# Patient Record
Sex: Female | Born: 1945
Health system: Southern US, Community
[De-identification: ages and names within clinical notes are randomized; demographics above are authoritative.]

## PROBLEM LIST (undated history)

## (undated) ENCOUNTER — Emergency Department (HOSPITAL_COMMUNITY): Admission: EM | Payer: Medicare Other

## (undated) DIAGNOSIS — Z87442 Personal history of urinary calculi: Secondary | ICD-10-CM

## (undated) DIAGNOSIS — I10 Essential (primary) hypertension: Secondary | ICD-10-CM

## (undated) DIAGNOSIS — R0600 Dyspnea, unspecified: Secondary | ICD-10-CM

## (undated) DIAGNOSIS — F17200 Nicotine dependence, unspecified, uncomplicated: Secondary | ICD-10-CM

## (undated) HISTORY — DX: Essential (primary) hypertension: I10

## (undated) HISTORY — PX: COLONOSCOPY: SHX174

## (undated) HISTORY — DX: Nicotine dependence, unspecified, uncomplicated: F17.200

## (undated) HISTORY — PX: OTHER SURGICAL HISTORY: SHX169

## (undated) HISTORY — PX: PILONIDAL CYST EXCISION: SHX744

---

## 1998-04-28 ENCOUNTER — Ambulatory Visit (HOSPITAL_COMMUNITY): Admission: RE | Admit: 1998-04-28 | Discharge: 1998-04-28 | Payer: Self-pay | Admitting: Family Medicine

## 2001-03-03 ENCOUNTER — Ambulatory Visit (HOSPITAL_COMMUNITY): Admission: RE | Admit: 2001-03-03 | Discharge: 2001-03-03 | Payer: Self-pay | Admitting: Family Medicine

## 2001-03-03 ENCOUNTER — Encounter: Payer: Self-pay | Admitting: Family Medicine

## 2002-03-06 ENCOUNTER — Encounter: Payer: Self-pay | Admitting: Family Medicine

## 2002-03-06 ENCOUNTER — Ambulatory Visit (HOSPITAL_COMMUNITY): Admission: RE | Admit: 2002-03-06 | Discharge: 2002-03-06 | Payer: Self-pay | Admitting: Family Medicine

## 2002-03-13 ENCOUNTER — Other Ambulatory Visit: Admission: RE | Admit: 2002-03-13 | Discharge: 2002-03-13 | Payer: Self-pay | Admitting: Family Medicine

## 2010-08-19 ENCOUNTER — Encounter: Admission: RE | Admit: 2010-08-19 | Discharge: 2010-08-19 | Payer: Self-pay | Admitting: Family Medicine

## 2012-12-19 ENCOUNTER — Other Ambulatory Visit: Payer: Self-pay | Admitting: Family Medicine

## 2012-12-19 ENCOUNTER — Ambulatory Visit
Admission: RE | Admit: 2012-12-19 | Discharge: 2012-12-19 | Disposition: A | Discharge status: Home / Self Care | Payer: Medicare Other | Source: Ambulatory Visit | Attending: Family Medicine | Admitting: Family Medicine

## 2012-12-19 DIAGNOSIS — R109 Unspecified abdominal pain: Secondary | ICD-10-CM

## 2012-12-19 MED ORDER — IOHEXOL 300 MG/ML  SOLN
100.0000 mL | Freq: Once | INTRAMUSCULAR | Status: AC | PRN
  Administered 2012-12-19: 100 mL via INTRAVENOUS

## 2012-12-19 MED ORDER — IOHEXOL 300 MG/ML  SOLN
30.0000 mL | Freq: Once | INTRAMUSCULAR | Status: AC | PRN
  Administered 2012-12-19: 30 mL via ORAL

## 2012-12-22 ENCOUNTER — Other Ambulatory Visit: Payer: Medicare Other

## 2012-12-25 ENCOUNTER — Ambulatory Visit
Admission: RE | Admit: 2012-12-25 | Discharge: 2012-12-25 | Disposition: A | Discharge status: Home / Self Care | Payer: Medicare Other | Source: Ambulatory Visit | Attending: Family Medicine | Admitting: Family Medicine

## 2012-12-25 ENCOUNTER — Other Ambulatory Visit: Payer: Self-pay | Admitting: Family Medicine

## 2013-01-09 ENCOUNTER — Ambulatory Visit (HOSPITAL_COMMUNITY)
Admission: RE | Admit: 2013-01-09 | Discharge: 2013-01-09 | Disposition: A | Discharge status: Home / Self Care | Payer: Medicare Other | Source: Ambulatory Visit | Attending: Urology | Admitting: Urology

## 2013-01-09 ENCOUNTER — Other Ambulatory Visit (HOSPITAL_COMMUNITY): Payer: Self-pay | Admitting: Urology

## 2013-01-09 DIAGNOSIS — R9389 Abnormal findings on diagnostic imaging of other specified body structures: Secondary | ICD-10-CM | POA: Insufficient documentation

## 2013-01-09 DIAGNOSIS — N201 Calculus of ureter: Secondary | ICD-10-CM

## 2013-04-10 ENCOUNTER — Other Ambulatory Visit (HOSPITAL_COMMUNITY): Payer: Self-pay | Admitting: Family Medicine

## 2013-04-10 DIAGNOSIS — R911 Solitary pulmonary nodule: Secondary | ICD-10-CM

## 2013-04-16 ENCOUNTER — Encounter (HOSPITAL_COMMUNITY)
Admission: RE | Admit: 2013-04-16 | Discharge: 2013-04-16 | Disposition: A | Discharge status: Home / Self Care | Payer: Medicare Other | Source: Ambulatory Visit | Attending: Family Medicine | Admitting: Family Medicine

## 2013-04-16 DIAGNOSIS — R911 Solitary pulmonary nodule: Secondary | ICD-10-CM

## 2013-04-16 DIAGNOSIS — J984 Other disorders of lung: Secondary | ICD-10-CM | POA: Insufficient documentation

## 2013-04-16 MED ORDER — FLUDEOXYGLUCOSE F - 18 (FDG) INJECTION
15.8000 | Freq: Once | INTRAVENOUS | Status: AC | PRN
  Administered 2013-04-16: 15.8 via INTRAVENOUS

## 2014-03-05 ENCOUNTER — Other Ambulatory Visit: Payer: Self-pay | Admitting: Family Medicine

## 2014-03-05 DIAGNOSIS — R918 Other nonspecific abnormal finding of lung field: Secondary | ICD-10-CM

## 2014-03-07 ENCOUNTER — Other Ambulatory Visit: Payer: Self-pay | Admitting: Family Medicine

## 2014-03-07 ENCOUNTER — Ambulatory Visit
Admission: RE | Admit: 2014-03-07 | Discharge: 2014-03-07 | Disposition: A | Discharge status: Home / Self Care | Payer: Medicare Other | Source: Ambulatory Visit | Attending: Family Medicine | Admitting: Family Medicine

## 2014-03-07 ENCOUNTER — Other Ambulatory Visit: Payer: Medicare Other

## 2014-03-07 DIAGNOSIS — R918 Other nonspecific abnormal finding of lung field: Secondary | ICD-10-CM

## 2014-11-13 DIAGNOSIS — R312 Other microscopic hematuria: Secondary | ICD-10-CM | POA: Diagnosis not present

## 2014-11-13 DIAGNOSIS — N200 Calculus of kidney: Secondary | ICD-10-CM | POA: Diagnosis not present

## 2014-12-21 DIAGNOSIS — I10 Essential (primary) hypertension: Secondary | ICD-10-CM | POA: Diagnosis not present

## 2014-12-21 DIAGNOSIS — M545 Low back pain: Secondary | ICD-10-CM | POA: Diagnosis not present

## 2014-12-21 DIAGNOSIS — F064 Anxiety disorder due to known physiological condition: Secondary | ICD-10-CM | POA: Diagnosis not present

## 2014-12-21 DIAGNOSIS — E6609 Other obesity due to excess calories: Secondary | ICD-10-CM | POA: Diagnosis not present

## 2014-12-30 DIAGNOSIS — M546 Pain in thoracic spine: Secondary | ICD-10-CM | POA: Diagnosis not present

## 2014-12-30 DIAGNOSIS — M25561 Pain in right knee: Secondary | ICD-10-CM | POA: Diagnosis not present

## 2015-01-13 DIAGNOSIS — F064 Anxiety disorder due to known physiological condition: Secondary | ICD-10-CM | POA: Diagnosis not present

## 2015-01-13 DIAGNOSIS — E789 Disorder of lipoprotein metabolism, unspecified: Secondary | ICD-10-CM | POA: Diagnosis not present

## 2015-01-13 DIAGNOSIS — I10 Essential (primary) hypertension: Secondary | ICD-10-CM | POA: Diagnosis not present

## 2015-01-13 DIAGNOSIS — E6609 Other obesity due to excess calories: Secondary | ICD-10-CM | POA: Diagnosis not present

## 2015-04-04 DIAGNOSIS — D485 Neoplasm of uncertain behavior of skin: Secondary | ICD-10-CM | POA: Diagnosis not present

## 2015-04-04 DIAGNOSIS — L820 Inflamed seborrheic keratosis: Secondary | ICD-10-CM | POA: Diagnosis not present

## 2015-04-14 DIAGNOSIS — Z0000 Encounter for general adult medical examination without abnormal findings: Secondary | ICD-10-CM | POA: Diagnosis not present

## 2015-07-03 DIAGNOSIS — Z1231 Encounter for screening mammogram for malignant neoplasm of breast: Secondary | ICD-10-CM | POA: Diagnosis not present

## 2015-12-02 DIAGNOSIS — Z0000 Encounter for general adult medical examination without abnormal findings: Secondary | ICD-10-CM | POA: Diagnosis not present

## 2015-12-02 DIAGNOSIS — N200 Calculus of kidney: Secondary | ICD-10-CM | POA: Diagnosis not present

## 2015-12-11 DIAGNOSIS — I10 Essential (primary) hypertension: Secondary | ICD-10-CM | POA: Diagnosis not present

## 2015-12-11 DIAGNOSIS — E089 Diabetes mellitus due to underlying condition without complications: Secondary | ICD-10-CM | POA: Diagnosis not present

## 2015-12-12 DIAGNOSIS — E1322 Other specified diabetes mellitus with diabetic chronic kidney disease: Secondary | ICD-10-CM | POA: Diagnosis not present

## 2015-12-12 DIAGNOSIS — E782 Mixed hyperlipidemia: Secondary | ICD-10-CM | POA: Diagnosis not present

## 2015-12-12 DIAGNOSIS — I10 Essential (primary) hypertension: Secondary | ICD-10-CM | POA: Diagnosis not present

## 2015-12-15 DIAGNOSIS — E669 Obesity, unspecified: Secondary | ICD-10-CM | POA: Diagnosis not present

## 2015-12-15 DIAGNOSIS — I10 Essential (primary) hypertension: Secondary | ICD-10-CM | POA: Diagnosis not present

## 2015-12-26 DIAGNOSIS — I10 Essential (primary) hypertension: Secondary | ICD-10-CM | POA: Diagnosis not present

## 2016-04-14 DIAGNOSIS — E089 Diabetes mellitus due to underlying condition without complications: Secondary | ICD-10-CM | POA: Diagnosis not present

## 2016-04-14 DIAGNOSIS — Z0000 Encounter for general adult medical examination without abnormal findings: Secondary | ICD-10-CM | POA: Diagnosis not present

## 2016-04-14 DIAGNOSIS — I10 Essential (primary) hypertension: Secondary | ICD-10-CM | POA: Diagnosis not present

## 2016-05-24 DIAGNOSIS — N200 Calculus of kidney: Secondary | ICD-10-CM | POA: Diagnosis not present

## 2016-05-31 DIAGNOSIS — N200 Calculus of kidney: Secondary | ICD-10-CM | POA: Diagnosis not present

## 2016-07-01 DIAGNOSIS — H538 Other visual disturbances: Secondary | ICD-10-CM | POA: Diagnosis not present

## 2016-07-01 DIAGNOSIS — Z961 Presence of intraocular lens: Secondary | ICD-10-CM | POA: Diagnosis not present

## 2016-07-01 DIAGNOSIS — H2512 Age-related nuclear cataract, left eye: Secondary | ICD-10-CM | POA: Diagnosis not present

## 2016-08-25 DIAGNOSIS — Z23 Encounter for immunization: Secondary | ICD-10-CM | POA: Diagnosis not present

## 2016-08-25 DIAGNOSIS — I10 Essential (primary) hypertension: Secondary | ICD-10-CM | POA: Diagnosis not present

## 2016-08-25 DIAGNOSIS — E089 Diabetes mellitus due to underlying condition without complications: Secondary | ICD-10-CM | POA: Diagnosis not present

## 2016-09-22 DIAGNOSIS — Z1231 Encounter for screening mammogram for malignant neoplasm of breast: Secondary | ICD-10-CM | POA: Diagnosis not present

## 2016-09-22 DIAGNOSIS — Z803 Family history of malignant neoplasm of breast: Secondary | ICD-10-CM | POA: Diagnosis not present

## 2016-12-28 DIAGNOSIS — I10 Essential (primary) hypertension: Secondary | ICD-10-CM | POA: Diagnosis not present

## 2016-12-28 DIAGNOSIS — E7800 Pure hypercholesterolemia, unspecified: Secondary | ICD-10-CM | POA: Diagnosis not present

## 2016-12-28 DIAGNOSIS — E1122 Type 2 diabetes mellitus with diabetic chronic kidney disease: Secondary | ICD-10-CM | POA: Diagnosis not present

## 2016-12-28 DIAGNOSIS — E785 Hyperlipidemia, unspecified: Secondary | ICD-10-CM | POA: Diagnosis not present

## 2016-12-28 DIAGNOSIS — E1100 Type 2 diabetes mellitus with hyperosmolarity without nonketotic hyperglycemic-hyperosmolar coma (NKHHC): Secondary | ICD-10-CM | POA: Diagnosis not present

## 2016-12-28 DIAGNOSIS — Z0000 Encounter for general adult medical examination without abnormal findings: Secondary | ICD-10-CM | POA: Diagnosis not present

## 2017-01-19 ENCOUNTER — Ambulatory Visit (INDEPENDENT_AMBULATORY_CARE_PROVIDER_SITE_OTHER): Payer: Medicare Other

## 2017-01-19 ENCOUNTER — Ambulatory Visit (INDEPENDENT_AMBULATORY_CARE_PROVIDER_SITE_OTHER): Payer: Medicare Other | Admitting: Podiatry

## 2017-01-19 DIAGNOSIS — M2031 Hallux varus (acquired), right foot: Secondary | ICD-10-CM | POA: Diagnosis not present

## 2017-01-19 DIAGNOSIS — M779 Enthesopathy, unspecified: Secondary | ICD-10-CM

## 2017-01-19 DIAGNOSIS — L600 Ingrowing nail: Secondary | ICD-10-CM | POA: Diagnosis not present

## 2017-01-19 DIAGNOSIS — S92424A Nondisplaced fracture of distal phalanx of right great toe, initial encounter for closed fracture: Secondary | ICD-10-CM

## 2017-01-20 NOTE — Progress Notes (Signed)
Subjective:     Patient ID: Tami Shea, female   DOB: 04-03-1946, 71 y.o.   MRN: [id number]  HPI 71 year old female presents to the office today for concerns of her right big toe pain which has been ongoing for the last several weeks. She states that she fell in February of this year and since then she had some pain to the to the toe.  She was putting ice on it and that helped. The pain is much improved but still some residual pain. She discussed with her PCP and was told she had an ingrown toenail. Denies any surrounding redness, drainage, or other signs of infection. No other complaints today.   Review of Systems  All other systems reviewed and are negative.      Objective:   Physical Exam General: AAO x3, NAD  Dermatological: Incurvation of the medial nail border but there is no redness, drainage, signs of infection. No pain to the area. No open lesions.   Vascular: Dorsalis Pedis artery and Posterior Tibial artery pedal pulses are 2/4 bilateral with immedate capillary fill time. There is no pain with calf compression, swelling, warmth, erythema.   Neruologic: Grossly intact via light touch bilateral. Vibratory intact via tuning fork bilateral. Protective threshold with Semmes Wienstein monofilament intact to all pedal sites bilateral.  Musculoskeletal: There is mild hallux malleolus present to the right hallux. There is tenderness along the dorsal IPJ, mostly lateral. Trace edema. No erythema or increase in warmth. No other area of pinpoint tenderness.  Muscular strength 5/5 in all groups tested bilateral.  Gait: Unassisted, Nonantalgic.      Assessment:     71 year old female with right hallux possible avulsion fracture; asymptomatic ingrown toenail.     Plan:     -Treatment options discussed including all alternatives, risks, and complications -Etiology of symptoms were discussed -X-rays were obtained and reviewed with the patient. On the lateral view there does appear to be  a small avulsion fracture at the IPJ.  -At this time her symptoms are much improved. Recommended to continue with ice and a stiffer sole shoe.  -Medial nail border was debrided without complications or bleeding.  -RTC in 3-4 weeks if symptoms are not resolved or sooner if needed.   Celesta Gentile, DPM

## 2017-02-11 ENCOUNTER — Ambulatory Visit: Payer: Medicare Other | Admitting: Podiatry

## 2017-04-01 DIAGNOSIS — N200 Calculus of kidney: Secondary | ICD-10-CM | POA: Diagnosis not present

## 2017-04-08 DIAGNOSIS — N200 Calculus of kidney: Secondary | ICD-10-CM | POA: Diagnosis not present

## 2017-06-08 DIAGNOSIS — I10 Essential (primary) hypertension: Secondary | ICD-10-CM | POA: Diagnosis not present

## 2017-06-08 DIAGNOSIS — E785 Hyperlipidemia, unspecified: Secondary | ICD-10-CM | POA: Diagnosis not present

## 2017-06-08 DIAGNOSIS — E1100 Type 2 diabetes mellitus with hyperosmolarity without nonketotic hyperglycemic-hyperosmolar coma (NKHHC): Secondary | ICD-10-CM | POA: Diagnosis not present

## 2017-11-02 DIAGNOSIS — I10 Essential (primary) hypertension: Secondary | ICD-10-CM | POA: Diagnosis not present

## 2017-12-26 ENCOUNTER — Ambulatory Visit (INDEPENDENT_AMBULATORY_CARE_PROVIDER_SITE_OTHER): Payer: Medicare Other

## 2017-12-26 ENCOUNTER — Ambulatory Visit: Payer: Medicare Other | Admitting: Podiatry

## 2017-12-26 ENCOUNTER — Encounter: Payer: Self-pay | Admitting: Podiatry

## 2017-12-26 DIAGNOSIS — M795 Residual foreign body in soft tissue: Secondary | ICD-10-CM

## 2017-12-26 DIAGNOSIS — S90851A Superficial foreign body, right foot, initial encounter: Secondary | ICD-10-CM | POA: Diagnosis not present

## 2017-12-26 DIAGNOSIS — B351 Tinea unguium: Secondary | ICD-10-CM

## 2017-12-26 DIAGNOSIS — M79675 Pain in left toe(s): Secondary | ICD-10-CM | POA: Diagnosis not present

## 2017-12-26 DIAGNOSIS — M79674 Pain in right toe(s): Secondary | ICD-10-CM

## 2017-12-26 MED ORDER — CEPHALEXIN 500 MG PO CAPS: 500.0000 mg | ORAL_CAPSULE | Freq: Three times a day (TID) | ORAL | 0 refills | Status: DC

## 2017-12-26 NOTE — Patient Instructions (Signed)
Soak Instructions    [name] AFTER THE PROCEDURE  Place 1/4 cup of epsom salts in a quart of warm tap water.  Submerge your foot or feet with outer bandage intact for the initial soak; this will allow the bandage to become moist and wet for easy lift off.  Once you remove your bandage, continue to soak in the solution for 20 minutes.  This soak should be done twice a Tami Shea.  Next, remove your foot or feet from solution, blot dry the affected area and cover.  You may use a band aid large enough to cover the area or use gauze and tape.  Apply other medications to the area as directed by the doctor such as polysporin neosporin.  IF YOUR SKIN BECOMES IRRITATED WHILE USING THESE INSTRUCTIONS, IT IS OKAY TO SWITCH TO  WHITE VINEGAR AND WATER. Or you may use antibacterial soap and water to keep the toe clean  Monitor for any signs/symptoms of infection. Call the office immediately if any occur or go directly to the emergency room. Call with any questions/concerns.   

## 2017-12-27 NOTE — Progress Notes (Signed)
Subjective: 72 year old female presents the office today as an appointment for concerns of physical wounds in her left heel.  She states that a week ago she stepped on a broken piece of glass and she got a couple of pieces out and it felt better however over the weekend she started noticing increasing pain to the heel.  She follow-up with her primary care physician today and was referred.  She is not sure when her last tetanus was updated.  She denies any drainage or pus or swelling.  She also states that while she is at today's appointment her nails are thick and elongated cannot trim herself and they are causing discomfort.  Denies any redness or drainage or any swelling.  She has no other concerns today. Denies any systemic complaints such as fevers, chills, nausea, vomiting. No acute changes since last appointment, and no other complaints at this time.   Objective: AAO x3, NAD DP/PT pulses palpable bilaterally, CRT less than 3 seconds On the plantar aspect the left heel is evidence of a puncture wound unable to palpate a piece of glass.  There is trace edema there is no surrounding erythema, ascending cellulitis.  There is no fluctuation or crepitation.  There is no malodor.  Tenderness palpation to this area but no other areas of tenderness and no other areas of puncture wounds. Nails are hypertrophic, dystrophic, brittle, discolored, elongated 10. No surrounding redness or drainage. Tenderness nails 1-5 bilaterally. No open lesions or pre-ulcerative lesions are identified today.No pain with calf compression, swelling, warmth, erythema  Assessment: Foreign body left heel, symptomatic onychomycosis  Plan: -All treatment options discussed with the patient including all alternatives, risks, complications.  -X-rays were obtained and reviewed.  There is evidence of foreign body present to the plantar calcaneus appears to be superficial.  No evidence of acute fracture. -After verbal consent was  obtained I cleaned the area with Betadine.  I was easily able to identify a piece of glass.  Utilizing a 15 blade I was able to debride some of the hyperkeratotic tissue over the area.  The glass was then easily removed and measured approximately 0.4 cm.  There is no purulence or drainage.  The area was cleaned with alcohol and antibiotic ointment was applied followed by a bandage.  I want her to continue with this daily as well as Epsom salts soaks at home.  Also prescribed Keflex as a precaution.  Also recommended follow-up with primary care physician for updated tetanus if needed.  I will see her back in 2 weeks if needed or sooner if any issues are to arise.  Monitor for any signs or symptoms of infection.  She tolerated the procedure well any complications. -Nails sharp debrided x10 without any complications or bleeding -Patient encouraged to call the office with any questions, concerns, change in symptoms.   Trula Slade DPM

## 2018-01-09 ENCOUNTER — Ambulatory Visit: Payer: Medicare Other | Admitting: Podiatry

## 2018-01-31 DIAGNOSIS — R0981 Nasal congestion: Secondary | ICD-10-CM | POA: Diagnosis not present

## 2018-02-08 DIAGNOSIS — Z1231 Encounter for screening mammogram for malignant neoplasm of breast: Secondary | ICD-10-CM | POA: Diagnosis not present

## 2018-04-07 DIAGNOSIS — E1100 Type 2 diabetes mellitus with hyperosmolarity without nonketotic hyperglycemic-hyperosmolar coma (NKHHC): Secondary | ICD-10-CM | POA: Diagnosis not present

## 2018-04-07 DIAGNOSIS — E785 Hyperlipidemia, unspecified: Secondary | ICD-10-CM | POA: Diagnosis not present

## 2018-04-07 DIAGNOSIS — I10 Essential (primary) hypertension: Secondary | ICD-10-CM | POA: Diagnosis not present

## 2018-05-04 DIAGNOSIS — H538 Other visual disturbances: Secondary | ICD-10-CM | POA: Diagnosis not present

## 2018-05-04 DIAGNOSIS — Z961 Presence of intraocular lens: Secondary | ICD-10-CM | POA: Diagnosis not present

## 2018-05-04 DIAGNOSIS — H26491 Other secondary cataract, right eye: Secondary | ICD-10-CM | POA: Diagnosis not present

## 2018-05-04 DIAGNOSIS — H2512 Age-related nuclear cataract, left eye: Secondary | ICD-10-CM | POA: Diagnosis not present

## 2018-05-31 DIAGNOSIS — H538 Other visual disturbances: Secondary | ICD-10-CM | POA: Diagnosis not present

## 2018-05-31 DIAGNOSIS — H26491 Other secondary cataract, right eye: Secondary | ICD-10-CM | POA: Diagnosis not present

## 2018-05-31 DIAGNOSIS — Z961 Presence of intraocular lens: Secondary | ICD-10-CM | POA: Diagnosis not present

## 2018-05-31 DIAGNOSIS — H2512 Age-related nuclear cataract, left eye: Secondary | ICD-10-CM | POA: Diagnosis not present

## 2018-07-12 DIAGNOSIS — H26491 Other secondary cataract, right eye: Secondary | ICD-10-CM | POA: Diagnosis not present

## 2018-07-25 DIAGNOSIS — H2512 Age-related nuclear cataract, left eye: Secondary | ICD-10-CM | POA: Diagnosis not present

## 2018-09-14 DIAGNOSIS — I10 Essential (primary) hypertension: Secondary | ICD-10-CM | POA: Diagnosis not present

## 2018-09-14 DIAGNOSIS — E1100 Type 2 diabetes mellitus with hyperosmolarity without nonketotic hyperglycemic-hyperosmolar coma (NKHHC): Secondary | ICD-10-CM | POA: Diagnosis not present

## 2018-09-14 DIAGNOSIS — E782 Mixed hyperlipidemia: Secondary | ICD-10-CM | POA: Diagnosis not present

## 2018-09-15 ENCOUNTER — Other Ambulatory Visit: Payer: Self-pay

## 2018-09-15 NOTE — Patient Outreach (Signed)
[facility]) Care Management  09/15/2018  Jerita Wimbush Balla 24-Feb-1946 [id number]   Medication Adherence call to Mrs. Anjulie Mounger spoke with patient she is due on Rosuvastatin 20 mg, she had a doctor's appointment yesterday and doctor told patient she will send in a refill request to [facility] after the appointment .[facility] has not received a refill request ,call doctor's office and left a message for patient to call back patient was inform. Mrs. Bushway is showing past due under Christine.    [facility] Direct Dial [phone]  Fax [phone] [email or web address]

## 2019-01-01 DIAGNOSIS — I10 Essential (primary) hypertension: Secondary | ICD-10-CM | POA: Diagnosis not present

## 2019-01-01 DIAGNOSIS — Z0000 Encounter for general adult medical examination without abnormal findings: Secondary | ICD-10-CM | POA: Diagnosis not present

## 2019-01-01 DIAGNOSIS — J399 Disease of upper respiratory tract, unspecified: Secondary | ICD-10-CM | POA: Diagnosis not present

## 2019-01-01 DIAGNOSIS — R0981 Nasal congestion: Secondary | ICD-10-CM | POA: Diagnosis not present

## 2019-07-09 DIAGNOSIS — I10 Essential (primary) hypertension: Secondary | ICD-10-CM | POA: Diagnosis not present

## 2019-07-09 DIAGNOSIS — E1169 Type 2 diabetes mellitus with other specified complication: Secondary | ICD-10-CM | POA: Diagnosis not present

## 2019-08-10 DIAGNOSIS — Z803 Family history of malignant neoplasm of breast: Secondary | ICD-10-CM | POA: Diagnosis not present

## 2019-08-10 DIAGNOSIS — Z1231 Encounter for screening mammogram for malignant neoplasm of breast: Secondary | ICD-10-CM | POA: Diagnosis not present

## 2019-10-08 DIAGNOSIS — E785 Hyperlipidemia, unspecified: Secondary | ICD-10-CM | POA: Diagnosis not present

## 2019-10-08 DIAGNOSIS — Z23 Encounter for immunization: Secondary | ICD-10-CM | POA: Diagnosis not present

## 2019-10-08 DIAGNOSIS — I10 Essential (primary) hypertension: Secondary | ICD-10-CM | POA: Diagnosis not present

## 2019-10-08 DIAGNOSIS — M130 Polyarthritis, unspecified: Secondary | ICD-10-CM | POA: Diagnosis not present

## 2019-10-15 DIAGNOSIS — H6122 Impacted cerumen, left ear: Secondary | ICD-10-CM | POA: Diagnosis not present

## 2019-11-05 DIAGNOSIS — K5900 Constipation, unspecified: Secondary | ICD-10-CM | POA: Diagnosis not present

## 2019-11-05 DIAGNOSIS — I10 Essential (primary) hypertension: Secondary | ICD-10-CM | POA: Diagnosis not present

## 2019-11-05 DIAGNOSIS — Z7189 Other specified counseling: Secondary | ICD-10-CM | POA: Diagnosis not present

## 2019-11-05 DIAGNOSIS — R10811 Right upper quadrant abdominal tenderness: Secondary | ICD-10-CM | POA: Diagnosis not present

## 2019-11-12 ENCOUNTER — Other Ambulatory Visit: Payer: Self-pay | Admitting: Nurse Practitioner

## 2019-11-12 DIAGNOSIS — R10811 Right upper quadrant abdominal tenderness: Secondary | ICD-10-CM

## 2019-11-12 DIAGNOSIS — K5900 Constipation, unspecified: Secondary | ICD-10-CM

## 2019-11-19 DIAGNOSIS — I10 Essential (primary) hypertension: Secondary | ICD-10-CM | POA: Diagnosis not present

## 2019-11-21 ENCOUNTER — Ambulatory Visit
Admission: RE | Admit: 2019-11-21 | Discharge: 2019-11-21 | Disposition: A | Discharge status: Home / Self Care | Payer: Medicare Other | Source: Ambulatory Visit | Attending: Nurse Practitioner | Admitting: Nurse Practitioner

## 2019-11-21 DIAGNOSIS — K5900 Constipation, unspecified: Secondary | ICD-10-CM

## 2019-11-21 DIAGNOSIS — R1011 Right upper quadrant pain: Secondary | ICD-10-CM | POA: Diagnosis not present

## 2019-11-21 DIAGNOSIS — R10811 Right upper quadrant abdominal tenderness: Secondary | ICD-10-CM

## 2019-12-10 DIAGNOSIS — I10 Essential (primary) hypertension: Secondary | ICD-10-CM | POA: Diagnosis not present

## 2019-12-10 DIAGNOSIS — K5900 Constipation, unspecified: Secondary | ICD-10-CM | POA: Diagnosis not present

## 2020-01-04 ENCOUNTER — Ambulatory Visit: Payer: Medicare Other | Attending: Internal Medicine

## 2020-01-04 DIAGNOSIS — Z23 Encounter for immunization: Secondary | ICD-10-CM | POA: Insufficient documentation

## 2020-01-04 NOTE — Progress Notes (Signed)
   Covid-19 Vaccination Clinic  Name:  Tami Shea    MRN: [id number] DOB: Jun 06, 1946  01/04/2020  [name] was observed post Covid-19 immunization for 15 minutes without incidence. She was provided with Vaccine Information Sheet and instruction to access the V-Safe system.   [name] was instructed to call 911 with any severe reactions post vaccine: [name] Kitchen Difficulty breathing  . Swelling of your face and throat  . A fast heartbeat  . A bad rash all over your body  . Dizziness and weakness    Immunizations Administered    Name Date Dose VIS Date Route   Pfizer COVID-19 Vaccine 01/04/2020  1:06 PM 0.3 mL 10/19/2019 Intramuscular   Manufacturer: [place]   Lot: [id number]   [place]: [id number]

## 2020-01-11 DIAGNOSIS — I10 Essential (primary) hypertension: Secondary | ICD-10-CM | POA: Diagnosis not present

## 2020-01-11 DIAGNOSIS — E785 Hyperlipidemia, unspecified: Secondary | ICD-10-CM | POA: Diagnosis not present

## 2020-01-11 DIAGNOSIS — E1169 Type 2 diabetes mellitus with other specified complication: Secondary | ICD-10-CM | POA: Diagnosis not present

## 2020-01-11 DIAGNOSIS — M130 Polyarthritis, unspecified: Secondary | ICD-10-CM | POA: Diagnosis not present

## 2020-01-29 ENCOUNTER — Ambulatory Visit: Payer: Medicare Other | Attending: Internal Medicine

## 2020-01-29 DIAGNOSIS — Z23 Encounter for immunization: Secondary | ICD-10-CM

## 2020-01-29 NOTE — Progress Notes (Signed)
   Covid-19 Vaccination Clinic  Name:  Tami Shea    MRN: [id number] DOB: 12-23-1945  01/29/2020  Ms. Tami Shea was observed post Covid-19 immunization for 15 minutes without incident. She was provided with Vaccine Information Sheet and instruction to access the V-Safe system.   Ms. Tami Shea was instructed to call 911 with any severe reactions post vaccine: [name] Kitchen Difficulty breathing  . Swelling of face and throat  . A fast heartbeat  . A bad rash all over body  . Dizziness and weakness   Immunizations Administered    Name Date Dose VIS Date Route   Pfizer COVID-19 Vaccine 01/29/2020  3:47 PM 0.3 mL 10/19/2019 Intramuscular   Manufacturer: [place]   Lot: [id number]   [name]: [id number]

## 2020-03-07 DIAGNOSIS — E7849 Other hyperlipidemia: Secondary | ICD-10-CM | POA: Diagnosis not present

## 2020-03-07 DIAGNOSIS — I10 Essential (primary) hypertension: Secondary | ICD-10-CM | POA: Diagnosis not present

## 2020-05-13 DIAGNOSIS — E064 Drug-induced thyroiditis: Secondary | ICD-10-CM | POA: Diagnosis not present

## 2020-05-13 DIAGNOSIS — E785 Hyperlipidemia, unspecified: Secondary | ICD-10-CM | POA: Diagnosis not present

## 2020-05-13 DIAGNOSIS — I10 Essential (primary) hypertension: Secondary | ICD-10-CM | POA: Diagnosis not present

## 2020-07-17 DIAGNOSIS — H43811 Vitreous degeneration, right eye: Secondary | ICD-10-CM | POA: Diagnosis not present

## 2020-07-17 DIAGNOSIS — H2512 Age-related nuclear cataract, left eye: Secondary | ICD-10-CM | POA: Diagnosis not present

## 2020-07-17 DIAGNOSIS — H538 Other visual disturbances: Secondary | ICD-10-CM | POA: Diagnosis not present

## 2020-07-17 DIAGNOSIS — Z961 Presence of intraocular lens: Secondary | ICD-10-CM | POA: Diagnosis not present

## 2020-08-21 DIAGNOSIS — Z1159 Encounter for screening for other viral diseases: Secondary | ICD-10-CM | POA: Diagnosis not present

## 2020-08-27 DIAGNOSIS — K5730 Diverticulosis of large intestine without perforation or abscess without bleeding: Secondary | ICD-10-CM | POA: Diagnosis not present

## 2020-08-27 DIAGNOSIS — D125 Benign neoplasm of sigmoid colon: Secondary | ICD-10-CM | POA: Diagnosis not present

## 2020-08-27 DIAGNOSIS — D124 Benign neoplasm of descending colon: Secondary | ICD-10-CM | POA: Diagnosis not present

## 2020-08-27 DIAGNOSIS — Z1211 Encounter for screening for malignant neoplasm of colon: Secondary | ICD-10-CM | POA: Diagnosis not present

## 2020-08-27 DIAGNOSIS — D122 Benign neoplasm of ascending colon: Secondary | ICD-10-CM | POA: Diagnosis not present

## 2020-08-27 DIAGNOSIS — D123 Benign neoplasm of transverse colon: Secondary | ICD-10-CM | POA: Diagnosis not present

## 2020-08-29 DIAGNOSIS — D122 Benign neoplasm of ascending colon: Secondary | ICD-10-CM | POA: Diagnosis not present

## 2020-08-29 DIAGNOSIS — D123 Benign neoplasm of transverse colon: Secondary | ICD-10-CM | POA: Diagnosis not present

## 2020-08-29 DIAGNOSIS — D125 Benign neoplasm of sigmoid colon: Secondary | ICD-10-CM | POA: Diagnosis not present

## 2020-08-29 DIAGNOSIS — D124 Benign neoplasm of descending colon: Secondary | ICD-10-CM | POA: Diagnosis not present

## 2020-09-15 DIAGNOSIS — I10 Essential (primary) hypertension: Secondary | ICD-10-CM | POA: Diagnosis not present

## 2020-09-15 DIAGNOSIS — E1100 Type 2 diabetes mellitus with hyperosmolarity without nonketotic hyperglycemic-hyperosmolar coma (NKHHC): Secondary | ICD-10-CM | POA: Diagnosis not present

## 2020-09-15 DIAGNOSIS — E782 Mixed hyperlipidemia: Secondary | ICD-10-CM | POA: Diagnosis not present

## 2020-11-12 IMAGING — US US ABDOMEN LIMITED
1 series · 14 of 25 positions shown · non-contrast
Comparison: None.

CLINICAL DATA: Right upper quadrant pain and tenderness for 2
weeks.

EXAM:
ULTRASOUND ABDOMEN LIMITED RIGHT UPPER QUADRANT

[Series 1: us abdomen limited · 0.14mm/px · 14 of 46 slices shown]
[im 1/46]
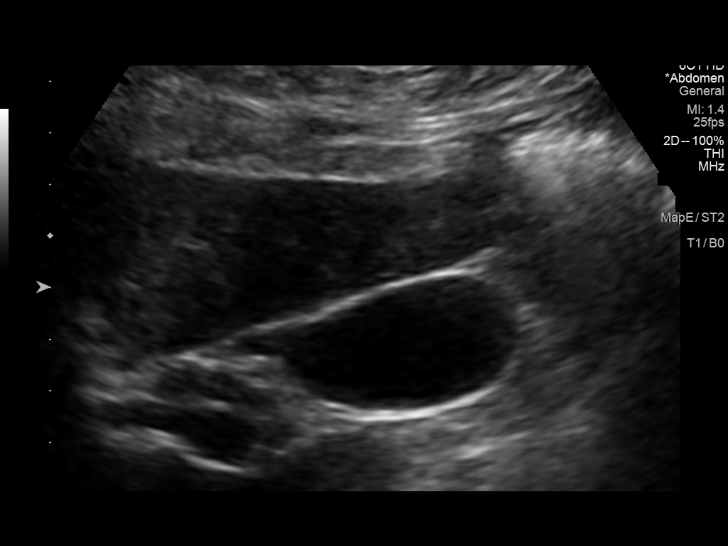
[im 4/46]
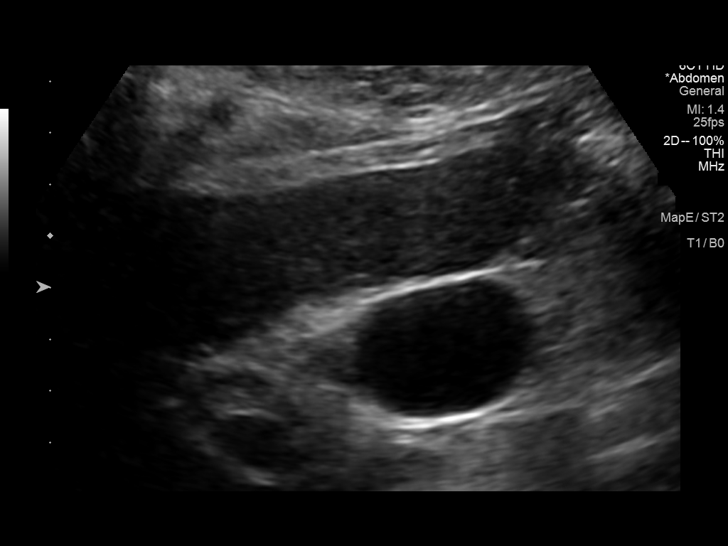
[im 8/46]
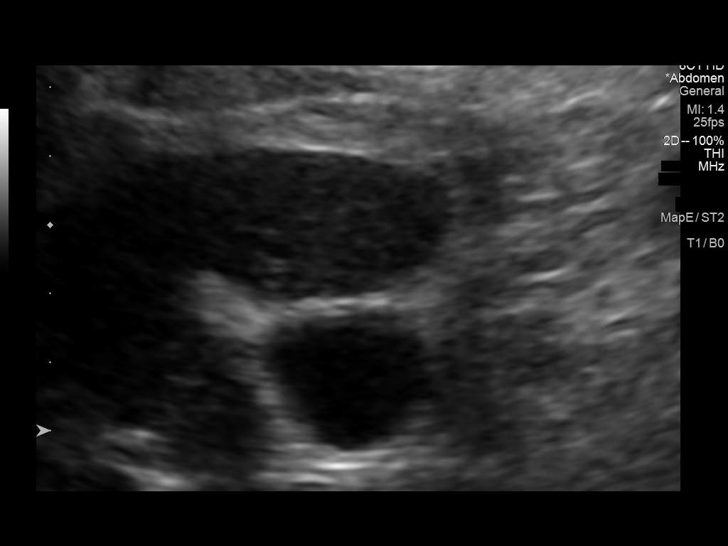
[im 12/46]
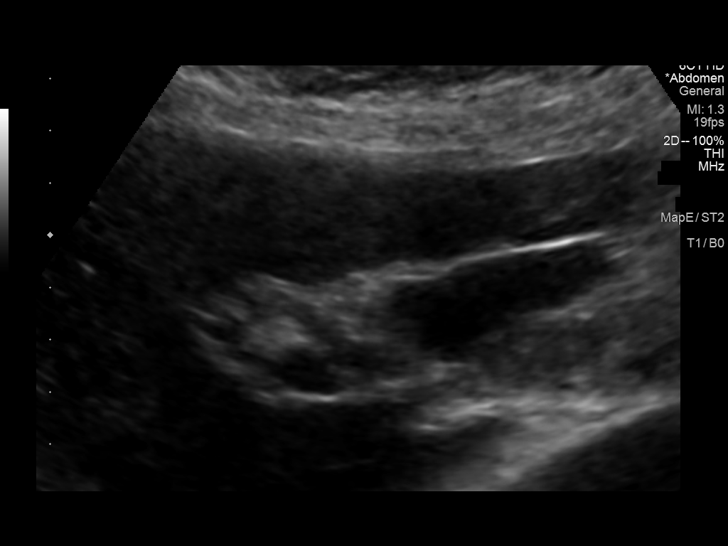
[im 16/46]
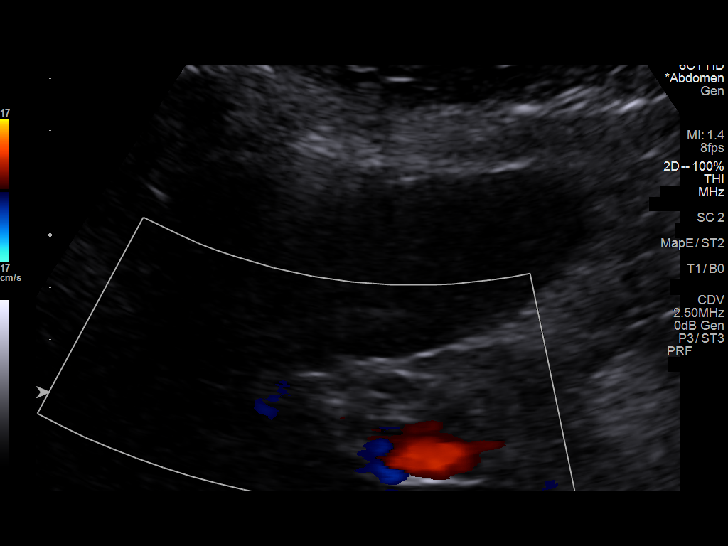
[im 17/46]
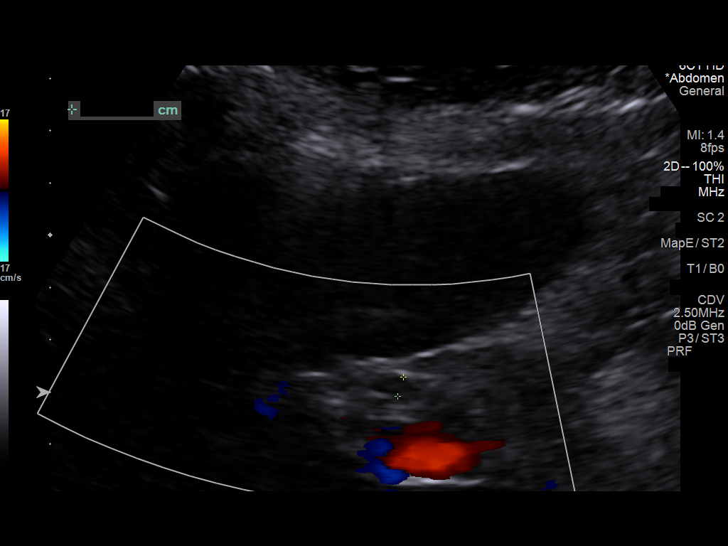
[im 21/46]
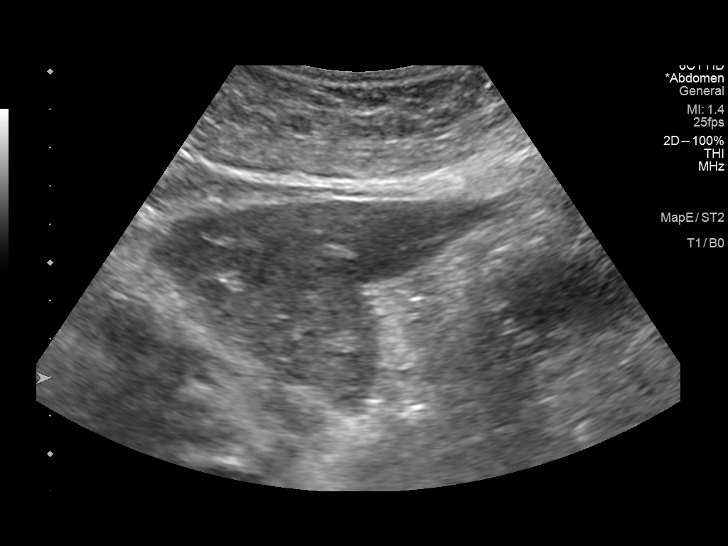
[im 25/46]
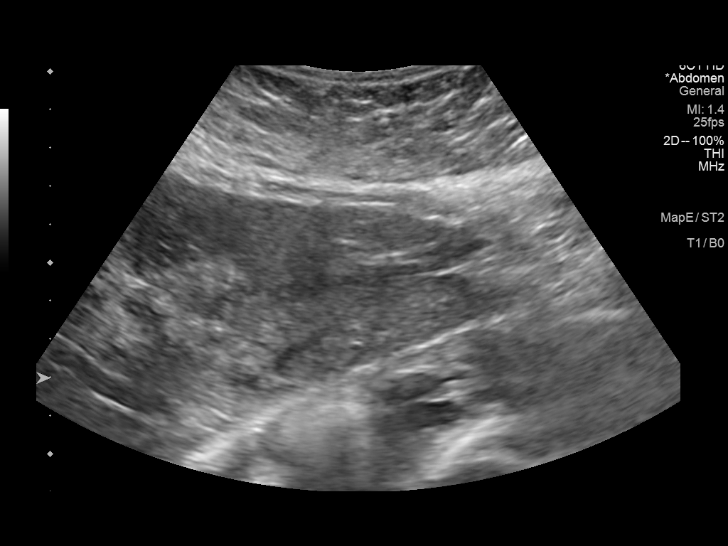
[im 29/46]
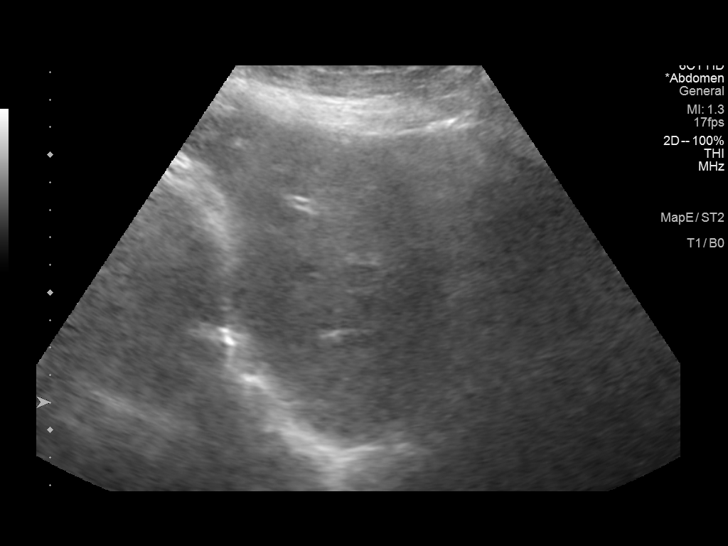
[im 31/46]
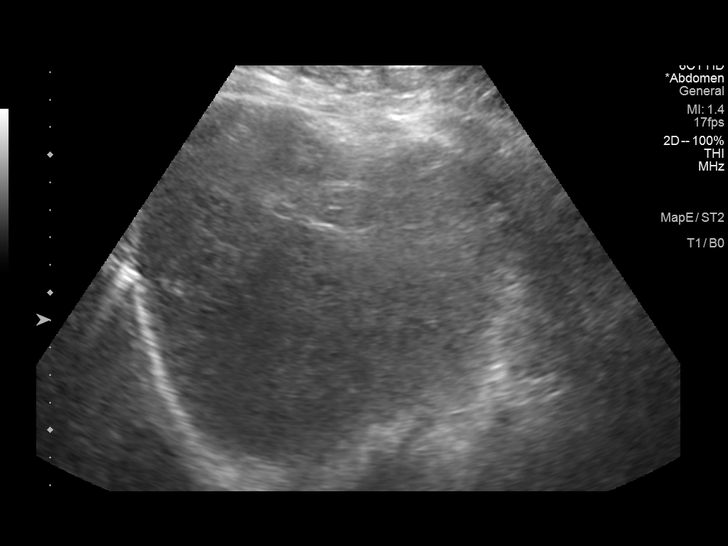
[im 34/46]
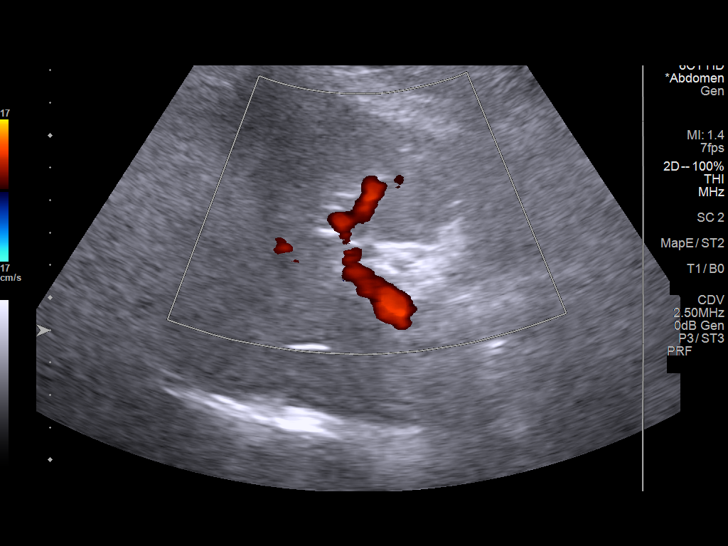
[im 38/46]
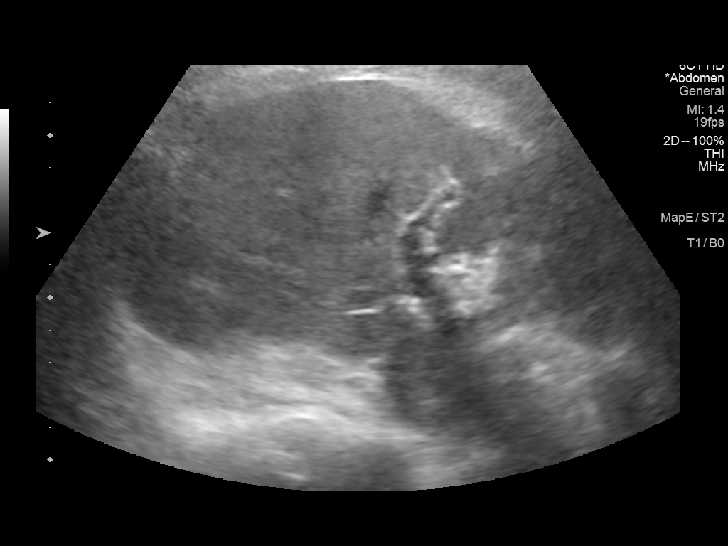
[im 42/46]
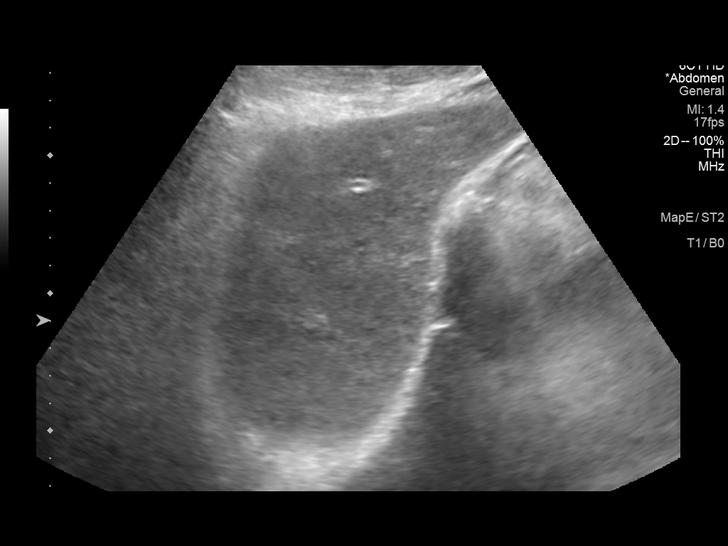
[im 46/46]
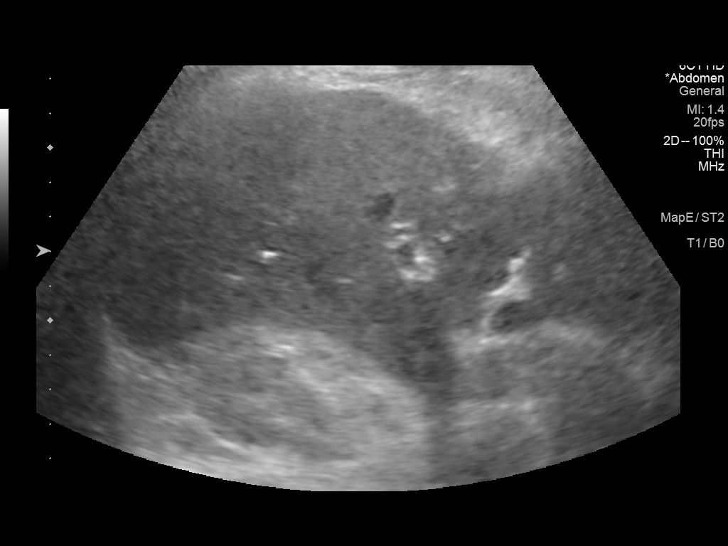

[14 of 25 positions shown; findings below may reference images not displayed]

FINDINGS: Gallbladder:

No gallstones or wall thickening visualized. No sonographic Murphy
sign noted by sonographer.

Common bile duct:

Diameter: 4 mm, within normal limits.

Liver:

No focal lesion identified. Within normal limits in parenchymal
echogenicity. Portal vein is patent on color Doppler imaging with
normal direction of blood flow towards the liver.

Other: None.
IMPRESSION: Negative.  No hepatobiliary abnormality identified.

## 2021-01-20 DIAGNOSIS — Z1231 Encounter for screening mammogram for malignant neoplasm of breast: Secondary | ICD-10-CM | POA: Diagnosis not present

## 2021-02-09 DIAGNOSIS — I10 Essential (primary) hypertension: Secondary | ICD-10-CM | POA: Diagnosis not present

## 2021-02-09 DIAGNOSIS — E1169 Type 2 diabetes mellitus with other specified complication: Secondary | ICD-10-CM | POA: Diagnosis not present

## 2021-02-09 DIAGNOSIS — Z0000 Encounter for general adult medical examination without abnormal findings: Secondary | ICD-10-CM | POA: Diagnosis not present

## 2021-07-15 DIAGNOSIS — Z135 Encounter for screening for eye and ear disorders: Secondary | ICD-10-CM | POA: Diagnosis not present

## 2021-08-14 DIAGNOSIS — M130 Polyarthritis, unspecified: Secondary | ICD-10-CM | POA: Diagnosis not present

## 2021-08-14 DIAGNOSIS — I10 Essential (primary) hypertension: Secondary | ICD-10-CM | POA: Diagnosis not present

## 2021-08-14 DIAGNOSIS — E1169 Type 2 diabetes mellitus with other specified complication: Secondary | ICD-10-CM | POA: Diagnosis not present

## 2021-08-14 DIAGNOSIS — E7849 Other hyperlipidemia: Secondary | ICD-10-CM | POA: Diagnosis not present

## 2021-08-14 DIAGNOSIS — R413 Other amnesia: Secondary | ICD-10-CM | POA: Diagnosis not present

## 2021-08-14 DIAGNOSIS — E559 Vitamin D deficiency, unspecified: Secondary | ICD-10-CM | POA: Diagnosis not present

## 2021-08-28 DIAGNOSIS — I10 Essential (primary) hypertension: Secondary | ICD-10-CM | POA: Diagnosis not present

## 2021-08-28 DIAGNOSIS — K5900 Constipation, unspecified: Secondary | ICD-10-CM | POA: Diagnosis not present

## 2021-08-28 DIAGNOSIS — E7800 Pure hypercholesterolemia, unspecified: Secondary | ICD-10-CM | POA: Diagnosis not present

## 2021-10-12 ENCOUNTER — Ambulatory Visit (INDEPENDENT_AMBULATORY_CARE_PROVIDER_SITE_OTHER): Payer: Medicare Other | Admitting: Podiatry

## 2021-10-12 ENCOUNTER — Ambulatory Visit (INDEPENDENT_AMBULATORY_CARE_PROVIDER_SITE_OTHER): Payer: Medicare Other

## 2021-10-12 ENCOUNTER — Other Ambulatory Visit: Payer: Self-pay

## 2021-10-12 DIAGNOSIS — M7752 Other enthesopathy of left foot: Secondary | ICD-10-CM

## 2021-10-12 DIAGNOSIS — M10072 Idiopathic gout, left ankle and foot: Secondary | ICD-10-CM

## 2021-10-12 DIAGNOSIS — M79675 Pain in left toe(s): Secondary | ICD-10-CM

## 2021-10-12 MED ORDER — METHYLPREDNISOLONE 4 MG PO TBPK: ORAL_TABLET | ORAL | 0 refills | Status: DC

## 2021-10-12 NOTE — Patient Instructions (Signed)
Methylprednisolone Tablets What is this medication? METHYLPREDNISOLONE (meth ill pred NISS oh lone) treats many conditions such as asthma, allergic reactions, arthritis, inflammatory bowel diseases, adrenal, and blood or bone marrow disorders. It works by decreasing inflammation, slowing down an overactive immune system, or replacing cortisol normally made in the body. Cortisol is a hormone that plays an important role in how the body responds to stress, illness, and injury. It belongs to a group of medications called steroids. This medicine may be used for other purposes; ask your health care provider or pharmacist if you have questions. COMMON BRAND NAME(S): Medrol, Medrol Dosepak What should I tell my care team before I take this medication? They need to know if you have any of these conditions: Cushing's syndrome Eye disease, vision problems Diabetes Glaucoma Heart disease High blood pressure Infection (especially a virus infection such as chickenpox, cold sores, or herpes) Liver disease Mental illness Myasthenia gravis Osteoporosis Recently received or scheduled to receive a vaccine Seizures Stomach or intestine problems Thyroid disease An unusual or allergic reaction to lactose, methylprednisolone, other medications, foods, dyes, or preservatives Pregnant or trying to get pregnant Breast-feeding How should I use this medication? Take this medication by mouth with a glass of water. Follow the directions on the prescription label. Take this medication with food. If you are taking this medication once a [place], take it in the morning. Do not take it more often than directed. Do not suddenly stop taking your medication because you may develop a severe reaction. Your care team will tell you how much medication to take. If your care team wants you to stop the medication, the dose may be slowly lowered over time to avoid any side effects. Talk to your care team about the use of this medication  in children. Special care may be needed. Overdosage: If you think you have taken too much of this medicine contact a poison control center or emergency room at once. NOTE: This medicine is only for you. Do not share this medicine with others. What if I miss a dose? If you miss a dose, take it as soon as you can. If it is almost time for your next dose, talk to your care team. You may need to miss a dose or take an extra dose. Do not take double or extra doses without advice. What may interact with this medication? Do not take this medication with any of the following: Alefacept Echinacea Live virus vaccines Metyrapone Mifepristone This medication may also interact with the following: Amphotericin B Aspirin and aspirin-like medications Certain antibiotics like erythromycin, clarithromycin, troleandomycin Certain medications for diabetes Certain medications for fungal infections like ketoconazole Certain medications for seizures like carbamazepine, phenobarbital, phenytoin Certain medications that treat or prevent blood clots like warfarin Cholestyramine Cyclosporine Digoxin Diuretics Female hormones, like estrogens and birth control pills Isoniazid NSAIDs, medications for pain inflammation, like ibuprofen or naproxen Other medications for myasthenia gravis Rifampin Vaccines This list may not describe all possible interactions. Give your health care provider a list of all the medicines, herbs, non-prescription drugs, or dietary supplements you use. Also tell them if you smoke, drink alcohol, or use illegal drugs. Some items may interact with your medicine. What should I watch for while using this medication? Tell your care team if your symptoms do not start to get better or if they get worse. Do not stop taking except on your care team's advice. You may develop a severe reaction. Your care team will tell you how much medication  to take. This medication may increase your risk of getting  an infection. Tell your care team if you are around anyone with measles or chickenpox, or if you develop sores or blisters that do not heal properly. This medication may increase blood sugar levels. Ask your care team if changes in diet or medications are needed if you have diabetes. Tell your care team right away if you have any change in your eyesight. Using this medication for a long time may increase your risk of low bone mass. Talk to your care team about bone health. What side effects may I notice from receiving this medication? Side effects that you should report to your care team as soon as possible: Allergic reactions--skin rash, itching, hives, swelling of the face, lips, tongue, or throat Cushing syndrome--increased fat around the midsection, upper back, neck, or face, pink or purple stretch marks on the skin, thinning, fragile skin that easily bruises, unexpected hair growth High blood sugar (hyperglycemia)--increased thirst or amount of urine, unusual weakness or fatigue, blurry vision Increase in blood pressure Infection--fever, chills, cough, sore throat, wounds that don't heal, pain or trouble when passing urine, general feeling of discomfort or being unwell Low adrenal gland function--nausea, vomiting, loss of appetite, unusual weakness or fatigue, dizziness Mood and behavior changes--anxiety, nervousness, confusion, hallucinations, irritability, hostility, thoughts of suicide or self-harm, worsening mood, feelings of depression Stomach bleeding--bloody or black, tar-like stools, vomiting blood or brown material that looks like coffee grounds Swelling of the ankles, hands, or feet Side effects that usually do not require medical attention (report to your care team if they continue or are bothersome): Acne General discomfort and fatigue Headache Increase in appetite Nausea Trouble sleeping Weight gain This list may not describe all possible side effects. Call your doctor for  medical advice about side effects. You may report side effects to FDA at 1-800-FDA-1088. Where should I keep my medication? Keep out of the reach of children and pets. Store at room temperature between 20 and 25 degrees C (68 and 77 degrees F). Throw away any unused medication after the expiration date. NOTE: This sheet is a summary. It may not cover all possible information. If you have questions about this medicine, talk to your doctor, pharmacist, or health care provider.  2022 Elsevier/Gold Standard (2021-01-20 00:00:00)

## 2021-10-13 ENCOUNTER — Other Ambulatory Visit: Payer: Self-pay | Admitting: Podiatry

## 2021-10-13 DIAGNOSIS — M7752 Other enthesopathy of left foot: Secondary | ICD-10-CM

## 2021-10-13 LAB — CBC WITH DIFFERENTIAL/PLATELET
Basophils Absolute: 0 10*3/uL (ref 0.0–0.2)
Basos: 0 %
EOS (ABSOLUTE): 0.1 10*3/uL (ref 0.0–0.4)
Eos: 1 %
Hematocrit: 40.2 % (ref 34.0–46.6)
Hemoglobin: 12.8 g/dL (ref 11.1–15.9)
Immature Grans (Abs): 0 10*3/uL (ref 0.0–0.1)
Immature Granulocytes: 0 %
Lymphocytes Absolute: 2.6 10*3/uL (ref 0.7–3.1)
Lymphs: 26 %
MCH: 27.8 pg (ref 26.6–33.0)
MCHC: 31.8 g/dL (ref 31.5–35.7)
MCV: 87 fL (ref 79–97)
Monocytes Absolute: 1 10*3/uL — ABNORMAL HIGH (ref 0.1–0.9)
Monocytes: 10 %
Neutrophils Absolute: 6.3 10*3/uL (ref 1.4–7.0)
Neutrophils: 63 %
Platelets: 189 10*3/uL (ref 150–450)
RBC: 4.6 x10E6/uL (ref 3.77–5.28)
RDW: 14.3 % (ref 11.7–15.4)
WBC: 10 10*3/uL (ref 3.4–10.8)

## 2021-10-13 LAB — BASIC METABOLIC PANEL
BUN/Creatinine Ratio: 14 (ref 12–28)
BUN: 17 mg/dL (ref 8–27)
CO2: 24 mmol/L (ref 20–29)
Calcium: 9.6 mg/dL (ref 8.7–10.3)
Chloride: 106 mmol/L (ref 96–106)
Creatinine, Ser: 1.2 mg/dL — ABNORMAL HIGH (ref 0.57–1.00)
Glucose: 93 mg/dL (ref 70–99)
Potassium: 3.9 mmol/L (ref 3.5–5.2)
Sodium: 142 mmol/L (ref 134–144)
eGFR: 47 mL/min/{1.73_m2} — ABNORMAL LOW (ref >59)

## 2021-10-13 LAB — URIC ACID: Uric Acid: 5.7 mg/dL (ref 3.1–7.9)

## 2021-10-13 NOTE — Progress Notes (Signed)
Subjective:   Patient ID: Tami Shea, female   DOB: 75 y.o.   MRN: [id number]   HPI 75 year old female presents the office today with concerns of pain to her left big toe joint which is been on the past 2 to 3 weeks without any injury.  She said the pain is 5/10.  She gets some swelling and discomfort mostly in the big toe joint hurts with bending.  She tried soaking Epson salts 1 time which did help but she is tried over-the-counter pain reliever.  She does not have a history of gout.  She has no other concerns.   Review of Systems  All other systems reviewed and are negative.  No past medical history on file.  No past surgical history on file.   Current Outpatient Medications:    methylPREDNISolone (MEDROL DOSEPAK) 4 MG TBPK tablet, Take as directed, Disp: 21 tablet, Rfl: 0   amLODipine (NORVASC) 10 MG tablet, 1 tablet, Disp: , Rfl:    telmisartan-hydrochlorothiazide (MICARDIS HCT) 80-25 MG tablet, , Disp: , Rfl:   No Known Allergies        Objective:  Physical Exam  General: AAO x3, NAD  Dermatological: Mild edema noted on the first MPJ of the left foot there is no significant erythema or warmth there is no skin breakdown.  Vascular: Dorsalis Pedis artery and Posterior Tibial artery pedal pulses are 2/4 bilateral with immedate capillary fill time. There is no pain with calf compression, swelling, warmth, erythema.   Neruologic: Grossly intact via light touch bilateral.   Musculoskeletal: Tenderness on the first MPJ with localized edema present appears to be doing the left side.  No significant pain with range of motion but there is restriction of motion.  Muscular strength 5/5 in all groups tested bilateral.  Gait: Unassisted, Nonantalgic.       Assessment:   Capsulitis first MPJ left foot, possible gout     Plan:  -Treatment options discussed including all alternatives, risks, and complications -Etiology of symptoms were discussed -X-rays were obtained and  reviewed with the patient.  Edema noted along the first MPJ but there is no evidence of acute fracture. -Prescribed Medrol Dosepak -Ordered CBC, BMP, uric acid -Discussed wearing stiffer soled shoe  Return in about 3 weeks (around 11/02/2021).  Trula Slade DPM

## 2021-10-19 ENCOUNTER — Telehealth: Payer: Self-pay | Admitting: Podiatry

## 2021-10-19 NOTE — Telephone Encounter (Signed)
Per Dr. Jacqualyn Posey -   Patient has decreased kidney function level and no previous labs to compare it to, so this may not be anything new, but would like for her to contact her PCP Lucianne Lei) to see if she needs to follow up with her and we will send these results over to her PCP as well.   ~I attempted to call patient with this information, no answer and left message to call back~

## 2021-10-19 NOTE — Telephone Encounter (Signed)
Dr. Criss Rosales not within Epic, can you fax those labs over to her office? Thanks!!

## 2021-10-19 NOTE — Telephone Encounter (Signed)
Patient called and stated that we have received her blood work in our office and she would like someone to call and give her the results

## 2021-10-19 NOTE — Telephone Encounter (Signed)
Please advise 

## 2021-10-20 ENCOUNTER — Telehealth: Payer: Self-pay | Admitting: *Deleted

## 2021-10-20 NOTE — Telephone Encounter (Signed)
Faxed the labs 10/20/21, received confirmation

## 2021-10-20 NOTE — Telephone Encounter (Signed)
Patient called for lab results. Returned the call to patient giving results per Dr Jacqualyn Posey, verbalized understanding.  Faxed those results to Dr Lucianne Lei, 10/20/21,received confirmation.

## 2021-10-22 DIAGNOSIS — F4381 Prolonged grief disorder: Secondary | ICD-10-CM | POA: Diagnosis not present

## 2021-10-22 DIAGNOSIS — L22 Diaper dermatitis: Secondary | ICD-10-CM | POA: Diagnosis not present

## 2021-11-03 ENCOUNTER — Other Ambulatory Visit: Payer: Self-pay

## 2021-11-03 ENCOUNTER — Ambulatory Visit: Payer: Medicare Other | Admitting: Podiatry

## 2021-11-03 DIAGNOSIS — M10072 Idiopathic gout, left ankle and foot: Secondary | ICD-10-CM | POA: Diagnosis not present

## 2021-11-03 DIAGNOSIS — M7752 Other enthesopathy of left foot: Secondary | ICD-10-CM | POA: Diagnosis not present

## 2021-11-05 NOTE — Progress Notes (Signed)
Subjective: 75 year old female presents the office today for follow-up evaluation of pain to her left big toe joint.  She states that the symptoms have resolved.  Overall she has been doing much better.  Medications helped.  No new concerns.  No recent injury or changes otherwise.  Objective: AAO x3, NAD DP/PT pulses palpable bilaterally, CRT less than 3 seconds There is a bunion present but there is no erythema or signs of infection.  There is trace edema present.  There is no pain on exam there is no pain or crepitation with first MPJ range of motion.  No other discomfort identified today. No pain with calf compression, swelling, warmth, erythema  Assessment: Resolving capsulitis  Plan: -All treatment options discussed with the patient including all alternatives, risks, complications.  -Overall doing much better.  Monitor for any reoccurrence.  Discussed plan for help offloading with different pads.  Discussed about patient as well as her blood pressure. -She has follow-up with her primary care physician regards to her kidney function. -Patient encouraged to call the office with any questions, concerns, change in symptoms.   Trula Slade DPM

## 2021-11-10 DIAGNOSIS — R634 Abnormal weight loss: Secondary | ICD-10-CM | POA: Diagnosis not present

## 2021-11-10 DIAGNOSIS — E1169 Type 2 diabetes mellitus with other specified complication: Secondary | ICD-10-CM | POA: Diagnosis not present

## 2021-11-10 DIAGNOSIS — F4381 Prolonged grief disorder: Secondary | ICD-10-CM | POA: Diagnosis not present

## 2021-11-10 DIAGNOSIS — L22 Diaper dermatitis: Secondary | ICD-10-CM | POA: Diagnosis not present

## 2021-11-25 DIAGNOSIS — E785 Hyperlipidemia, unspecified: Secondary | ICD-10-CM | POA: Diagnosis not present

## 2021-11-25 DIAGNOSIS — E1169 Type 2 diabetes mellitus with other specified complication: Secondary | ICD-10-CM | POA: Diagnosis not present

## 2021-11-25 DIAGNOSIS — I10 Essential (primary) hypertension: Secondary | ICD-10-CM | POA: Diagnosis not present

## 2021-12-30 DIAGNOSIS — I10 Essential (primary) hypertension: Secondary | ICD-10-CM | POA: Diagnosis not present

## 2021-12-30 DIAGNOSIS — F4381 Prolonged grief disorder: Secondary | ICD-10-CM | POA: Diagnosis not present

## 2021-12-30 DIAGNOSIS — E7800 Pure hypercholesterolemia, unspecified: Secondary | ICD-10-CM | POA: Diagnosis not present

## 2021-12-30 DIAGNOSIS — L22 Diaper dermatitis: Secondary | ICD-10-CM | POA: Diagnosis not present

## 2021-12-30 DIAGNOSIS — E1169 Type 2 diabetes mellitus with other specified complication: Secondary | ICD-10-CM | POA: Diagnosis not present

## 2022-02-08 DIAGNOSIS — F4381 Prolonged grief disorder: Secondary | ICD-10-CM | POA: Diagnosis not present

## 2022-02-08 DIAGNOSIS — L22 Diaper dermatitis: Secondary | ICD-10-CM | POA: Diagnosis not present

## 2022-02-08 DIAGNOSIS — E785 Hyperlipidemia, unspecified: Secondary | ICD-10-CM | POA: Diagnosis not present

## 2022-02-08 DIAGNOSIS — I10 Essential (primary) hypertension: Secondary | ICD-10-CM | POA: Diagnosis not present

## 2022-02-18 DIAGNOSIS — L22 Diaper dermatitis: Secondary | ICD-10-CM | POA: Diagnosis not present

## 2022-02-18 DIAGNOSIS — I10 Essential (primary) hypertension: Secondary | ICD-10-CM | POA: Diagnosis not present

## 2022-03-22 DIAGNOSIS — Z0000 Encounter for general adult medical examination without abnormal findings: Secondary | ICD-10-CM | POA: Diagnosis not present

## 2022-03-22 DIAGNOSIS — F4381 Prolonged grief disorder: Secondary | ICD-10-CM | POA: Diagnosis not present

## 2022-03-22 DIAGNOSIS — I10 Essential (primary) hypertension: Secondary | ICD-10-CM | POA: Diagnosis not present

## 2022-03-22 DIAGNOSIS — M130 Polyarthritis, unspecified: Secondary | ICD-10-CM | POA: Diagnosis not present

## 2022-03-22 DIAGNOSIS — E782 Mixed hyperlipidemia: Secondary | ICD-10-CM | POA: Diagnosis not present

## 2022-03-22 DIAGNOSIS — E1169 Type 2 diabetes mellitus with other specified complication: Secondary | ICD-10-CM | POA: Diagnosis not present

## 2022-03-22 DIAGNOSIS — E559 Vitamin D deficiency, unspecified: Secondary | ICD-10-CM | POA: Diagnosis not present

## 2022-05-24 DIAGNOSIS — M545 Low back pain, unspecified: Secondary | ICD-10-CM | POA: Diagnosis not present

## 2022-05-24 DIAGNOSIS — W108XXA Fall (on) (from) other stairs and steps, initial encounter: Secondary | ICD-10-CM | POA: Diagnosis not present

## 2022-05-31 DIAGNOSIS — Z1231 Encounter for screening mammogram for malignant neoplasm of breast: Secondary | ICD-10-CM | POA: Diagnosis not present

## 2022-06-14 DIAGNOSIS — F4381 Prolonged grief disorder: Secondary | ICD-10-CM | POA: Diagnosis not present

## 2022-06-14 DIAGNOSIS — R413 Other amnesia: Secondary | ICD-10-CM | POA: Diagnosis not present

## 2022-06-14 DIAGNOSIS — I10 Essential (primary) hypertension: Secondary | ICD-10-CM | POA: Diagnosis not present

## 2022-07-09 DIAGNOSIS — Z20822 Contact with and (suspected) exposure to covid-19: Secondary | ICD-10-CM | POA: Diagnosis not present

## 2022-07-09 DIAGNOSIS — M940 Chondrocostal junction syndrome [Tietze]: Secondary | ICD-10-CM | POA: Diagnosis not present

## 2022-07-29 DIAGNOSIS — H2512 Age-related nuclear cataract, left eye: Secondary | ICD-10-CM | POA: Diagnosis not present

## 2022-07-29 DIAGNOSIS — H538 Other visual disturbances: Secondary | ICD-10-CM | POA: Diagnosis not present

## 2022-07-29 DIAGNOSIS — Z961 Presence of intraocular lens: Secondary | ICD-10-CM | POA: Diagnosis not present

## 2022-09-20 DIAGNOSIS — K611 Rectal abscess: Secondary | ICD-10-CM | POA: Diagnosis not present

## 2022-09-20 DIAGNOSIS — I10 Essential (primary) hypertension: Secondary | ICD-10-CM | POA: Diagnosis not present

## 2022-10-07 DIAGNOSIS — J399 Disease of upper respiratory tract, unspecified: Secondary | ICD-10-CM | POA: Diagnosis not present

## 2022-10-11 DIAGNOSIS — Z720 Tobacco use: Secondary | ICD-10-CM | POA: Diagnosis not present

## 2022-10-11 DIAGNOSIS — K6289 Other specified diseases of anus and rectum: Secondary | ICD-10-CM | POA: Diagnosis not present

## 2022-10-11 DIAGNOSIS — K5909 Other constipation: Secondary | ICD-10-CM | POA: Diagnosis not present

## 2022-12-23 DIAGNOSIS — I10 Essential (primary) hypertension: Secondary | ICD-10-CM | POA: Diagnosis not present

## 2022-12-23 DIAGNOSIS — R7303 Prediabetes: Secondary | ICD-10-CM | POA: Diagnosis not present

## 2022-12-23 DIAGNOSIS — E559 Vitamin D deficiency, unspecified: Secondary | ICD-10-CM | POA: Diagnosis not present

## 2023-03-11 DIAGNOSIS — H9201 Otalgia, right ear: Secondary | ICD-10-CM | POA: Diagnosis not present

## 2023-03-11 DIAGNOSIS — I10 Essential (primary) hypertension: Secondary | ICD-10-CM | POA: Diagnosis not present

## 2023-03-11 DIAGNOSIS — E785 Hyperlipidemia, unspecified: Secondary | ICD-10-CM | POA: Diagnosis not present

## 2023-03-11 DIAGNOSIS — M130 Polyarthritis, unspecified: Secondary | ICD-10-CM | POA: Diagnosis not present

## 2023-03-11 DIAGNOSIS — L210 Seborrhea capitis: Secondary | ICD-10-CM | POA: Diagnosis not present

## 2023-05-13 ENCOUNTER — Other Ambulatory Visit (HOSPITAL_COMMUNITY): Payer: Self-pay

## 2023-05-14 ENCOUNTER — Other Ambulatory Visit (HOSPITAL_BASED_OUTPATIENT_CLINIC_OR_DEPARTMENT_OTHER): Payer: Self-pay

## 2023-05-14 MED ORDER — CHLORPHEN-PE-ACETAMINOPHEN 4-10-325 MG PO TABS
1.0000 | ORAL_TABLET | Freq: Four times a day (QID) | ORAL | 1 refills | Status: DC
  Filled 2023-05-14: qty 20, 5d supply, fill #0

## 2023-05-17 ENCOUNTER — Other Ambulatory Visit (HOSPITAL_COMMUNITY): Payer: Self-pay

## 2023-05-18 DIAGNOSIS — H35363 Drusen (degenerative) of macula, bilateral: Secondary | ICD-10-CM | POA: Diagnosis not present

## 2023-05-18 DIAGNOSIS — H04123 Dry eye syndrome of bilateral lacrimal glands: Secondary | ICD-10-CM | POA: Diagnosis not present

## 2023-05-18 DIAGNOSIS — H25812 Combined forms of age-related cataract, left eye: Secondary | ICD-10-CM | POA: Diagnosis not present

## 2023-05-24 ENCOUNTER — Other Ambulatory Visit (HOSPITAL_BASED_OUTPATIENT_CLINIC_OR_DEPARTMENT_OTHER): Payer: Self-pay

## 2023-08-02 DIAGNOSIS — I10 Essential (primary) hypertension: Secondary | ICD-10-CM | POA: Diagnosis not present

## 2023-08-02 DIAGNOSIS — R7303 Prediabetes: Secondary | ICD-10-CM | POA: Diagnosis not present

## 2023-08-02 DIAGNOSIS — E559 Vitamin D deficiency, unspecified: Secondary | ICD-10-CM | POA: Diagnosis not present

## 2023-08-02 DIAGNOSIS — R634 Abnormal weight loss: Secondary | ICD-10-CM | POA: Diagnosis not present

## 2023-08-02 DIAGNOSIS — E785 Hyperlipidemia, unspecified: Secondary | ICD-10-CM | POA: Diagnosis not present

## 2023-08-02 DIAGNOSIS — R296 Repeated falls: Secondary | ICD-10-CM | POA: Diagnosis not present

## 2023-08-02 DIAGNOSIS — R062 Wheezing: Secondary | ICD-10-CM | POA: Diagnosis not present

## 2023-08-02 DIAGNOSIS — L210 Seborrhea capitis: Secondary | ICD-10-CM | POA: Diagnosis not present

## 2023-08-02 DIAGNOSIS — M130 Polyarthritis, unspecified: Secondary | ICD-10-CM | POA: Diagnosis not present

## 2023-08-02 DIAGNOSIS — R413 Other amnesia: Secondary | ICD-10-CM | POA: Diagnosis not present

## 2023-08-13 DIAGNOSIS — E7849 Other hyperlipidemia: Secondary | ICD-10-CM | POA: Diagnosis not present

## 2023-08-13 DIAGNOSIS — I10 Essential (primary) hypertension: Secondary | ICD-10-CM | POA: Diagnosis not present

## 2023-08-16 DIAGNOSIS — H25812 Combined forms of age-related cataract, left eye: Secondary | ICD-10-CM | POA: Diagnosis not present

## 2023-08-16 DIAGNOSIS — H04123 Dry eye syndrome of bilateral lacrimal glands: Secondary | ICD-10-CM | POA: Diagnosis not present

## 2023-08-16 DIAGNOSIS — H35371 Puckering of macula, right eye: Secondary | ICD-10-CM | POA: Diagnosis not present

## 2023-08-16 DIAGNOSIS — H35363 Drusen (degenerative) of macula, bilateral: Secondary | ICD-10-CM | POA: Diagnosis not present

## 2023-08-24 ENCOUNTER — Encounter: Payer: Self-pay | Admitting: Family Medicine

## 2023-09-12 ENCOUNTER — Ambulatory Visit: Payer: Medicare Other

## 2023-09-12 ENCOUNTER — Ambulatory Visit: Payer: Medicare Other | Admitting: Physical Therapy

## 2023-09-13 DIAGNOSIS — E782 Mixed hyperlipidemia: Secondary | ICD-10-CM | POA: Diagnosis not present

## 2023-09-13 DIAGNOSIS — R296 Repeated falls: Secondary | ICD-10-CM | POA: Diagnosis not present

## 2023-09-13 DIAGNOSIS — I10 Essential (primary) hypertension: Secondary | ICD-10-CM | POA: Diagnosis not present

## 2023-09-14 ENCOUNTER — Other Ambulatory Visit: Payer: Self-pay | Admitting: Family Medicine

## 2023-09-14 DIAGNOSIS — F17210 Nicotine dependence, cigarettes, uncomplicated: Secondary | ICD-10-CM

## 2023-09-26 ENCOUNTER — Ambulatory Visit
Admission: RE | Admit: 2023-09-26 | Discharge: 2023-09-26 | Disposition: A | Discharge status: Home / Self Care | Payer: Medicare Other | Source: Ambulatory Visit | Attending: Family Medicine | Admitting: Family Medicine

## 2023-09-26 DIAGNOSIS — F17210 Nicotine dependence, cigarettes, uncomplicated: Secondary | ICD-10-CM | POA: Diagnosis not present

## 2023-10-05 DIAGNOSIS — I10 Essential (primary) hypertension: Secondary | ICD-10-CM | POA: Diagnosis not present

## 2023-10-05 DIAGNOSIS — C3490 Malignant neoplasm of unspecified part of unspecified bronchus or lung: Secondary | ICD-10-CM | POA: Diagnosis not present

## 2023-10-05 DIAGNOSIS — M130 Polyarthritis, unspecified: Secondary | ICD-10-CM | POA: Diagnosis not present

## 2023-10-05 DIAGNOSIS — E1169 Type 2 diabetes mellitus with other specified complication: Secondary | ICD-10-CM | POA: Diagnosis not present

## 2023-10-11 DIAGNOSIS — H35363 Drusen (degenerative) of macula, bilateral: Secondary | ICD-10-CM | POA: Diagnosis not present

## 2023-10-11 DIAGNOSIS — H04123 Dry eye syndrome of bilateral lacrimal glands: Secondary | ICD-10-CM | POA: Diagnosis not present

## 2023-10-11 DIAGNOSIS — H25812 Combined forms of age-related cataract, left eye: Secondary | ICD-10-CM | POA: Diagnosis not present

## 2023-10-11 DIAGNOSIS — H35371 Puckering of macula, right eye: Secondary | ICD-10-CM | POA: Diagnosis not present

## 2023-10-14 DIAGNOSIS — C3490 Malignant neoplasm of unspecified part of unspecified bronchus or lung: Secondary | ICD-10-CM | POA: Diagnosis not present

## 2023-10-14 DIAGNOSIS — I129 Hypertensive chronic kidney disease with stage 1 through stage 4 chronic kidney disease, or unspecified chronic kidney disease: Secondary | ICD-10-CM | POA: Diagnosis not present

## 2023-10-14 DIAGNOSIS — E7849 Other hyperlipidemia: Secondary | ICD-10-CM | POA: Diagnosis not present

## 2023-10-14 DIAGNOSIS — N1832 Chronic kidney disease, stage 3b: Secondary | ICD-10-CM | POA: Diagnosis not present

## 2023-10-14 DIAGNOSIS — E559 Vitamin D deficiency, unspecified: Secondary | ICD-10-CM | POA: Diagnosis not present

## 2023-10-17 ENCOUNTER — Encounter: Payer: Self-pay | Admitting: Pulmonary Disease

## 2023-10-17 ENCOUNTER — Ambulatory Visit (INDEPENDENT_AMBULATORY_CARE_PROVIDER_SITE_OTHER): Payer: Medicare Other | Admitting: Pulmonary Disease

## 2023-10-17 ENCOUNTER — Telehealth: Payer: Self-pay | Admitting: Pulmonary Disease

## 2023-10-17 VITALS — BP 148/82 | HR 77 | Ht 64.0 in | Wt 154.8 lb

## 2023-10-17 DIAGNOSIS — Z720 Tobacco use: Secondary | ICD-10-CM | POA: Diagnosis not present

## 2023-10-17 DIAGNOSIS — R918 Other nonspecific abnormal finding of lung field: Secondary | ICD-10-CM | POA: Insufficient documentation

## 2023-10-17 NOTE — Telephone Encounter (Signed)
I also called patient to see why she called in. Left VM for pt.

## 2023-10-17 NOTE — Patient Instructions (Signed)
Thank you for visiting Dr. Tonia Brooms at [facility]. Today we recommend the following:  Orders Placed This Encounter  Procedures   Procedural/ Surgical Case Request: VIDEO BRONCHOSCOPY WITH ENDOBRONCHIAL ULTRASOUND   Bronchoscopy on 10/24/2023  Return in about 2 weeks (around 10/31/2023) for with Kandice Robinsons, NP, after Bronchoscopy.    Please do your part to reduce the spread of COVID-19.

## 2023-10-17 NOTE — Telephone Encounter (Signed)
Patient is returning phone call. Patient phone number is [phone] and [phone].

## 2023-10-17 NOTE — Progress Notes (Signed)
Synopsis: Referred in December 2024 for for pulmonary nodule by [name], MD  Subjective:   PATIENT ID: Tami Shea GENDER: female DOB: 1946-11-04, MRN: [id number]  Chief Complaint  Patient presents with   Consult    Ct 11/18     This is a 77 year old female.Referred for dilation of pulmonary nodule.  Patient had a lung cancer screening CT completed on 09/26/2023.  Was found to have multiple suspicious lesions bilaterally with associated right paratracheal adenopathy read as a lung RADS 4X concerning for malignancy.  Patient has been a long standing smoker.  No family history of lung cancer.  Mother had kidney disease father died from heart disease.     Past Medical History:  Diagnosis Date   HTN (hypertension)    Smoker      Family History  Problem Relation Age of Onset   Kidney disease Mother    Heart disease Father      Past Surgical History:  Procedure Laterality Date   PILONIDAL CYST EXCISION      Social History   Socioeconomic History   Marital status: Divorced    Spouse name: Not on file   Number of children: Not on file   Years of education: Not on file   Highest education level: Not on file  Occupational History   Not on file  Tobacco Use   Smoking status: Never   Smokeless tobacco: Never  Substance and Sexual Activity   Alcohol use: Not on file   Drug use: Not on file   Sexual activity: Not on file  Other Topics Concern   Not on file  Social History Narrative   Not on file   Social Determinants of Health   Financial Resource Strain: Not on file  Food Insecurity: Not on file  Transportation Needs: Not on file  Physical Activity: Not on file  Stress: Not on file  Social Connections: Not on file  Intimate Partner Violence: Not on file     No Known Allergies   Outpatient Medications Prior to Visit  Medication Sig Dispense Refill   amLODipine (NORVASC) 10 MG tablet 1 tablet     Chlorphen-PE-Acetaminophen 4-10-325 MG TABS Take 1  tablet by mouth 4 (four) times daily. 20 tablet 1   Cholecalciferol (VITAMIN D3) 1.25 MG (50000 UT) CAPS Take 1 capsule by mouth once a week.     clotrimazole-betamethasone (LOTRISONE) cream Apply topically 2 (two) times daily.     telmisartan-hydrochlorothiazide (MICARDIS HCT) 80-25 MG tablet      methylPREDNISolone (MEDROL DOSEPAK) 4 MG TBPK tablet Take as directed (Patient not taking: Reported on 10/17/2023) 21 tablet 0   No facility-administered medications prior to visit.    Review of Systems  Constitutional:  Negative for chills, fever, malaise/fatigue and weight loss.  HENT:  Negative for hearing loss, sore throat and tinnitus.   Eyes:  Negative for blurred vision and double vision.  Respiratory:  Positive for cough and shortness of breath. Negative for hemoptysis, sputum production, wheezing and stridor.   Cardiovascular:  Negative for chest pain, palpitations, orthopnea, leg swelling and PND.  Gastrointestinal:  Negative for abdominal pain, constipation, diarrhea, heartburn, nausea and vomiting.  Genitourinary:  Negative for dysuria, hematuria and urgency.  Musculoskeletal:  Negative for joint pain and myalgias.  Skin:  Negative for itching and rash.  Neurological:  Negative for dizziness, tingling, weakness and headaches.  Endo/Heme/Allergies:  Negative for environmental allergies. Does not bruise/bleed easily.  Psychiatric/Behavioral:  Negative for depression. The patient  is not nervous/anxious and does not have insomnia.   All other systems reviewed and are negative.    Objective:  Physical Exam Vitals reviewed.  Constitutional:      General: She is not in acute distress.    Appearance: She is well-developed.  HENT:     Head: Normocephalic and atraumatic.  Eyes:     General: No scleral icterus.    Conjunctiva/sclera: Conjunctivae normal.     Pupils: Pupils are equal, round, and reactive to light.  Neck:     Vascular: No JVD.     Trachea: No tracheal deviation.   Cardiovascular:     Rate and Rhythm: Normal rate and regular rhythm.     Heart sounds: No murmur heard. Pulmonary:     Effort: Pulmonary effort is normal. No tachypnea, accessory muscle usage or respiratory distress.     Breath sounds: No stridor. No wheezing, rhonchi or rales.     Comments: Diminished breath sounds bilaterally no wheeze Abdominal:     General: Bowel sounds are normal. There is no distension.     Palpations: Abdomen is soft.     Tenderness: There is no abdominal tenderness.  Musculoskeletal:        General: No tenderness.     Cervical back: Neck supple.  Lymphadenopathy:     Cervical: No cervical adenopathy.  Skin:    General: Skin is warm and dry.     Capillary Refill: Capillary refill takes less than 2 seconds.     Findings: No rash.  Neurological:     Mental Status: She is alert and oriented to person, place, and time.  Psychiatric:        Behavior: Behavior normal.      Vitals:   10/17/23 0949  BP: (!) 148/82  Pulse: 77  SpO2: 93%  Weight: 154 lb 12.8 oz (70.2 kg)  Height: 5\' 4"  (1.626 m)   93% on RA BMI Readings from Last 3 Encounters:  10/17/23 26.57 kg/m   Wt Readings from Last 3 Encounters:  10/17/23 154 lb 12.8 oz (70.2 kg)     CBC    Component Value Date/Time   WBC 10.0 10/12/2021 1043   RBC 4.60 10/12/2021 1043   HGB 12.8 10/12/2021 1043   HCT 40.2 10/12/2021 1043   PLT 189 10/12/2021 1043   MCV 87 10/12/2021 1043   MCH 27.8 10/12/2021 1043   MCHC 31.8 10/12/2021 1043   RDW 14.3 10/12/2021 1043   LYMPHSABS 2.6 10/12/2021 1043   EOSABS 0.1 10/12/2021 1043   BASOSABS 0.0 10/12/2021 1043    Chest Imaging:  Lung cancer screening CT November 2024: Lung RADS 4X with bilateral pulmonary lesions and adenopathy concerning for malignancy. The patient's images have been independently reviewed by me.    Pulmonary Functions Testing Results:     No data to display          FeNO:   Pathology:   Echocardiogram:   Heart  Catheterization:     Assessment & Plan:     ICD-10-CM   1. Lung mass  R91.8 Procedural/ Surgical Case Request: VIDEO BRONCHOSCOPY WITH ENDOBRONCHIAL ULTRASOUND    2. Abnormal CT lung screening  R91.8     3. Tobacco use  Z72.0       Discussion:  This is a 77 year old female, longstanding history of tobacco use abnormal lung cancer screening CC with a right lung mass and hilar adenopathy and paratracheal adenopathy concerning for an advanced stage malignancy.  Plan: We talked  about the risk benefits alternatives proceed with bronchoscopy. Patient is agreeable to proceed with video bronchoscopy endobronchial ultrasound. Will plan to biopsy the lesion in the right hilum as well as paratracheal adenopathy. She will need her next generation sequencing molecular diagnostics. Plan to send for Guardant[employer] CDX.  Return to clinic 1 week post bronchoscopy with SG, NP to review path results.  Pending path results we will get her set up with medical oncology. We will also order pet imaging to be complete.     Current Outpatient Medications:    amLODipine (NORVASC) 10 MG tablet, 1 tablet, Disp: , Rfl:    Chlorphen-PE-Acetaminophen 4-10-325 MG TABS, Take 1 tablet by mouth 4 (four) times daily., Disp: 20 tablet, Rfl: 1   Cholecalciferol (VITAMIN D3) 1.25 MG (50000 UT) CAPS, Take 1 capsule by mouth once a week., Disp: , Rfl:    clotrimazole-betamethasone (LOTRISONE) cream, Apply topically 2 (two) times daily., Disp: , Rfl:    telmisartan-hydrochlorothiazide (MICARDIS HCT) 80-25 MG tablet, , Disp: , Rfl:    methylPREDNISolone (MEDROL DOSEPAK) 4 MG TBPK tablet, Take as directed (Patient not taking: Reported on 10/17/2023), Disp: 21 tablet, Rfl: 0    Josephine Igo, DO  Pulmonary Critical Care 10/17/2023 10:15 AM

## 2023-10-17 NOTE — H&P (View-Only) (Signed)
 Synopsis: Referred in December 2024 for for pulmonary nodule by [name], MD  Subjective:   PATIENT ID: Tami Shea GENDER: female DOB: 1946-11-04, MRN: [id number]  Chief Complaint  Patient presents with   Consult    Ct 11/18     This is a 77 year old female.Referred for dilation of pulmonary nodule.  Patient had a lung cancer screening CT completed on 09/26/2023.  Was found to have multiple suspicious lesions bilaterally with associated right paratracheal adenopathy read as a lung RADS 4X concerning for malignancy.  Patient has been a long standing smoker.  No family history of lung cancer.  Mother had kidney disease father died from heart disease.     Past Medical History:  Diagnosis Date   HTN (hypertension)    Smoker      Family History  Problem Relation Age of Onset   Kidney disease Mother    Heart disease Father      Past Surgical History:  Procedure Laterality Date   PILONIDAL CYST EXCISION      Social History   Socioeconomic History   Marital status: Divorced    Spouse name: Not on file   Number of children: Not on file   Years of education: Not on file   Highest education level: Not on file  Occupational History   Not on file  Tobacco Use   Smoking status: Never   Smokeless tobacco: Never  Substance and Sexual Activity   Alcohol use: Not on file   Drug use: Not on file   Sexual activity: Not on file  Other Topics Concern   Not on file  Social History Narrative   Not on file   Social Determinants of Health   Financial Resource Strain: Not on file  Food Insecurity: Not on file  Transportation Needs: Not on file  Physical Activity: Not on file  Stress: Not on file  Social Connections: Not on file  Intimate Partner Violence: Not on file     No Known Allergies   Outpatient Medications Prior to Visit  Medication Sig Dispense Refill   amLODipine (NORVASC) 10 MG tablet 1 tablet     Chlorphen-PE-Acetaminophen 4-10-325 MG TABS Take 1  tablet by mouth 4 (four) times daily. 20 tablet 1   Cholecalciferol (VITAMIN D3) 1.25 MG (50000 UT) CAPS Take 1 capsule by mouth once a week.     clotrimazole-betamethasone (LOTRISONE) cream Apply topically 2 (two) times daily.     telmisartan-hydrochlorothiazide (MICARDIS HCT) 80-25 MG tablet      methylPREDNISolone (MEDROL DOSEPAK) 4 MG TBPK tablet Take as directed (Patient not taking: Reported on 10/17/2023) 21 tablet 0   No facility-administered medications prior to visit.    Review of Systems  Constitutional:  Negative for chills, fever, malaise/fatigue and weight loss.  HENT:  Negative for hearing loss, sore throat and tinnitus.   Eyes:  Negative for blurred vision and double vision.  Respiratory:  Positive for cough and shortness of breath. Negative for hemoptysis, sputum production, wheezing and stridor.   Cardiovascular:  Negative for chest pain, palpitations, orthopnea, leg swelling and PND.  Gastrointestinal:  Negative for abdominal pain, constipation, diarrhea, heartburn, nausea and vomiting.  Genitourinary:  Negative for dysuria, hematuria and urgency.  Musculoskeletal:  Negative for joint pain and myalgias.  Skin:  Negative for itching and rash.  Neurological:  Negative for dizziness, tingling, weakness and headaches.  Endo/Heme/Allergies:  Negative for environmental allergies. Does not bruise/bleed easily.  Psychiatric/Behavioral:  Negative for depression. The patient  is not nervous/anxious and does not have insomnia.   All other systems reviewed and are negative.    Objective:  Physical Exam Vitals reviewed.  Constitutional:      General: She is not in acute distress.    Appearance: She is well-developed.  HENT:     Head: Normocephalic and atraumatic.  Eyes:     General: No scleral icterus.    Conjunctiva/sclera: Conjunctivae normal.     Pupils: Pupils are equal, round, and reactive to light.  Neck:     Vascular: No JVD.     Trachea: No tracheal deviation.   Cardiovascular:     Rate and Rhythm: Normal rate and regular rhythm.     Heart sounds: No murmur heard. Pulmonary:     Effort: Pulmonary effort is normal. No tachypnea, accessory muscle usage or respiratory distress.     Breath sounds: No stridor. No wheezing, rhonchi or rales.     Comments: Diminished breath sounds bilaterally no wheeze Abdominal:     General: Bowel sounds are normal. There is no distension.     Palpations: Abdomen is soft.     Tenderness: There is no abdominal tenderness.  Musculoskeletal:        General: No tenderness.     Cervical back: Neck supple.  Lymphadenopathy:     Cervical: No cervical adenopathy.  Skin:    General: Skin is warm and dry.     Capillary Refill: Capillary refill takes less than 2 seconds.     Findings: No rash.  Neurological:     Mental Status: She is alert and oriented to person, place, and time.  Psychiatric:        Behavior: Behavior normal.      Vitals:   10/17/23 0949  BP: (!) 148/82  Pulse: 77  SpO2: 93%  Weight: 154 lb 12.8 oz (70.2 kg)  Height: 5\' 4"  (1.626 m)   93% on RA BMI Readings from Last 3 Encounters:  10/17/23 26.57 kg/m   Wt Readings from Last 3 Encounters:  10/17/23 154 lb 12.8 oz (70.2 kg)     CBC    Component Value Date/Time   WBC 10.0 10/12/2021 1043   RBC 4.60 10/12/2021 1043   HGB 12.8 10/12/2021 1043   HCT 40.2 10/12/2021 1043   PLT 189 10/12/2021 1043   MCV 87 10/12/2021 1043   MCH 27.8 10/12/2021 1043   MCHC 31.8 10/12/2021 1043   RDW 14.3 10/12/2021 1043   LYMPHSABS 2.6 10/12/2021 1043   EOSABS 0.1 10/12/2021 1043   BASOSABS 0.0 10/12/2021 1043    Chest Imaging:  Lung cancer screening CT November 2024: Lung RADS 4X with bilateral pulmonary lesions and adenopathy concerning for malignancy. The patient's images have been independently reviewed by me.    Pulmonary Functions Testing Results:     No data to display          FeNO:   Pathology:   Echocardiogram:   Heart  Catheterization:     Assessment & Plan:     ICD-10-CM   1. Lung mass  R91.8 Procedural/ Surgical Case Request: VIDEO BRONCHOSCOPY WITH ENDOBRONCHIAL ULTRASOUND    2. Abnormal CT lung screening  R91.8     3. Tobacco use  Z72.0       Discussion:  This is a 77 year old female, longstanding history of tobacco use abnormal lung cancer screening CC with a right lung mass and hilar adenopathy and paratracheal adenopathy concerning for an advanced stage malignancy.  Plan: We talked  about the risk benefits alternatives proceed with bronchoscopy. Patient is agreeable to proceed with video bronchoscopy endobronchial ultrasound. Will plan to biopsy the lesion in the right hilum as well as paratracheal adenopathy. She will need her next generation sequencing molecular diagnostics. Plan to send for [employer] CDX.  Return to clinic 1 week post bronchoscopy with SG, NP to review path results.  Pending path results we will get her set up with medical oncology. We will also order pet imaging to be complete.     Current Outpatient Medications:    amLODipine (NORVASC) 10 MG tablet, 1 tablet, Disp: , Rfl:    Chlorphen-PE-Acetaminophen 4-10-325 MG TABS, Take 1 tablet by mouth 4 (four) times daily., Disp: 20 tablet, Rfl: 1   Cholecalciferol (VITAMIN D3) 1.25 MG (50000 UT) CAPS, Take 1 capsule by mouth once a week., Disp: , Rfl:    clotrimazole-betamethasone (LOTRISONE) cream, Apply topically 2 (two) times daily., Disp: , Rfl:    telmisartan-hydrochlorothiazide (MICARDIS HCT) 80-25 MG tablet, , Disp: , Rfl:    methylPREDNISolone (MEDROL DOSEPAK) 4 MG TBPK tablet, Take as directed (Patient not taking: Reported on 10/17/2023), Disp: 21 tablet, Rfl: 0    Josephine Igo, DO  Pulmonary Critical Care 10/17/2023 10:15 AM

## 2023-10-17 NOTE — Telephone Encounter (Signed)
Lmam for patient to call me back I had not called her but need to go over the letter

## 2023-10-19 ENCOUNTER — Telehealth: Payer: Self-pay | Admitting: Pulmonary Disease

## 2023-10-19 NOTE — Telephone Encounter (Signed)
PT has a Bronc on 12/16 and a PET on 12/18. She wonders why the PET is not done before the Bronch. Older lady, just needs clarification please. Call @ [phone]

## 2023-10-20 ENCOUNTER — Other Ambulatory Visit: Payer: Self-pay

## 2023-10-20 ENCOUNTER — Encounter (HOSPITAL_COMMUNITY): Payer: Self-pay | Admitting: Pulmonary Disease

## 2023-10-20 NOTE — Progress Notes (Signed)
PCP - [name], MD  Cardiologist - denies  PPM/ICD - denies Device Orders - n/a Rep Notified - n/a  Chest x-ray -  CHEST CT 10-04-23 EKG -  Stress Test -  ECHO -  Cardiac Cath -   CPAP - denies  DM denies  Blood Thinner Instructions: denies Aspirin Instructions: na  ERAS Protcol - NPO   COVID TEST- n/a  Anesthesia review: no  Patient verbally denies any shortness of breath, fever, cough and chest pain during phone call   -------------  SDW INSTRUCTIONS given:  Your procedure is scheduled on DECEMBER 16,2024.  Report to [facility] Main Entrance "A" at 6:30 A.M., and check in at the Admitting office.  Call this number if you have problems the morning of surgery:  [phone]   Remember:  Do not eat or drink after midnight the night before your surgery     Take these medicines the morning of surgery with A SIP OF WATER  amLODipine (NORVASC)    As of today, STOP taking any Aspirin (unless otherwise instructed by your surgeon) Aleve, Naproxen, Ibuprofen, Motrin, Advil, Goody's, BC's, all herbal medications, fish oil, and all vitamins.                      Do not wear jewelry, make up, or nail polish            Do not wear lotions, powders, perfumes/colognes, or deodorant.            Do not shave 48 hours prior to surgery.  Men may shave face and neck.            Do not bring valuables to the hospital.            [facility] is not responsible for any belongings or valuables.  Do NOT Smoke (Tobacco/Vaping) 24 hours prior to your procedure If you use a CPAP at night, you may bring all equipment for your overnight stay.   Contacts, glasses, dentures or bridgework may not be worn into surgery.      For patients admitted to the hospital, discharge time will be determined by your treatment team.   Patients discharged the [place] of surgery will not be allowed to drive home, and someone needs to stay with them for 24 hours.    Special instructions:   [facility]- Preparing For Surgery  Before surgery, you can play an important role. Because skin is not sterile, your skin needs to be as free of germs as possible. You can reduce the number of germs on your skin by washing with CHG (chlorahexidine gluconate) Soap before surgery.  CHG is an antiseptic cleaner which kills germs and bonds with the skin to continue killing germs even after washing.    Oral Hygiene is also important to reduce your risk of infection.  Remember - BRUSH YOUR TEETH THE MORNING OF SURGERY WITH YOUR REGULAR TOOTHPASTE  Please do not use if you have an allergy to CHG or antibacterial soaps. If your skin becomes reddened/irritated stop using the CHG.  Do not shave (including legs and underarms) for at least 48 hours prior to first CHG shower. It is OK to shave your face.  Please follow these instructions carefully.   Shower the NIGHT BEFORE SURGERY and the MORNING OF SURGERY with DIAL Soap.   Pat yourself dry with a CLEAN TOWEL.  Wear CLEAN PAJAMAS to bed the night before surgery  Place CLEAN SHEETS on  your bed the night of your first shower and DO NOT SLEEP WITH PETS.   [place] of Surgery: Please shower morning of surgery  Wear Clean/Comfortable clothing the morning of surgery Do not apply any deodorants/lotions.   Remember to brush your teeth WITH YOUR REGULAR TOOTHPASTE.   Questions were answered. Patient verbalized understanding of instructions.

## 2023-10-22 NOTE — Telephone Encounter (Signed)
Returned call. Patient reports she has discussed this with her daughter who is a Garment/textile technologist here at [employer]. We confirmed the date and time of her procedure. She has no further questions and appreciates the follow up call.

## 2023-10-24 ENCOUNTER — Encounter (HOSPITAL_COMMUNITY): Payer: Self-pay | Admitting: Pulmonary Disease

## 2023-10-24 ENCOUNTER — Other Ambulatory Visit: Payer: Self-pay

## 2023-10-24 ENCOUNTER — Ambulatory Visit (HOSPITAL_COMMUNITY): Payer: Medicare Other | Admitting: Anesthesiology

## 2023-10-24 ENCOUNTER — Encounter (HOSPITAL_COMMUNITY)
Admission: RE | Disposition: A | Discharge status: Home / Self Care | Payer: Self-pay | Source: Home / Self Care | Attending: Pulmonary Disease

## 2023-10-24 ENCOUNTER — Ambulatory Visit (HOSPITAL_COMMUNITY)
Admission: RE | Admit: 2023-10-24 | Discharge: 2023-10-24 | Disposition: A | Discharge status: Home / Self Care | Payer: Medicare Other | Attending: Pulmonary Disease | Admitting: Pulmonary Disease

## 2023-10-24 DIAGNOSIS — R918 Other nonspecific abnormal finding of lung field: Secondary | ICD-10-CM | POA: Diagnosis present

## 2023-10-24 DIAGNOSIS — I10 Essential (primary) hypertension: Secondary | ICD-10-CM | POA: Insufficient documentation

## 2023-10-24 DIAGNOSIS — C3401 Malignant neoplasm of right main bronchus: Secondary | ICD-10-CM

## 2023-10-24 DIAGNOSIS — Z87891 Personal history of nicotine dependence: Secondary | ICD-10-CM | POA: Diagnosis not present

## 2023-10-24 DIAGNOSIS — C771 Secondary and unspecified malignant neoplasm of intrathoracic lymph nodes: Secondary | ICD-10-CM | POA: Diagnosis not present

## 2023-10-24 DIAGNOSIS — C3492 Malignant neoplasm of unspecified part of left bronchus or lung: Secondary | ICD-10-CM | POA: Diagnosis not present

## 2023-10-24 HISTORY — DX: Dyspnea, unspecified: R06.00

## 2023-10-24 HISTORY — PX: VIDEO BRONCHOSCOPY WITH ENDOBRONCHIAL ULTRASOUND: SHX6177

## 2023-10-24 HISTORY — PX: BRONCHIAL NEEDLE ASPIRATION BIOPSY: SHX5106

## 2023-10-24 HISTORY — DX: Personal history of urinary calculi: Z87.442

## 2023-10-24 LAB — CBC
HCT: 37.4 % (ref 36.0–46.0)
Hemoglobin: 11.5 g/dL — ABNORMAL LOW (ref 12.0–15.0)
MCH: 24.9 pg — ABNORMAL LOW (ref 26.0–34.0)
MCHC: 30.7 g/dL (ref 30.0–36.0)
MCV: 81.1 fL (ref 80.0–100.0)
Platelets: 229 10*3/uL (ref 150–400)
RBC: 4.61 MIL/uL (ref 3.87–5.11)
RDW: 17.8 % — ABNORMAL HIGH (ref 11.5–15.5)
WBC: 9 10*3/uL (ref 4.0–10.5)
nRBC: 0 % (ref 0.0–0.2)

## 2023-10-24 LAB — BASIC METABOLIC PANEL
Anion gap: 10 (ref 5–15)
BUN: 20 mg/dL (ref 8–23)
CO2: 24 mmol/L (ref 22–32)
Calcium: 9.3 mg/dL (ref 8.9–10.3)
Chloride: 105 mmol/L (ref 98–111)
Creatinine, Ser: 1.37 mg/dL — ABNORMAL HIGH (ref 0.44–1.00)
GFR, Estimated: 40 mL/min — ABNORMAL LOW (ref >60)
Glucose, Bld: 96 mg/dL (ref 70–99)
Potassium: 4 mmol/L (ref 3.5–5.1)
Sodium: 139 mmol/L (ref 135–145)

## 2023-10-24 SURGERY — BRONCHOSCOPY, WITH EBUS
Anesthesia: General | Laterality: Right

## 2023-10-24 MED ORDER — PHENYLEPHRINE 80 MCG/ML (10ML) SYRINGE FOR IV PUSH (FOR BLOOD PRESSURE SUPPORT)
PREFILLED_SYRINGE | INTRAVENOUS | Status: DC | PRN
  Administered 2023-10-24: 160 ug via INTRAVENOUS
  Administered 2023-10-24 (×2): 80 ug via INTRAVENOUS

## 2023-10-24 MED ORDER — SUGAMMADEX SODIUM 200 MG/2ML IV SOLN
INTRAVENOUS | Status: DC | PRN
  Administered 2023-10-24: 200 mg via INTRAVENOUS

## 2023-10-24 MED ORDER — PROPOFOL 10 MG/ML IV BOLUS
INTRAVENOUS | Status: DC | PRN
  Administered 2023-10-24: 200 mg via INTRAVENOUS

## 2023-10-24 MED ORDER — FENTANYL CITRATE (PF) 100 MCG/2ML IJ SOLN: 25.0000 ug | INTRAMUSCULAR | Status: DC | PRN

## 2023-10-24 MED ORDER — LIDOCAINE 2% (20 MG/ML) 5 ML SYRINGE
INTRAMUSCULAR | Status: DC | PRN
  Administered 2023-10-24: 60 mg via INTRAVENOUS

## 2023-10-24 MED ORDER — AMISULPRIDE (ANTIEMETIC) 5 MG/2ML IV SOLN: 10.0000 mg | Freq: Once | INTRAVENOUS | Status: DC | PRN

## 2023-10-24 MED ORDER — ROCURONIUM BROMIDE 10 MG/ML (PF) SYRINGE
PREFILLED_SYRINGE | INTRAVENOUS | Status: DC | PRN
  Administered 2023-10-24: 40 mg via INTRAVENOUS

## 2023-10-24 MED ORDER — CHLORHEXIDINE GLUCONATE 0.12 % MT SOLN
15.0000 mL | Freq: Once | OROMUCOSAL | Status: AC
  Administered 2023-10-24: 15 mL via OROMUCOSAL
  Filled 2023-10-24: qty 15

## 2023-10-24 MED ORDER — DEXAMETHASONE SODIUM PHOSPHATE 10 MG/ML IJ SOLN
INTRAMUSCULAR | Status: DC | PRN
  Administered 2023-10-24: 10 mg via INTRAVENOUS

## 2023-10-24 MED ORDER — ACETAMINOPHEN 10 MG/ML IV SOLN
1000.0000 mg | Freq: Once | INTRAVENOUS | Status: DC | PRN
Start: 2023-10-24 — End: 2023-10-24

## 2023-10-24 MED ORDER — IPRATROPIUM-ALBUTEROL 0.5-2.5 (3) MG/3ML IN SOLN
3.0000 mL | Freq: Once | RESPIRATORY_TRACT | Status: AC
  Administered 2023-10-24: 3 mL via RESPIRATORY_TRACT

## 2023-10-24 MED ORDER — SODIUM CHLORIDE 0.9 % IV SOLN: INTRAVENOUS | Status: DC

## 2023-10-24 MED ORDER — ONDANSETRON HCL 4 MG/2ML IJ SOLN: 4.0000 mg | Freq: Once | INTRAMUSCULAR | Status: DC | PRN

## 2023-10-24 SURGICAL SUPPLY — 28 items
BRUSH CYTOL CELLEBRITY 1.5X140 (MISCELLANEOUS) IMPLANT
CANISTER SUCT 3000ML PPV (MISCELLANEOUS) ×2 IMPLANT
CONT SPEC 4OZ CLIKSEAL STRL BL (MISCELLANEOUS) ×2 IMPLANT
COVER BACK TABLE 60X90IN (DRAPES) ×2 IMPLANT
COVER DOME SNAP 22 D (MISCELLANEOUS) ×2 IMPLANT
FORCEPS BIOP RJ4 1.8 (CUTTING FORCEPS) IMPLANT
GAUZE SPONGE 4X4 12PLY STRL (GAUZE/BANDAGES/DRESSINGS) ×2 IMPLANT
GLOVE BIO SURGEON STRL SZ7.5 (GLOVE) ×2 IMPLANT
GOWN STRL REUS W/ TWL LRG LVL3 (GOWN DISPOSABLE) ×2 IMPLANT
KIT CLEAN ENDO COMPLIANCE (KITS) ×4 IMPLANT
KIT TURNOVER KIT B (KITS) ×2 IMPLANT
MARKER SKIN DUAL TIP RULER LAB (MISCELLANEOUS) ×2 IMPLANT
NDL EBUS SONO TIP PENTAX (NEEDLE) ×2 IMPLANT
NEEDLE EBUS SONO TIP PENTAX (NEEDLE) ×2 IMPLANT
NS IRRIG 1000ML POUR BTL (IV SOLUTION) ×2 IMPLANT
OIL SILICONE PENTAX (PARTS (SERVICE/REPAIRS)) ×2 IMPLANT
PAD ARMBOARD 7.5X6 YLW CONV (MISCELLANEOUS) ×4 IMPLANT
SOL ANTI FOG 6CC (MISCELLANEOUS) ×2 IMPLANT
SYR 20CC LL (SYRINGE) ×4 IMPLANT
SYR 20ML ECCENTRIC (SYRINGE) ×4 IMPLANT
SYR 50ML SLIP (SYRINGE) IMPLANT
SYR 5ML LUER SLIP (SYRINGE) ×2 IMPLANT
TOWEL OR 17X24 6PK STRL BLUE (TOWEL DISPOSABLE) ×2 IMPLANT
TRAP SPECIMEN MUCOUS 40CC (MISCELLANEOUS) IMPLANT
TUBE CONNECTING 20X1/4 (TUBING) ×4 IMPLANT
UNDERPAD 30X30 (UNDERPADS AND DIAPERS) ×2 IMPLANT
VALVE DISPOSABLE (MISCELLANEOUS) ×2 IMPLANT
WATER STERILE IRR 1000ML POUR (IV SOLUTION) ×2 IMPLANT

## 2023-10-24 NOTE — Interval H&P Note (Signed)
History and Physical Interval Note:  10/24/2023 8:30 AM  Tami Shea  has presented today for surgery, with the diagnosis of lRIGHT Lung mass.  The various methods of treatment have been discussed with the patient and family. After consideration of risks, benefits and other options for treatment, the patient has consented to  Procedure(s): VIDEO BRONCHOSCOPY WITH ENDOBRONCHIAL ULTRASOUND (Right) as a surgical intervention.  The patient's history has been reviewed, patient examined, no change in status, stable for surgery.  I have reviewed the patient's chart and labs.  Questions were answered to the patient's satisfaction.     Rachel Bo Andrez [place]

## 2023-10-24 NOTE — Transfer of Care (Signed)
Immediate Anesthesia Transfer of Care Note  Patient: Tami Shea  Procedure(s) Performed: VIDEO BRONCHOSCOPY WITH ENDOBRONCHIAL ULTRASOUND (Right) BRONCHIAL NEEDLE ASPIRATION BIOPSIES  Patient Location: PACU  Anesthesia Type:General  Level of Consciousness: awake, alert , and oriented  Airway & Oxygen Therapy: Patient connected to nasal cannula oxygen  Post-op Assessment: Report given to RN and Post -op Vital signs reviewed and stable  Post vital signs: Reviewed and stable  Last Vitals:  Vitals Value Taken Time  BP 129/70 10/24/23 1000  Temp    Pulse 69 10/24/23 1003  Resp 27 10/24/23 1003  SpO2 90 % 10/24/23 1003  Vitals shown include unfiled device data.  Last Pain:  Vitals:   10/24/23 0718  TempSrc:   PainSc: 0-No pain      Patients Stated Pain Goal: 0 (10/24/23 0718)  Complications: No notable events documented.

## 2023-10-24 NOTE — Anesthesia Postprocedure Evaluation (Signed)
Anesthesia Post Note  Patient: Tami Shea  Procedure(s) Performed: VIDEO BRONCHOSCOPY WITH ENDOBRONCHIAL ULTRASOUND (Right) BRONCHIAL NEEDLE ASPIRATION BIOPSIES     Patient location during evaluation: PACU Anesthesia Type: General Level of consciousness: awake Pain management: pain level controlled Vital Signs Assessment: post-procedure vital signs reviewed and stable Respiratory status: spontaneous breathing, nonlabored ventilation and respiratory function stable Cardiovascular status: blood pressure returned to baseline and stable Postop Assessment: no apparent nausea or vomiting Anesthetic complications: no   No notable events documented.  Last Vitals:  Vitals:   10/24/23 1030 10/24/23 1100  BP: 116/78 115/84  Pulse: 76 73  Resp: (!) 24 (!) 22  Temp: 36.6 C   SpO2: 90% 93%    Last Pain:  Vitals:   10/24/23 1030  TempSrc:   PainSc: 0-No pain                 Nieves Chapa P Anjelina Dung

## 2023-10-24 NOTE — Anesthesia Preprocedure Evaluation (Addendum)
Anesthesia Evaluation  Patient identified by MRN, date of birth, ID band Patient awake    Reviewed: Allergy & Precautions, NPO status , Patient's Chart, lab work & pertinent test results  Airway Mallampati: II  TM Distance: >3 FB Neck ROM: Full    Dental  (+) Partial Upper   Pulmonary former smoker   Pulmonary exam normal        Cardiovascular hypertension, Pt. on medications Normal cardiovascular exam     Neuro/Psych negative neurological ROS  negative psych ROS   GI/Hepatic negative GI ROS, Neg liver ROS,,,  Endo/Other  negative endocrine ROS    Renal/GU negative Renal ROS     Musculoskeletal negative musculoskeletal ROS (+)    Abdominal   Peds  Hematology negative hematology ROS (+)   Anesthesia Other Findings Right lung mass   Reproductive/Obstetrics                             Anesthesia Physical Anesthesia Plan  ASA: 2  Anesthesia Plan: General   Post-op Pain Management:    Induction: Intravenous  PONV Risk Score and Plan: 3 and Ondansetron, Dexamethasone and Treatment may vary due to age or medical condition  Airway Management Planned: Oral ETT  Additional Equipment:   Intra-op Plan:   Post-operative Plan: Extubation in OR  Informed Consent: I have reviewed the patients History and Physical, chart, labs and discussed the procedure including the risks, benefits and alternatives for the proposed anesthesia with the patient or authorized representative who has indicated his/her understanding and acceptance.     Dental advisory given  Plan Discussed with: CRNA  Anesthesia Plan Comments:        Anesthesia Quick Evaluation

## 2023-10-24 NOTE — Discharge Instructions (Signed)
Flexible Bronchoscopy, Care After This sheet gives you information about how to care for yourself after your test. Your doctor may also give you more specific instructions. If you have problems or questions, contact your doctor. Follow these instructions at home: Eating and drinking Do not eat or drink anything (not even water) for 2 hours after your test, or until your numbing medicine (local anesthetic) wears off. When your numbness is gone and your cough and gag reflexes have come back, you may: Eat only soft foods. Slowly drink liquids. The day after the test, go back to your normal diet. Driving Do not drive for 24 hours if you were given a medicine to help you relax (sedative). Do not drive or use heavy machinery while taking prescription pain medicine. General instructions  Take over-the-counter and prescription medicines only as told by your doctor. Return to your normal activities as told. Ask what activities are safe for you. Do not use any products that have nicotine or tobacco in them. This includes cigarettes and e-cigarettes. If you need help quitting, ask your doctor. Keep all follow-up visits as told by your doctor. This is important. It is very important if you had a tissue sample (biopsy) taken. Get help right away if: You have shortness of breath that gets worse. You get light-headed. You feel like you are going to pass out (faint). You have chest pain. You cough up: More than a little blood. More blood than before. Summary Do not eat or drink anything (not even water) for 2 hours after your test, or until your numbing medicine wears off. Do not use cigarettes. Do not use e-cigarettes. Get help right away if you have chest pain.  This information is not intended to replace advice given to you by your health care provider. Make sure you discuss any questions you have with your health care provider. Document Released: 08/22/2009 Document Revised: 10/07/2017 Document  Reviewed: 11/12/2016 Elsevier Patient Education  2020 Else[employer] Inc.  

## 2023-10-24 NOTE — Op Note (Signed)
Video Bronchoscopy with Endobronchial Ultrasound Procedure Note  Date of Operation: 10/24/2023  Pre-op Diagnosis: lung mass, adenopathy   Post-op Diagnosis: lung mass, adenopathy   Surgeon: Josephine Igo, DO   Assistants: None   Anesthesia: General endotracheal anesthesia  Operation: Flexible video fiberoptic bronchoscopy with endobronchial ultrasound and biopsies.  Estimated Blood Loss: Minimal  Complications: None   Indications and History: Tami Shea is a 77 y.o. female with lung mass, adenopathy.  The risks, benefits, complications, treatment options and expected outcomes were discussed with the patient.  The possibilities of pneumothorax, pneumonia, reaction to medication, pulmonary aspiration, perforation of a viscus, bleeding, failure to diagnose a condition and creating a complication requiring transfusion or operation were discussed with the patient who freely signed the consent.    Description of Procedure: The patient was examined in the preoperative area and history and data from the preprocedure consultation were reviewed. It was deemed appropriate to proceed.  The [facility], identified as [name] and the procedure verified as Flexible Video Fiberoptic Bronchoscopy.  A Time Out was held and the above information confirmed. After being taken to the operating room general anesthesia was initiated and the patient  was orally intubated. The video fiberoptic bronchoscope was introduced via the endotracheal tube and a general inspection was performed which showed compressed RLL airway, extrinsically, no visible endobronchial tumor. Left lung appears normal. The standard scope was then withdrawn and the endobronchial ultrasound was used to identify and characterize the peritracheal, hilar and bronchial lymph nodes. Inspection showed station 4R and right hilar mass both visible. Using real-time ultrasound guidance Wang needle biopsies were take from Station  4R and right hilar mass nodes and were sent for cytology. The patient tolerated the procedure well without apparent complications. There was no significant blood loss. The bronchoscope was withdrawn. Anesthesia was reversed and the patient was taken to the PACU for recovery.   Samples: 1. Wang needle biopsies from 4R node 2. Wang needle biopsies from right hilar mass   Plans:  The patient will be discharged from the PACU to home when recovered from anesthesia. We will review the cytology, pathology results with the patient when they become available. Outpatient followup will be with Josephine Igo, DO.   Josephine Igo, DO [facility] 10/24/2023 9:55 AM

## 2023-10-24 NOTE — Telephone Encounter (Signed)
Patient aware of appt

## 2023-10-25 ENCOUNTER — Other Ambulatory Visit: Payer: Self-pay

## 2023-10-25 ENCOUNTER — Telehealth: Payer: Self-pay | Admitting: Internal Medicine

## 2023-10-25 DIAGNOSIS — R918 Other nonspecific abnormal finding of lung field: Secondary | ICD-10-CM

## 2023-10-25 NOTE — Telephone Encounter (Signed)
Called to get the patient scheduled per 12/17 secure chat. During the call, it was made known that Tami Shea has passed away. Unable to confirm birthday or anything before the phone was hung. Message sent to the navigator with above information.

## 2023-10-26 ENCOUNTER — Encounter (HOSPITAL_COMMUNITY)
Admission: RE | Admit: 2023-10-26 | Discharge: 2023-10-26 | Disposition: A | Discharge status: Home / Self Care | Payer: Medicare Other | Source: Ambulatory Visit | Attending: Pulmonary Disease | Admitting: Pulmonary Disease

## 2023-10-26 DIAGNOSIS — Z720 Tobacco use: Secondary | ICD-10-CM | POA: Insufficient documentation

## 2023-10-26 DIAGNOSIS — R918 Other nonspecific abnormal finding of lung field: Secondary | ICD-10-CM | POA: Insufficient documentation

## 2023-10-26 LAB — GLUCOSE, CAPILLARY: Glucose-Capillary: 91 mg/dL (ref 70–99)

## 2023-10-26 MED ORDER — FLUDEOXYGLUCOSE F - 18 (FDG) INJECTION
7.7000 | Freq: Once | INTRAVENOUS | Status: AC | PRN
  Administered 2023-10-26: 7.7 via INTRAVENOUS

## 2023-10-26 NOTE — Progress Notes (Signed)
I reached out to pt to see if she would be able to come to a consult appt with Dr Arbutus Ped on 12/24. No answer at home phone. LVM with my number requesting she call me back.

## 2023-10-27 ENCOUNTER — Encounter (HOSPITAL_COMMUNITY): Payer: Self-pay | Admitting: Pulmonary Disease

## 2023-10-28 ENCOUNTER — Other Ambulatory Visit: Payer: Self-pay

## 2023-10-28 ENCOUNTER — Telehealth: Payer: Medicare Other | Admitting: Acute Care

## 2023-10-28 ENCOUNTER — Telehealth: Payer: Self-pay | Admitting: Acute Care

## 2023-10-28 DIAGNOSIS — R918 Other nonspecific abnormal finding of lung field: Secondary | ICD-10-CM

## 2023-10-28 LAB — CYTOLOGY - NON PAP

## 2023-10-28 NOTE — Telephone Encounter (Signed)
Left patient a VM for need to  reschedule 10/28/23 video visit to 11/11/23 due to bronch results are not completed.  Kandice Robinsons, NP requests new video visit Friday Nov 11, 2023 in the afternoon.  Patient will not have video visit Oct 28, 2023.

## 2023-10-28 NOTE — Progress Notes (Signed)
I was able to reach the pt at this time. Spoke to her and her daughter, Tami Shea, on speaker phone. I offered the consult appt on 12/24 at 11:45 to see Dr Arbutus Ped with labs at 11:15. Pt accepted appt date/time. I notified new patient scheduler. Pt aware of date and time of the appt and where we are located. Pt has a ride to the appt and is aware of visitation policy. Neither the pt nor Ramona had any questions or concerns at this time.

## 2023-10-30 DIAGNOSIS — C3490 Malignant neoplasm of unspecified part of unspecified bronchus or lung: Secondary | ICD-10-CM | POA: Diagnosis not present

## 2023-11-01 ENCOUNTER — Inpatient Hospital Stay: Payer: Medicare Other | Admitting: Internal Medicine

## 2023-11-01 ENCOUNTER — Inpatient Hospital Stay: Payer: Medicare Other | Attending: Internal Medicine

## 2023-11-01 ENCOUNTER — Inpatient Hospital Stay: Payer: Medicare Other

## 2023-11-01 VITALS — BP 118/82 | HR 76 | Temp 97.9°F | Resp 16 | Ht 64.0 in | Wt 151.7 lb

## 2023-11-01 DIAGNOSIS — Z87442 Personal history of urinary calculi: Secondary | ICD-10-CM | POA: Diagnosis not present

## 2023-11-01 DIAGNOSIS — Z87891 Personal history of nicotine dependence: Secondary | ICD-10-CM | POA: Insufficient documentation

## 2023-11-01 DIAGNOSIS — C3431 Malignant neoplasm of lower lobe, right bronchus or lung: Secondary | ICD-10-CM | POA: Diagnosis not present

## 2023-11-01 DIAGNOSIS — C7972 Secondary malignant neoplasm of left adrenal gland: Secondary | ICD-10-CM | POA: Diagnosis not present

## 2023-11-01 DIAGNOSIS — I10 Essential (primary) hypertension: Secondary | ICD-10-CM | POA: Insufficient documentation

## 2023-11-01 DIAGNOSIS — R918 Other nonspecific abnormal finding of lung field: Secondary | ICD-10-CM

## 2023-11-01 DIAGNOSIS — C3490 Malignant neoplasm of unspecified part of unspecified bronchus or lung: Secondary | ICD-10-CM | POA: Diagnosis not present

## 2023-11-01 DIAGNOSIS — C3491 Malignant neoplasm of unspecified part of right bronchus or lung: Secondary | ICD-10-CM | POA: Diagnosis not present

## 2023-11-01 LAB — CBC WITH DIFFERENTIAL (CANCER CENTER ONLY)
Abs Immature Granulocytes: 0.03 10*3/uL (ref 0.00–0.07)
Basophils Absolute: 0 10*3/uL (ref 0.0–0.1)
Basophils Relative: 0 %
Eosinophils Absolute: 0 10*3/uL (ref 0.0–0.5)
Eosinophils Relative: 0 %
HCT: 36.3 % (ref 36.0–46.0)
Hemoglobin: 11.6 g/dL — ABNORMAL LOW (ref 12.0–15.0)
Immature Granulocytes: 0 %
Lymphocytes Relative: 22 %
Lymphs Abs: 1.8 10*3/uL (ref 0.7–4.0)
MCH: 25.2 pg — ABNORMAL LOW (ref 26.0–34.0)
MCHC: 32 g/dL (ref 30.0–36.0)
MCV: 78.9 fL — ABNORMAL LOW (ref 80.0–100.0)
Monocytes Absolute: 0.7 10*3/uL (ref 0.1–1.0)
Monocytes Relative: 8 %
Neutro Abs: 5.8 10*3/uL (ref 1.7–7.7)
Neutrophils Relative %: 70 %
Platelet Count: 218 10*3/uL (ref 150–400)
RBC: 4.6 MIL/uL (ref 3.87–5.11)
RDW: 17.2 % — ABNORMAL HIGH (ref 11.5–15.5)
WBC Count: 8.4 10*3/uL (ref 4.0–10.5)
nRBC: 0 % (ref 0.0–0.2)

## 2023-11-01 LAB — CMP (CANCER CENTER ONLY)
ALT: 6 U/L (ref 0–44)
AST: 13 U/L — ABNORMAL LOW (ref 15–41)
Albumin: 3.3 g/dL — ABNORMAL LOW (ref 3.5–5.0)
Alkaline Phosphatase: 47 U/L (ref 38–126)
Anion gap: 8 (ref 5–15)
BUN: 16 mg/dL (ref 8–23)
CO2: 29 mmol/L (ref 22–32)
Calcium: 8.9 mg/dL (ref 8.9–10.3)
Chloride: 104 mmol/L (ref 98–111)
Creatinine: 1.43 mg/dL — ABNORMAL HIGH (ref 0.44–1.00)
GFR, Estimated: 38 mL/min — ABNORMAL LOW (ref >60)
Glucose, Bld: 102 mg/dL — ABNORMAL HIGH (ref 70–99)
Potassium: 3.3 mmol/L — ABNORMAL LOW (ref 3.5–5.1)
Sodium: 141 mmol/L (ref 135–145)
Total Bilirubin: 0.6 mg/dL (ref <1.2)
Total Protein: 8.3 g/dL — ABNORMAL HIGH (ref 6.5–8.1)

## 2023-11-01 MED ORDER — METHYLPREDNISOLONE 4 MG PO TBPK
ORAL_TABLET | ORAL | 0 refills | Status: DC
Start: 2023-11-01 — End: 2023-12-01

## 2023-11-01 NOTE — Progress Notes (Signed)
[facility] Telephone:([phone]) [phone]   Fax:([phone]) [phone]  CONSULT NOTE  REFERRING PHYSICIAN: Dr. Elige Radon Icard  REASON FOR CONSULTATION:  77 years old African-American female recently diagnosed with lung cancer.  HPI Tami Shea is a 77 y.o. female came to the clinic today for initial evaluation accompanied by her daughter Christella Hartigan.Discussed the use of AI scribe software for clinical note transcription with the patient, who gave verbal consent to proceed.  History of Present Illness   The patient, a 77 year old with a history of kidney stones and hypertension, presents with a recent diagnosis of non-small cell lung cancer (adenocarcinoma). The diagnosis was made following a persistent cough of approximately one year's duration, which prompted a CT scan of the lungs in November. The scan revealed multiple nodules in the lungs, with the larger ones on the right side, and lymph nodes in the chest. A subsequent bronchoscopy and EBUS procedure confirmed the diagnosis.  The patient reports a recent weight loss of approximately 30 pounds over the past two years, which was noted by family members. The patient also reports a sensation of tightness in the right side of the chest, which was noticed after the bronchoscopy procedure. The cough is described as dry and has reportedly decreased since the patient quit smoking about five weeks ago, after a 52-year smoking history.  The patient has a family history of cancer, with one sister having had lung cancer and two sisters having had breast cancer. The patient has never been married and has three living children. The patient previously worked in [employer].  The patient also reports a history of kidney stones, which were discovered during an MRI scan about ten years ago. At that time, a spot was also noted on the lung, but the patient's focus was on the kidney stones. The patient has also undergone a YAG procedure for the  eyes.  The patient denies any nausea, vomiting, diarrhea, abdominal pain, headaches, or changes in vision. The patient also denies any history of alcohol or drug abuse, apart from past marijuana use. The patient has no known drug allergies.   RADIOLOGY CT scan of the lung: Multiple nodules in the lung, larger on the right than the left, with lymphadenopathy in the chest. (09/26/2023) PET scan: Large right infrahilar mass, right hilar lymph node involvement, mediastinal lymph node involvement, and metastatic disease to the left adrenal gland. (10/25/2023) MRI: Two renal calculi.  DIAGNOSTIC Bronchoscopy with EBUS: Non-small cell lung cancer adenocarcinoma.      HPI  Past Medical History:  Diagnosis Date   Dyspnea    History of kidney stones    HTN (hypertension)    Smoker     Past Surgical History:  Procedure Laterality Date   BRONCHIAL NEEDLE ASPIRATION BIOPSY  10/24/2023   Procedure: BRONCHIAL NEEDLE ASPIRATION BIOPSIES;  Surgeon: Josephine Igo, DO;  Location: [facility];  Service: Pulmonary;;   COLONOSCOPY     PILONIDAL CYST EXCISION     tonisllectomy     VIDEO BRONCHOSCOPY WITH ENDOBRONCHIAL ULTRASOUND Right 10/24/2023   Procedure: VIDEO BRONCHOSCOPY WITH ENDOBRONCHIAL ULTRASOUND;  Surgeon: Josephine Igo, DO;  Location: [facility];  Service: Pulmonary;  Laterality: Right;    Family History  Problem Relation Age of Onset   Kidney disease Mother    Heart disease Father     Social History Social History   Tobacco Use   Smoking status: Former    Current packs/Weichel: 0.00    Types: Cigarettes  Quit date: 09/24/2023    Years since quitting: 0.1   Smokeless tobacco: Never  Substance Use Topics   Alcohol use: Not Currently   Drug use: Not Currently    Types: Marijuana    No Known Allergies  Current Outpatient Medications  Medication Sig Dispense Refill   amLODipine (NORVASC) 10 MG tablet 1 tablet     Chlorphen-PE-Acetaminophen 4-10-325 MG TABS Take 1  tablet by mouth 4 (four) times daily. 20 tablet 1   Cholecalciferol (VITAMIN D3) 1.25 MG (50000 UT) CAPS Take 1 capsule by mouth once a week.     clotrimazole-betamethasone (LOTRISONE) cream Apply topically 2 (two) times daily. (Patient not taking: Reported on 10/20/2023)     methylPREDNISolone (MEDROL DOSEPAK) 4 MG TBPK tablet Take as directed (Patient not taking: Reported on 10/17/2023) 21 tablet 0   telmisartan-hydrochlorothiazide (MICARDIS HCT) 80-25 MG tablet  (Patient not taking: Reported on 10/20/2023)     No current facility-administered medications for this visit.    Review of Systems  Constitutional: positive for anorexia, fatigue, and weight loss Eyes: negative Ears, nose, mouth, throat, and face: negative Respiratory: positive for cough and pleurisy/chest pain Cardiovascular: negative Gastrointestinal: negative Genitourinary:negative Integument/breast: negative Hematologic/lymphatic: negative Musculoskeletal:negative Neurological: negative Behavioral/Psych: negative Endocrine: negative Allergic/Immunologic: negative  Physical Exam  ZOX:WRUEA, healthy, no distress, well nourished, well developed, and anxious SKIN: skin color, texture, turgor are normal, no rashes or significant lesions HEAD: Normocephalic, No masses, lesions, tenderness or abnormalities EYES: normal, PERRLA, Conjunctiva are pink and non-injected EARS: External ears normal, Canals clear OROPHARYNX:no exudate, no erythema, and lips, buccal mucosa, and tongue normal  NECK: supple, no adenopathy, no JVD LYMPH:  no palpable lymphadenopathy, no hepatosplenomegaly BREAST:not examined LUNGS: clear to auscultation , and palpation HEART: regular rate & rhythm, no murmurs, and no gallops ABDOMEN:abdomen soft, non-tender, normal bowel sounds, and no masses or organomegaly BACK: Back symmetric, no curvature., No CVA tenderness EXTREMITIES:no joint deformities, effusion, or inflammation, no edema  NEURO: alert &  oriented x 3 with fluent speech, no focal motor/sensory deficits  PERFORMANCE STATUS: ECOG 1  LABORATORY DATA: Lab Results  Component Value Date   WBC 8.4 11/01/2023   HGB 11.6 (L) 11/01/2023   HCT 36.3 11/01/2023   MCV 78.9 (L) 11/01/2023   PLT 218 11/01/2023      Chemistry      Component Value Date/Time   NA 141 11/01/2023 1110   NA 142 10/12/2021 1043   K 3.3 (L) 11/01/2023 1110   CL 104 11/01/2023 1110   CO2 29 11/01/2023 1110   BUN 16 11/01/2023 1110   BUN 17 10/12/2021 1043   CREATININE 1.43 (H) 11/01/2023 1110      Component Value Date/Time   CALCIUM 8.9 11/01/2023 1110   ALKPHOS 47 11/01/2023 1110   AST 13 (L) 11/01/2023 1110   ALT 6 11/01/2023 1110   BILITOT 0.6 11/01/2023 1110       RADIOGRAPHIC STUDIES: NM PET Image Initial (PI) Skull Base To Thigh (F-18 FDG) Result Date: 10/31/2023 CLINICAL DATA:  Initial treatment strategy for pulmonary nodules and adenopathy on lung cancer screening CT, suspicious for lung cancer. EXAM: NUCLEAR MEDICINE PET SKULL BASE TO THIGH TECHNIQUE: 7.7 mCi F-18 FDG was injected intravenously. Full-ring PET imaging was performed from the skull base to thigh after the radiotracer. CT data was obtained and used for attenuation correction and anatomic localization. Fasting blood glucose: 91 mg/dl COMPARISON:  Chest CT 54/07/8118 and 03/07/2014. Abdominal CT 11/13/2014. FINDINGS: Mediastinal blood pool activity: SUV max  2.1 NECK: No hypermetabolic cervical lymph nodes are identified. No suspicious activity identified within the pharyngeal mucosal space. Incidental CT findings: Bilateral carotid atherosclerosis. CHEST: There is hypermetabolic right paratracheal, subcarinal and right hilar adenopathy. 1.5 cm right paratracheal node on image 53/4 has an SUV max of 9.9. There is a larger right hilar lymph node with an SUV max of 9.7. Large right infrahilar mass is partially obscured by adjacent lung collapse, although the hypermetabolic activity  measures approximately 7.4 x 5.6 cm transverse and has an SUV max of 19.0. There does not appear to be significant hypermetabolic activity within the previously demonstrated right middle lobe nodule, although this is not well visualized due to progressive right middle and lower lobe collapse. No hypermetabolic pulmonary activity or suspicious nodularity in the left lung. Incidental CT findings: As above, progressive right middle and lower lobe collapse compared with recent chest CT. There is a small to moderate right pleural effusion. Cardiomegaly and a significant pericardial effusion are unchanged from the recent CT. There is diffuse atherosclerosis of the aorta, great vessels and coronary arteries. ABDOMEN/PELVIS: Significantly hypermetabolic left adrenal nodule (SUV max 16.7), measures approximately 1.3 cm on image 87/4, highly suspicious for metastatic disease. Nonspecific low-level (SUV max 4.8) hypermetabolic activity within the right adrenal gland. No hypermetabolic activity identified within the liver, spleen or pancreas. There is no hypermetabolic nodal activity in the abdomen or pelvis. Mildly prominent activity within the cecum (SUV max 5.3) without clear corresponding abnormality on the CT images. Incidental CT findings: Large nonobstructing left renal calculus, measuring 1.7 cm on image 98/4. No hydronephrosis. Aortic and branch vessel atherosclerosis without evidence of aneurysm. SKELETON: There is no hypermetabolic activity to suggest osseous metastatic disease. Incidental CT findings: Multilevel spondylosis. IMPRESSION: 1. Large hypermetabolic right infrahilar mass, consistent with primary bronchogenic carcinoma. 2. Hypermetabolic right hilar and mediastinal adenopathy, consistent with metastatic disease. 3. Hypermetabolic left adrenal nodule, highly suspicious for metastatic disease. Nonspecific low-level hypermetabolic activity within the right adrenal gland. 4. No other evidence of metastatic  disease in the abdomen or pelvis. 5. Progressive right middle and lower lobe collapse compared with recent chest CT. Similar small to moderate right pleural effusion. 6. Cardiomegaly and significant pericardial effusion, unchanged from recent chest CT. 7. Large nonobstructing left renal calculus. 8.  Aortic Atherosclerosis (ICD10-I70.0). Electronically Signed   By: Carey Bullocks M.D.   On: 10/31/2023 16:44    ASSESSMENT: This is a very pleasant 77 years old African-American female diagnosed with stage IV (T4, N2, M1 B) non-small cell lung cancer, adenocarcinoma presented with large right infrahilar mass in addition to right hilar and mediastinal lymphadenopathy as well as left adrenal gland metastasis diagnosed in December 2024. Molecular studies and PD-L1 expression are still pending.   PLAN: I had a lengthy discussion with the patient and her daughter today about her current disease stage, prognosis and treatment options.  I personally and independently reviewed the scan images and discussed the result and showed the images to the patient and her daughter today.    Stage IV (T4, N2, M1b) Non-Small Cell Lung Cancer (NSCLC) - Adenocarcinoma Diagnosed with NSCLC, adenocarcinoma subtype, confirmed by bronchoscopy and EBUS. PET scan shows a large right infrahilar mass (~8 cm), mediastinal lymph node involvement, and metastasis to the left adrenal gland, indicating stage IV disease. Symptoms include chronic dry cough, right-sided chest tightness, and significant weight loss (~30 lbs over two years). Discussed treatment options including chemotherapy, immunotherapy, and potential targeted therapy based on molecular markers. Surgery is  not an option due to metastasis. Risks and benefits of treatments were discussed, including potential side effects and life expectancy with and without treatment. Without treatment, life expectancy is 3-6 months; with treatment, approximately 2 years, and potentially several  years if molecular markers are positive. Discussed the possibility of using steroids to improve appetite and weight gain. - Order MRI of the brain to check for metastasis - Await results of molecular marker testing from [facility] - Consider additional blood test for molecular study with [facility] - Discuss potential use of steroids to improve appetite and weight gain - Recommend nutritional supplements like Ensure or Boost  Hypertension Hypertension, currently managed with medication. No recent follow-up with a cardiologist. - Continue current antihypertensive medication - Monitor blood pressure regularly  Kidney Stones Recent passage of one kidney stone. No current symptoms. - Monitor for recurrence of symptoms - Maintain adequate hydration  General Health Maintenance None - Encourage balanced diet and adequate hydration  Follow-up - Schedule follow-up appointment in less than three weeks to review MRI and molecular study results - Coordinate with radiation oncologist for potential palliative radiation - Monitor for new symptoms or changes in condition.   The patient was advised to call immediately if she has any other concerning symptoms in the interval. The patient voices understanding of current disease status and treatment options and is in agreement with the current care plan.  All questions were answered. The patient knows to call the clinic with any problems, questions or concerns. We can certainly see the patient much sooner if necessary.  Thank you so much for allowing me to participate in the care of Tami Shea. I will continue to follow up the patient with you and assist in her care.  The total time spent in the appointment was 90 minutes.  Disclaimer: This note was dictated with voice recognition software. Similar sounding words can inadvertently be transcribed and may not be corrected upon review.   Lajuana Matte November 01, 2023, 11:59 AM

## 2023-11-03 NOTE — Progress Notes (Signed)
I met the pt face to face today at her medical oncology consult with Dr. Arbutus Ped. Pt was accompanied by her daughter, Christella Hartigan. Pt found out today that she has stage IV adenocarcinoma of the lung. To complete the pt's staging work up, pt needs a brain MRI.  I emailed request for the pt's tissue be sent to [facility] today to Lita Mains, [facility] path tech, and pt will have [facility] blood draw sent after her appt today.  Treatment will be determined based on the results of the molecular studies on the pt's tissue.  I provided the pt and her daughter with the lung cancer journey book and provided my business card with my contact information. No questions at the conclusion of the consultation Daughter was visibly and understandably upset. I offered to refer her to our spiritual counselor, Rush Barer. Pt's daughter accepted the offer. I let her know I will reach out to [facility].

## 2023-11-04 ENCOUNTER — Ambulatory Visit (HOSPITAL_COMMUNITY): Payer: Medicare Other

## 2023-11-07 ENCOUNTER — Other Ambulatory Visit: Payer: Self-pay | Admitting: Internal Medicine

## 2023-11-07 ENCOUNTER — Ambulatory Visit (HOSPITAL_COMMUNITY)
Admission: RE | Admit: 2023-11-07 | Discharge: 2023-11-07 | Disposition: A | Discharge status: Home / Self Care | Payer: Medicare Other | Source: Ambulatory Visit | Attending: Internal Medicine | Admitting: Internal Medicine

## 2023-11-07 DIAGNOSIS — C3490 Malignant neoplasm of unspecified part of unspecified bronchus or lung: Secondary | ICD-10-CM | POA: Insufficient documentation

## 2023-11-07 DIAGNOSIS — C3431 Malignant neoplasm of lower lobe, right bronchus or lung: Secondary | ICD-10-CM

## 2023-11-07 MED ORDER — GADOBUTROL 1 MMOL/ML IV SOLN: 7.0000 mL | Freq: Once | INTRAVENOUS | Status: DC | PRN

## 2023-11-10 ENCOUNTER — Other Ambulatory Visit: Payer: Self-pay

## 2023-11-11 ENCOUNTER — Encounter (HOSPITAL_COMMUNITY): Payer: Self-pay

## 2023-11-11 ENCOUNTER — Ambulatory Visit: Payer: Medicare Other | Admitting: Acute Care

## 2023-11-11 NOTE — Progress Notes (Signed)
 The proposed treatment discussed in conference is for discussion purpose only and is not a binding recommendation.  The patients have not been physically examined, or presented with their treatment options.  Therefore, final treatment plans cannot be decided.

## 2023-11-12 DIAGNOSIS — C3492 Malignant neoplasm of unspecified part of left bronchus or lung: Secondary | ICD-10-CM | POA: Diagnosis not present

## 2023-11-14 ENCOUNTER — Encounter (HOSPITAL_COMMUNITY): Payer: Self-pay

## 2023-11-15 ENCOUNTER — Inpatient Hospital Stay: Payer: Medicare Other | Admitting: Internal Medicine

## 2023-11-15 ENCOUNTER — Inpatient Hospital Stay: Payer: Medicare Other | Attending: Internal Medicine

## 2023-11-15 VITALS — BP 118/77 | HR 76 | Temp 98.3°F | Resp 17 | Ht 64.0 in | Wt 147.5 lb

## 2023-11-15 DIAGNOSIS — Z5111 Encounter for antineoplastic chemotherapy: Secondary | ICD-10-CM | POA: Insufficient documentation

## 2023-11-15 DIAGNOSIS — Z5112 Encounter for antineoplastic immunotherapy: Secondary | ICD-10-CM | POA: Insufficient documentation

## 2023-11-15 DIAGNOSIS — C3491 Malignant neoplasm of unspecified part of right bronchus or lung: Secondary | ICD-10-CM | POA: Insufficient documentation

## 2023-11-15 DIAGNOSIS — Z87891 Personal history of nicotine dependence: Secondary | ICD-10-CM | POA: Diagnosis not present

## 2023-11-15 DIAGNOSIS — Z79899 Other long term (current) drug therapy: Secondary | ICD-10-CM | POA: Insufficient documentation

## 2023-11-15 DIAGNOSIS — C3431 Malignant neoplasm of lower lobe, right bronchus or lung: Secondary | ICD-10-CM

## 2023-11-15 DIAGNOSIS — R918 Other nonspecific abnormal finding of lung field: Secondary | ICD-10-CM

## 2023-11-15 DIAGNOSIS — C7972 Secondary malignant neoplasm of left adrenal gland: Secondary | ICD-10-CM | POA: Insufficient documentation

## 2023-11-15 LAB — CBC WITH DIFFERENTIAL (CANCER CENTER ONLY)
Abs Immature Granulocytes: 0.02 10*3/uL (ref 0.00–0.07)
Basophils Absolute: 0 10*3/uL (ref 0.0–0.1)
Basophils Relative: 0 %
Eosinophils Absolute: 0 10*3/uL (ref 0.0–0.5)
Eosinophils Relative: 1 %
HCT: 36.6 % (ref 36.0–46.0)
Hemoglobin: 11.5 g/dL — ABNORMAL LOW (ref 12.0–15.0)
Immature Granulocytes: 0 %
Lymphocytes Relative: 23 %
Lymphs Abs: 2 10*3/uL (ref 0.7–4.0)
MCH: 24.7 pg — ABNORMAL LOW (ref 26.0–34.0)
MCHC: 31.4 g/dL (ref 30.0–36.0)
MCV: 78.5 fL — ABNORMAL LOW (ref 80.0–100.0)
Monocytes Absolute: 0.8 10*3/uL (ref 0.1–1.0)
Monocytes Relative: 9 %
Neutro Abs: 5.8 10*3/uL (ref 1.7–7.7)
Neutrophils Relative %: 67 %
Platelet Count: 282 10*3/uL (ref 150–400)
RBC: 4.66 MIL/uL (ref 3.87–5.11)
RDW: 17.1 % — ABNORMAL HIGH (ref 11.5–15.5)
WBC Count: 8.8 10*3/uL (ref 4.0–10.5)
nRBC: 0 % (ref 0.0–0.2)

## 2023-11-15 LAB — CMP (CANCER CENTER ONLY)
ALT: 6 U/L (ref 0–44)
AST: 14 U/L — ABNORMAL LOW (ref 15–41)
Albumin: 3.4 g/dL — ABNORMAL LOW (ref 3.5–5.0)
Alkaline Phosphatase: 50 U/L (ref 38–126)
Anion gap: 8 (ref 5–15)
BUN: 16 mg/dL (ref 8–23)
CO2: 30 mmol/L (ref 22–32)
Calcium: 9.4 mg/dL (ref 8.9–10.3)
Chloride: 102 mmol/L (ref 98–111)
Creatinine: 1.28 mg/dL — ABNORMAL HIGH (ref 0.44–1.00)
GFR, Estimated: 43 mL/min — ABNORMAL LOW (ref >60)
Glucose, Bld: 93 mg/dL (ref 70–99)
Potassium: 3.6 mmol/L (ref 3.5–5.1)
Sodium: 140 mmol/L (ref 135–145)
Total Bilirubin: 0.5 mg/dL (ref 0.0–1.2)
Total Protein: 8.7 g/dL — ABNORMAL HIGH (ref 6.5–8.1)

## 2023-11-15 NOTE — Progress Notes (Signed)
 [facility] [phone]   [phone]  OFFICE PROGRESS NOTE  [name], MD [address] [place] [address]  DIAGNOSIS: stage IV (T4, N2, M1 B) non-small cell lung cancer, adenocarcinoma presented with large right infrahilar mass in addition to right hilar and mediastinal lymphadenopathy as well as left adrenal gland metastasis diagnosed in December 2024.   Biomarker Findings Tumor Mutational Burden - 24 Muts/Mb HRD signature - HRDsig Negative Microsatellite status - MS-Stable Genomic Findings For a complete list of the genes assayed, please refer to the Appendix. BRAF G464V KRAS G13C ASXL1 deletion exon 12 TP53 splice site [id number]>T 6 Disease relevant genes with no reportable alterations: ALK, EGFR, ERBB2, MET, RET, ROS1  PDL1 Expression: 90%  PRIOR THERAPY: None  CURRENT THERAPY: Consideration of systemic therapy either with immunotherapy as a single agent with Libtayo (Cempilimab) versus a combination of chemoimmunotherapy with carboplatin  for AUC of 5, Alimta  500 Mg/M2 and Keytruda  200 Mg IV every 3 weeks.  The patient will call with her decision.  INTERVAL HISTORY: Tami Shea 78 y.o. female returns to the clinic today for follow-up visit accompanied by her sister and son Viktoria.  The patient is feeling fine today with no concerning complaints except for occasional shortness of breath and cough.  She denied having any chest pain or hemoptysis.  She has no nausea, vomiting, diarrhea or constipation.  She has no headache or visual changes.  She denied having any night sweats but she lost few pounds since her last visit.  She had molecular studies performed by [facility] and showed no actionable mutation but PD-L1 TPS of 90%.  She is here today for evaluation and discussion of her treatment options.  MEDICAL HISTORY: Past Medical History:  Diagnosis Date   Dyspnea    History of kidney stones    HTN (hypertension)    Smoker      ALLERGIES:  has no known allergies.  MEDICATIONS:  Current Outpatient Medications  Medication Sig Dispense Refill   amLODipine  (NORVASC ) 10 MG tablet 1 tablet     Cholecalciferol (VITAMIN D3) 1.25 MG (50000 UT) CAPS Take 1 capsule by mouth once a week.     methylPREDNISolone  (MEDROL  DOSEPAK) 4 MG TBPK tablet Use as instructed 21 tablet 0   No current facility-administered medications for this visit.    SURGICAL HISTORY:  Past Surgical History:  Procedure Laterality Date   BRONCHIAL NEEDLE ASPIRATION BIOPSY  10/24/2023   Procedure: BRONCHIAL NEEDLE ASPIRATION BIOPSIES;  Surgeon: Brenna Adine CROME, DO;  Location: [facility];  Service: Pulmonary;;   COLONOSCOPY     PILONIDAL CYST EXCISION     tonisllectomy     VIDEO BRONCHOSCOPY WITH ENDOBRONCHIAL ULTRASOUND Right 10/24/2023   Procedure: VIDEO BRONCHOSCOPY WITH ENDOBRONCHIAL ULTRASOUND;  Surgeon: Brenna Adine CROME, DO;  Location: [facility];  Service: Pulmonary;  Laterality: Right;    REVIEW OF SYSTEMS:  Constitutional: positive for fatigue and weight loss Eyes: negative Ears, nose, mouth, throat, and face: negative Respiratory: positive for cough and dyspnea on exertion Cardiovascular: negative Gastrointestinal: negative Genitourinary:negative Integument/breast: negative Hematologic/lymphatic: negative Musculoskeletal:negative Neurological: negative Behavioral/Psych: negative Endocrine: negative Allergic/Immunologic: negative   PHYSICAL EXAMINATION: General appearance: alert, cooperative, fatigued, and no distress Head: Normocephalic, without obvious abnormality, atraumatic Neck: no adenopathy, no JVD, supple, symmetrical, trachea midline, and thyroid  not enlarged, symmetric, no tenderness/mass/nodules Lymph nodes: Cervical, supraclavicular, and axillary nodes normal. Resp: clear to auscultation bilaterally Back: symmetric, no curvature. ROM normal. No CVA  tenderness. Cardio: regular rate and rhythm, S1, S2 normal,  no murmur, click, rub or gallop GI: soft, non-tender; bowel sounds normal; no masses,  no organomegaly Extremities: extremities normal, atraumatic, no cyanosis or edema Neurologic: Alert and oriented X 3, normal strength and tone. Normal symmetric reflexes. Normal coordination and gait  ECOG PERFORMANCE STATUS: 1 - Symptomatic but completely ambulatory  Blood pressure (!) 146/102, pulse 76, temperature 98.3 F (36.8 C), temperature source Temporal, resp. rate 17, height 5' 4 (1.626 m), weight 147 lb 8 oz (66.9 kg), SpO2 100%.  LABORATORY DATA: Lab Results  Component Value Date   WBC 8.8 11/15/2023   HGB 11.5 (L) 11/15/2023   HCT 36.6 11/15/2023   MCV 78.5 (L) 11/15/2023   PLT 282 11/15/2023      Chemistry      Component Value Date/Time   NA 141 11/01/2023 1110   NA 142 10/12/2021 1043   K 3.3 (L) 11/01/2023 1110   CL 104 11/01/2023 1110   CO2 29 11/01/2023 1110   BUN 16 11/01/2023 1110   BUN 17 10/12/2021 1043   CREATININE 1.43 (H) 11/01/2023 1110      Component Value Date/Time   CALCIUM 8.9 11/01/2023 1110   ALKPHOS 47 11/01/2023 1110   AST 13 (L) 11/01/2023 1110   ALT 6 11/01/2023 1110   BILITOT 0.6 11/01/2023 1110       RADIOGRAPHIC STUDIES: MR BRAIN WO CONTRAST Result Date: 11/07/2023 CLINICAL DATA:  Non-small cell lung carcinoma staging EXAM: MRI HEAD WITHOUT CONTRAST TECHNIQUE: Multiplanar, multiecho pulse sequences of the brain and surrounding structures were obtained without intravenous contrast. COMPARISON:  None Available. FINDINGS: The examination was terminated by the patient prior to completion. 4 sequences were performed. Brain: There is a small, linear focus of abnormal diffusion restriction at the anterior aspect of the left lentiform nucleus. There is a second, punctate focus of diffusion restriction along the left middle frontal gyrus. No hemorrhage or extra-axial collection. There is multifocal hyperintense T2-weighted signal within the periventricular  and deep white matter. Major midline structures are normal. Vascular: Normal flow voids Skull and upper cervical spine: Negative Sinuses/Orbits: Mild maxillary sinus mucosal thickening. Normal orbits. Other: None IMPRESSION: 1. The examination was terminated by the patient prior to completion. No IV contrast agent was administered. 2. Two small foci of diffusion restriction in the right hemisphere, likely acute/early subacute infarcts. These results will be called to the ordering clinician or representative by the Radiologist Assistant, and communication documented in the PACS or [employer]. Electronically Signed   By: Franky Stanford M.D.   On: 11/07/2023 20:07   NM PET Image Initial (PI) Skull Base To Thigh (F-18 FDG) Result Date: 10/31/2023 CLINICAL DATA:  Initial treatment strategy for pulmonary nodules and adenopathy on lung cancer screening CT, suspicious for lung cancer. EXAM: NUCLEAR MEDICINE PET SKULL BASE TO THIGH TECHNIQUE: 7.7 mCi F-18 FDG was injected intravenously. Full-ring PET imaging was performed from the skull base to thigh after the radiotracer. CT data was obtained and used for attenuation correction and anatomic localization. Fasting blood glucose: 91 mg/dl COMPARISON:  Chest CT 88/81/7975 and 03/07/2014. Abdominal CT 11/13/2014. FINDINGS: Mediastinal blood pool activity: SUV max 2.1 NECK: No hypermetabolic cervical lymph nodes are identified. No suspicious activity identified within the pharyngeal mucosal space. Incidental CT findings: Bilateral carotid atherosclerosis. CHEST: There is hypermetabolic right paratracheal, subcarinal and right hilar adenopathy. 1.5 cm right paratracheal node on image 53/4 has an SUV max of 9.9. There is a larger right  hilar lymph node with an SUV max of 9.7. Large right infrahilar mass is partially obscured by adjacent lung collapse, although the hypermetabolic activity measures approximately 7.4 x 5.6 cm transverse and has an SUV max of 19.0. There does  not appear to be significant hypermetabolic activity within the previously demonstrated right middle lobe nodule, although this is not well visualized due to progressive right middle and lower lobe collapse. No hypermetabolic pulmonary activity or suspicious nodularity in the left lung. Incidental CT findings: As above, progressive right middle and lower lobe collapse compared with recent chest CT. There is a small to moderate right pleural effusion. Cardiomegaly and a significant pericardial effusion are unchanged from the recent CT. There is diffuse atherosclerosis of the aorta, great vessels and coronary arteries. ABDOMEN/PELVIS: Significantly hypermetabolic left adrenal nodule (SUV max 16.7), measures approximately 1.3 cm on image 87/4, highly suspicious for metastatic disease. Nonspecific low-level (SUV max 4.8) hypermetabolic activity within the right adrenal gland. No hypermetabolic activity identified within the liver, spleen or pancreas. There is no hypermetabolic nodal activity in the abdomen or pelvis. Mildly prominent activity within the cecum (SUV max 5.3) without clear corresponding abnormality on the CT images. Incidental CT findings: Large nonobstructing left renal calculus, measuring 1.7 cm on image 98/4. No hydronephrosis. Aortic and branch vessel atherosclerosis without evidence of aneurysm. SKELETON: There is no hypermetabolic activity to suggest osseous metastatic disease. Incidental CT findings: Multilevel spondylosis. IMPRESSION: 1. Large hypermetabolic right infrahilar mass, consistent with primary bronchogenic carcinoma. 2. Hypermetabolic right hilar and mediastinal adenopathy, consistent with metastatic disease. 3. Hypermetabolic left adrenal nodule, highly suspicious for metastatic disease. Nonspecific low-level hypermetabolic activity within the right adrenal gland. 4. No other evidence of metastatic disease in the abdomen or pelvis. 5. Progressive right middle and lower lobe collapse  compared with recent chest CT. Similar small to moderate right pleural effusion. 6. Cardiomegaly and significant pericardial effusion, unchanged from recent chest CT. 7. Large nonobstructing left renal calculus. 8.  Aortic Atherosclerosis (ICD10-I70.0). Electronically Signed   By: Elsie Perone M.D.   On: 10/31/2023 16:44    ASSESSMENT AND PLAN: This is a very pleasant 78 years old African-American female with  stage IV (T4, N2, M1 B) non-small cell lung cancer, adenocarcinoma presented with large right infrahilar mass in addition to right hilar and mediastinal lymphadenopathy as well as left adrenal gland metastasis diagnosed in December 2024.  She had molecular studies by [facility] that showed no actionable mutations and PD-L1 TPS of 90%. I had a lengthy discussion with the patient and her family about her current disease stage, prognosis and treatment options. I explained to the patient that she has incurable condition and all the treatment will be of palliative nature.  I gave the patient the option of palliative care and hospice referral versus consideration of palliative systemic therapy with either immunotherapy or single agent with Libtayo (Cempilimab) 350 Mg IV every 3 weeks for up to 2 years if she has no significant adverse effect or disease progression versus a combination of chemoimmunotherapy with carboplatin  for AUC of 5, Alimta  500 Mg/M2 and Keytruda  200 Mg IV every 3 weeks for 4 cycles followed by maintenance treatment with Alimta  and Keytruda . I discussed with the patient the adverse effect of this treatment including but not limited to alopecia, myelosuppression, nausea and vomiting, peripheral neuropathy, liver or renal dysfunction as well as immunotherapy adverse effects. The patient and her family would like some time to think about her options and she will call the  office with her decision. If she decide to proceed with the systemic therapy, we will arrange for the patient to  have a Port-A-Cath placed for chemotherapy infusion.  We will also arrange for the patient to have a chemotherapy education class before the first dose of her treatment and I will send prescription of Compazine  and Emla  cream to her pharmacy. The patient was advised to call immediately if she has any other concerning symptoms in the interval. The patient voices understanding of current disease status and treatment options and is in agreement with the current care plan.  All questions were answered. The patient knows to call the clinic with any problems, questions or concerns. We can certainly see the patient much sooner if necessary.  The total time spent in the appointment was 40 minutes.  Disclaimer: This note was dictated with voice recognition software. Similar sounding words can inadvertently be transcribed and may not be corrected upon review.

## 2023-11-16 ENCOUNTER — Encounter (HOSPITAL_COMMUNITY): Payer: Self-pay

## 2023-11-22 ENCOUNTER — Telehealth: Payer: Self-pay | Admitting: Medical Oncology

## 2023-11-23 ENCOUNTER — Inpatient Hospital Stay: Payer: Medicare Other

## 2023-11-23 ENCOUNTER — Inpatient Hospital Stay: Payer: Medicare Other | Admitting: Internal Medicine

## 2023-11-23 ENCOUNTER — Ambulatory Visit: Payer: Medicare Other | Admitting: Physician Assistant

## 2023-11-23 ENCOUNTER — Encounter (HOSPITAL_COMMUNITY): Payer: Self-pay

## 2023-11-23 ENCOUNTER — Other Ambulatory Visit: Payer: Medicare Other

## 2023-11-23 NOTE — Telephone Encounter (Signed)
eerr

## 2023-11-23 NOTE — Progress Notes (Signed)
 Pts daughter called and left a message asking that the appts that was booked for today be canceled. Lab and MD appt canceled per request.

## 2023-11-30 ENCOUNTER — Other Ambulatory Visit: Payer: Self-pay | Admitting: *Deleted

## 2023-11-30 DIAGNOSIS — C3490 Malignant neoplasm of unspecified part of unspecified bronchus or lung: Secondary | ICD-10-CM

## 2023-11-30 DIAGNOSIS — C3431 Malignant neoplasm of lower lobe, right bronchus or lung: Secondary | ICD-10-CM

## 2023-12-01 ENCOUNTER — Other Ambulatory Visit: Payer: Self-pay | Admitting: Medical Oncology

## 2023-12-01 ENCOUNTER — Inpatient Hospital Stay: Payer: Medicare Other

## 2023-12-01 ENCOUNTER — Inpatient Hospital Stay: Payer: Medicare Other | Admitting: Internal Medicine

## 2023-12-01 VITALS — BP 131/83 | HR 70 | Temp 98.0°F | Resp 16 | Ht 64.0 in | Wt 147.0 lb

## 2023-12-01 DIAGNOSIS — C3431 Malignant neoplasm of lower lobe, right bronchus or lung: Secondary | ICD-10-CM | POA: Diagnosis not present

## 2023-12-01 DIAGNOSIS — Z5112 Encounter for antineoplastic immunotherapy: Secondary | ICD-10-CM | POA: Diagnosis not present

## 2023-12-01 DIAGNOSIS — C3491 Malignant neoplasm of unspecified part of right bronchus or lung: Secondary | ICD-10-CM | POA: Diagnosis not present

## 2023-12-01 DIAGNOSIS — R918 Other nonspecific abnormal finding of lung field: Secondary | ICD-10-CM

## 2023-12-01 DIAGNOSIS — Z87891 Personal history of nicotine dependence: Secondary | ICD-10-CM | POA: Diagnosis not present

## 2023-12-01 DIAGNOSIS — Z5111 Encounter for antineoplastic chemotherapy: Secondary | ICD-10-CM | POA: Diagnosis not present

## 2023-12-01 DIAGNOSIS — Z79899 Other long term (current) drug therapy: Secondary | ICD-10-CM | POA: Diagnosis not present

## 2023-12-01 DIAGNOSIS — C7972 Secondary malignant neoplasm of left adrenal gland: Secondary | ICD-10-CM | POA: Diagnosis not present

## 2023-12-01 DIAGNOSIS — C3490 Malignant neoplasm of unspecified part of unspecified bronchus or lung: Secondary | ICD-10-CM

## 2023-12-01 LAB — CMP (CANCER CENTER ONLY)
ALT: 5 U/L (ref 0–44)
AST: 13 U/L — ABNORMAL LOW (ref 15–41)
Albumin: 3.4 g/dL — ABNORMAL LOW (ref 3.5–5.0)
Alkaline Phosphatase: 47 U/L (ref 38–126)
Anion gap: 7 (ref 5–15)
BUN: 20 mg/dL (ref 8–23)
CO2: 31 mmol/L (ref 22–32)
Calcium: 9.7 mg/dL (ref 8.9–10.3)
Chloride: 101 mmol/L (ref 98–111)
Creatinine: 1.37 mg/dL — ABNORMAL HIGH (ref 0.44–1.00)
GFR, Estimated: 40 mL/min — ABNORMAL LOW (ref >60)
Glucose, Bld: 89 mg/dL (ref 70–99)
Potassium: 3.9 mmol/L (ref 3.5–5.1)
Sodium: 139 mmol/L (ref 135–145)
Total Bilirubin: 0.6 mg/dL (ref 0.0–1.2)
Total Protein: 8.5 g/dL — ABNORMAL HIGH (ref 6.5–8.1)

## 2023-12-01 LAB — CBC WITH DIFFERENTIAL (CANCER CENTER ONLY)
Abs Immature Granulocytes: 0.02 10*3/uL (ref 0.00–0.07)
Basophils Absolute: 0 10*3/uL (ref 0.0–0.1)
Basophils Relative: 0 %
Eosinophils Absolute: 0.1 10*3/uL (ref 0.0–0.5)
Eosinophils Relative: 1 %
HCT: 37.1 % (ref 36.0–46.0)
Hemoglobin: 11.3 g/dL — ABNORMAL LOW (ref 12.0–15.0)
Immature Granulocytes: 0 %
Lymphocytes Relative: 23 %
Lymphs Abs: 1.9 10*3/uL (ref 0.7–4.0)
MCH: 24.6 pg — ABNORMAL LOW (ref 26.0–34.0)
MCHC: 30.5 g/dL (ref 30.0–36.0)
MCV: 80.8 fL (ref 80.0–100.0)
Monocytes Absolute: 0.9 10*3/uL (ref 0.1–1.0)
Monocytes Relative: 10 %
Neutro Abs: 5.5 10*3/uL (ref 1.7–7.7)
Neutrophils Relative %: 66 %
Platelet Count: 206 10*3/uL (ref 150–400)
RBC: 4.59 MIL/uL (ref 3.87–5.11)
RDW: 17.2 % — ABNORMAL HIGH (ref 11.5–15.5)
WBC Count: 8.3 10*3/uL (ref 4.0–10.5)
nRBC: 0 % (ref 0.0–0.2)

## 2023-12-01 MED ORDER — FOLIC ACID 1 MG PO TABS: 1.0000 mg | ORAL_TABLET | Freq: Every day | ORAL | 3 refills | Status: DC

## 2023-12-01 MED ORDER — PROCHLORPERAZINE MALEATE 10 MG PO TABS: 10.0000 mg | ORAL_TABLET | Freq: Four times a day (QID) | ORAL | 1 refills | Status: DC | PRN

## 2023-12-01 MED ORDER — ONDANSETRON HCL 8 MG PO TABS: 8.0000 mg | ORAL_TABLET | Freq: Three times a day (TID) | ORAL | 1 refills | Status: DC | PRN

## 2023-12-01 MED ORDER — LIDOCAINE-PRILOCAINE 2.5-2.5 % EX CREA: TOPICAL_CREAM | CUTANEOUS | 3 refills | Status: DC

## 2023-12-01 MED ORDER — CYANOCOBALAMIN 1000 MCG/ML IJ SOLN
1000.0000 ug | Freq: Once | INTRAMUSCULAR | Status: AC
Start: 2023-12-01 — End: 2023-12-01
  Administered 2023-12-01: 1000 ug via INTRAMUSCULAR
  Filled 2023-12-01: qty 1

## 2023-12-01 NOTE — Progress Notes (Signed)
START ON PATHWAY REGIMEN - Non-Small Cell Lung     A cycle is every 21 days:     Pembrolizumab      Pemetrexed      Carboplatin   **Always confirm dose/schedule in your pharmacy ordering system**  Patient Characteristics: Stage IV Metastatic, Nonsquamous, Molecular Analysis Completed, Molecular Alteration Present and Targeted Therapy Exhausted OR KRAS G12C+ or HER2+ Present and No Prior Chemo/Immunotherapy OR No Alteration Present, Initial Chemotherapy/Immunotherapy, PS =  0, 1, No Alteration Present, No Alteration Present, Candidate for Immunotherapy, PD-L1 Expression Positive  ? 50% (TPS) and Immunotherapy Candidate Therapeutic Status: Stage IV Metastatic Histology: Nonsquamous Cell Broad Molecular Profiling Status: Animal nutritionist Analysis Results: No Alteration Present ECOG Performance Status: 1 Chemotherapy/Immunotherapy Line of Therapy: Initial Chemotherapy/Immunotherapy EGFR Exons 18-21 Mutation Testing Status: Completed and Negative ALK Fusion/Rearrangement Testing Status: Completed and Negative BRAF V600 Mutation Testing Status: Completed and Negative KRAS G12C Mutation Testing Status: Completed and Negative MET Exon 14 Mutation Testing Status: Completed and Negative RET Fusion/Rearrangement Testing Status: Completed and Negative HER2 Mutation Testing Status: Completed and Negative NTRK Fusion/Rearrangement Testing Status: Completed and Negative ROS1 Fusion/Rearrangement Testing Status: Completed and Negative Immunotherapy Candidate Status: Candidate for Immunotherapy PD-L1 Expression Status: PD-L1 Positive ? 50% (TPS) Intent of Therapy: Non-Curative / Palliative Intent, Discussed with Patient

## 2023-12-01 NOTE — Progress Notes (Signed)
[name] [phone]   [phone]  OFFICE PROGRESS NOTE  Tami Rakers, MD [address] [place] [address]  DIAGNOSIS: stage IV (T4, N2, M1 B) non-small cell lung cancer, adenocarcinoma presented with large right infrahilar mass in addition to right hilar and mediastinal lymphadenopathy as well as left adrenal gland metastasis diagnosed in December 2024.   Biomarker Findings Tumor Mutational Burden - 24 Muts/Mb HRD signature - HRDsig Negative Microsatellite status - MS-Stable Genomic Findings For a complete list of the genes assayed, please refer to the Appendix. BRAF G464V KRAS G13C ASXL1 deletion exon 12 TP53 splice site 920-2A>T 6 Disease relevant genes with no reportable alterations: ALK, EGFR, ERBB2, MET, RET, ROS1  PDL1 Expression: 90%  PRIOR THERAPY: None  CURRENT THERAPY: Systemic treatment with combination of chemoimmunotherapy with carboplatin for AUC of 5, Alimta 500 Mg/M2 and Keytruda 200 Mg IV every 3 weeks.  First dose December 08, 2023.  INTERVAL HISTORY: Tami Shea 78 y.o. female returns to the clinic today for follow-up visit accompanied by her daughter and son.Discussed the use of AI scribe software for clinical note transcription with the patient, who gave verbal consent to proceed.  History of Present Illness   The patient, a 78 year old individual diagnosed with stage four non-small cell lung adenocarcinoma in December, presents for a follow-up consultation. The patient's cancer was found to have a high PD-L1 expression of 90%, making her a suitable candidate for immunotherapy. The patient has been considering treatment options since the last consultation, which included the possibility of no treatment, immunotherapy alone, or a combination of chemotherapy and immunotherapy.  The patient has expressed concerns about the potential side effects of chemotherapy, including hair loss, nausea, vomiting, and  potential damage to the kidneys and liver. She has also inquired about the frequency and duration of chemotherapy sessions, which were explained to be every three weeks, with the first four sessions lasting around three hours due to the administration of three drugs.  The patient has also been dealing with cataract issues in one eye, which she is scheduled to see an eye surgeon for next month. She has expressed a desire to improve her vision but understands the priority of treating her cancer.  She understands that this is not a contract and that she can stop the treatment at any time if she finds the side effects too severe.       MEDICAL HISTORY: Past Medical History:  Diagnosis Date   Dyspnea    History of kidney stones    HTN (hypertension)    Smoker     ALLERGIES:  has no known allergies.  MEDICATIONS:  Current Outpatient Medications  Medication Sig Dispense Refill   amLODipine (NORVASC) 10 MG tablet 1 tablet     Cholecalciferol (VITAMIN D3) 1.25 MG (50000 UT) CAPS Take 1 capsule by mouth once a week.     methylPREDNISolone (MEDROL DOSEPAK) 4 MG TBPK tablet Use as instructed 21 tablet 0   No current facility-administered medications for this visit.    SURGICAL HISTORY:  Past Surgical History:  Procedure Laterality Date   BRONCHIAL NEEDLE ASPIRATION BIOPSY  10/24/2023   Procedure: BRONCHIAL NEEDLE ASPIRATION BIOPSIES;  Surgeon: Josephine Igo, DO;  Location: [facility];  Service: Pulmonary;;   COLONOSCOPY     PILONIDAL CYST EXCISION     tonisllectomy     VIDEO BRONCHOSCOPY WITH ENDOBRONCHIAL ULTRASOUND Right 10/24/2023   Procedure: VIDEO BRONCHOSCOPY WITH ENDOBRONCHIAL ULTRASOUND;  Surgeon: Josephine Igo, DO;  Location: [facility];  Service: Pulmonary;  Laterality: Right;    REVIEW OF SYSTEMS:  Constitutional: positive for fatigue and weight loss Eyes: negative Ears, nose, mouth, throat, and face: negative Respiratory: positive for cough and dyspnea on  exertion Cardiovascular: negative Gastrointestinal: negative Genitourinary:negative Integument/breast: negative Hematologic/lymphatic: negative Musculoskeletal:negative Neurological: negative Behavioral/Psych: negative Endocrine: negative Allergic/Immunologic: negative   PHYSICAL EXAMINATION: General appearance: alert, cooperative, fatigued, and no distress Head: Normocephalic, without obvious abnormality, atraumatic Neck: no adenopathy, no JVD, supple, symmetrical, trachea midline, and thyroid not enlarged, symmetric, no tenderness/mass/nodules Lymph nodes: Cervical, supraclavicular, and axillary nodes normal. Resp: clear to auscultation bilaterally Back: symmetric, no curvature. ROM normal. No CVA tenderness. Cardio: regular rate and rhythm, S1, S2 normal, no murmur, click, rub or gallop GI: soft, non-tender; bowel sounds normal; no masses,  no organomegaly Extremities: extremities normal, atraumatic, no cyanosis or edema Neurologic: Alert and oriented X 3, normal strength and tone. Normal symmetric reflexes. Normal coordination and gait  ECOG PERFORMANCE STATUS: 1 - Symptomatic but completely ambulatory  Blood pressure 131/83, pulse 70, temperature 98 F (36.7 C), temperature source Temporal, resp. rate 16, height 5\' 4"  (1.626 m), weight 147 lb (66.7 kg), SpO2 98%.  LABORATORY DATA: Lab Results  Component Value Date   WBC 8.3 12/01/2023   HGB 11.3 (L) 12/01/2023   HCT 37.1 12/01/2023   MCV 80.8 12/01/2023   PLT 206 12/01/2023      Chemistry      Component Value Date/Time   NA 140 11/15/2023 1323   NA 142 10/12/2021 1043   K 3.6 11/15/2023 1323   CL 102 11/15/2023 1323   CO2 30 11/15/2023 1323   BUN 16 11/15/2023 1323   BUN 17 10/12/2021 1043   CREATININE 1.28 (H) 11/15/2023 1323      Component Value Date/Time   CALCIUM 9.4 11/15/2023 1323   ALKPHOS 50 11/15/2023 1323   AST 14 (L) 11/15/2023 1323   ALT 6 11/15/2023 1323   BILITOT 0.5 11/15/2023 1323        RADIOGRAPHIC STUDIES: MR BRAIN WO CONTRAST Result Date: 11/07/2023 CLINICAL DATA:  Non-small cell lung carcinoma staging EXAM: MRI HEAD WITHOUT CONTRAST TECHNIQUE: Multiplanar, multiecho pulse sequences of the brain and surrounding structures were obtained without intravenous contrast. COMPARISON:  None Available. FINDINGS: The examination was terminated by the patient prior to completion. 4 sequences were performed. Brain: There is a small, linear focus of abnormal diffusion restriction at the anterior aspect of the left lentiform nucleus. There is a second, punctate focus of diffusion restriction along the left middle frontal gyrus. No hemorrhage or extra-axial collection. There is multifocal hyperintense T2-weighted signal within the periventricular and deep white matter. Major midline structures are normal. Vascular: Normal flow voids Skull and upper cervical spine: Negative Sinuses/Orbits: Mild maxillary sinus mucosal thickening. Normal orbits. Other: None IMPRESSION: 1. The examination was terminated by the patient prior to completion. No IV contrast agent was administered. 2. Two small foci of diffusion restriction in the right hemisphere, likely acute/early subacute infarcts. These results will be called to the ordering clinician or representative by the Radiologist Assistant, and communication documented in the PACS or [employer]. Electronically Signed   By: Deatra Robinson M.D.   On: 11/07/2023 20:07    ASSESSMENT AND PLAN: This is a very pleasant 78 years old African-American female with  stage IV (T4, N2, M1 B) non-small cell lung cancer, adenocarcinoma presented with large right infrahilar mass in addition to right hilar  and mediastinal lymphadenopathy as well as left adrenal gland metastasis diagnosed in December 2024.  She had molecular studies by foundation 1 that showed no actionable mutations and PD-L1 TPS of 90%. I had a lengthy discussion with the patient and her family about  her current disease stage, prognosis and treatment options. I explained to the patient that she has incurable condition and all the treatment will be of palliative nature.  I gave the patient the option of palliative care and hospice referral versus consideration of palliative systemic therapy with either immunotherapy or single agent with Libtayo (Cempilimab) 350 Mg IV every 3 weeks for up to 2 years if she has no significant adverse effect or disease progression versus a combination of chemoimmunotherapy with carboplatin for AUC of 5, Alimta 500 Mg/M2 and Keytruda 200 Mg IV every 3 weeks for 4 cycles followed by maintenance treatment with Alimta and Keytruda. After a lengthy discussion today the patient would like to proceed with the combined chemoimmunotherapy.    Stage IV Non-Small Cell Lung Cancer (NSCLC) - Adenocarcinoma Diagnosed December 2024. High PD-L1 expression (90%). Tumor in right lung and left adrenal gland above the kidney. No actionable molecular mutations. MRI: no brain metastasis, old/acute strokes without symptoms. Discussed treatment options: hospice, immunotherapy (Libtayo), combination chemotherapy (Carboplatin, Alimta) and immunotherapy (Keytruda). Patient prefers combination therapy. Emphasized prompt treatment due to aggressive cancer. Combination therapy may improve prognosis via heightened immune response. Treatment every three weeks: first four sessions ~3 hours, maintenance ~1.5 hours. Port placement recommended. - Administer Carboplatin, Alimta, and Keytruda every three weeks - Monitor blood counts, kidney, and liver function - Administer B12 injection before treatment - Prescribe daily folic acid - Prescribe anti-nausea medication - Schedule port placement - Plan maintenance therapy with Alimta and Keytruda after four sessions - Schedule follow-up scans to monitor response  Cataract Significant cataract in one eye. Scheduled eye surgeon consult next month. Discussed  timing of surgery with chemotherapy. Advised to complete surgery before starting chemotherapy or during third week of cycle when blood counts recover. - Coordinate cataract surgery with chemotherapy schedule - Consult with eye surgeon on December 23, 2023  Old or Acute Strokes MRI revealed old/acute strokes without symptoms. No current treatment. Advised baby aspirin as preventive measure. - Take daily baby aspirin  General Health Maintenance Discussed maintaining health during cancer treatment. Advised to stay active, eat well, and avoid over-the-counter herbs that may interact with treatment. - Encourage regular exercise and healthy diet - Avoid over-the-counter herbs and supplements  Follow-up - Schedule follow-up every three weeks for chemotherapy - Monitor response with follow-up scans - Contact financial office for insurance approval and support.   The patient was advised to call immediately if she has any other concerning symptoms in the interval. The patient voices understanding of current disease status and treatment options and is in agreement with the current care plan.  All questions were answered. The patient knows to call the clinic with any problems, questions or concerns. We can certainly see the patient much sooner if necessary.  The total time spent in the appointment was 55 minutes.  Disclaimer: This note was dictated with voice recognition software. Similar sounding words can inadvertently be transcribed and may not be corrected upon review.

## 2023-12-01 NOTE — Addendum Note (Signed)
Addended by: Si Gaul on: 12/01/2023 04:41 PM   Modules accepted: Orders

## 2023-12-01 NOTE — Patient Instructions (Signed)
 Vitamin B12 Injection What is this medication? Vitamin B12 (VAHY tuh min B12) prevents and treats low vitamin B12 levels in your body. It is used in people who do not get enough vitamin B12 from their diet or when their digestive tract does not absorb enough. Vitamin B12 plays an important role in maintaining the health of your nervous system and red blood cells. This medicine may be used for other purposes; ask your health care provider or pharmacist if you have questions. COMMON BRAND NAME(S): B-12 Compliance Kit, B-12 Injection Kit, Cyomin, Dodex, LA-12, Nutri-Twelve, Physicians EZ Use B-12, Primabalt, Vitamin Deficiency Injectable System - B12 What should I tell my care team before I take this medication? They need to know if you have any of these conditions: Kidney disease Leber's disease Megaloblastic anemia An unusual or allergic reaction to cyanocobalamin, cobalt, other medications, foods, dyes, or preservatives Pregnant or trying to get pregnant Breast-feeding How should I use this medication? This medication is injected into a muscle or deeply under the skin. It is usually given in a clinic or care team's office. However, your care team may teach you how to inject yourself. Follow all instructions. Talk to your care team about the use of this medication in children. Special care may be needed. Overdosage: If you think you have taken too much of this medicine contact a poison control center or emergency room at once. NOTE: This medicine is only for you. Do not share this medicine with others. What if I miss a dose? If you are given your dose at a clinic or care team's office, call to reschedule your appointment. If you give your own injections, and you miss a dose, take it as soon as you can. If it is almost time for your next dose, take only that dose. Do not take double or extra doses. What may interact with this medication? Alcohol Colchicine This list may not describe all possible  interactions. Give your health care provider a list of all the medicines, herbs, non-prescription drugs, or dietary supplements you use. Also tell them if you smoke, drink alcohol, or use illegal drugs. Some items may interact with your medicine. What should I watch for while using this medication? Visit your care team regularly. You may need blood work done while you are taking this medication. You may need to follow a special diet. Talk to your care team. Limit your alcohol intake and avoid smoking to get the best benefit. What side effects may I notice from receiving this medication? Side effects that you should report to your care team as soon as possible: Allergic reactions--skin rash, itching, hives, swelling of the face, lips, tongue, or throat Swelling of the ankles, hands, or feet Trouble breathing Side effects that usually do not require medical attention (report to your care team if they continue or are bothersome): Diarrhea This list may not describe all possible side effects. Call your doctor for medical advice about side effects. You may report side effects to FDA at 1-800-FDA-1088. Where should I keep my medication? Keep out of the reach of children. Store at room temperature between 15 and 30 degrees C (59 and 85 degrees F). Protect from light. Throw away any unused medication after the expiration date. NOTE: This sheet is a summary. It may not cover all possible information. If you have questions about this medicine, talk to your doctor, pharmacist, or health care provider.  2024 Elsevier/Gold Standard (2021-07-07 00:00:00)

## 2023-12-02 ENCOUNTER — Telehealth: Payer: Self-pay | Admitting: Internal Medicine

## 2023-12-02 ENCOUNTER — Other Ambulatory Visit: Payer: Medicare Other

## 2023-12-03 ENCOUNTER — Other Ambulatory Visit: Payer: Self-pay

## 2023-12-05 ENCOUNTER — Other Ambulatory Visit: Payer: Self-pay

## 2023-12-05 ENCOUNTER — Inpatient Hospital Stay: Payer: Medicare Other

## 2023-12-06 MED FILL — Fosaprepitant Dimeglumine For IV Infusion 150 MG (Base Eq): INTRAVENOUS | Qty: 5 | Status: AC

## 2023-12-07 ENCOUNTER — Other Ambulatory Visit: Payer: Medicare Other

## 2023-12-07 ENCOUNTER — Inpatient Hospital Stay: Payer: Medicare Other

## 2023-12-07 VITALS — BP 137/99 | HR 71 | Temp 98.1°F | Wt 148.5 lb

## 2023-12-07 DIAGNOSIS — C3431 Malignant neoplasm of lower lobe, right bronchus or lung: Secondary | ICD-10-CM

## 2023-12-07 DIAGNOSIS — Z87891 Personal history of nicotine dependence: Secondary | ICD-10-CM | POA: Diagnosis not present

## 2023-12-07 DIAGNOSIS — C3491 Malignant neoplasm of unspecified part of right bronchus or lung: Secondary | ICD-10-CM | POA: Diagnosis not present

## 2023-12-07 DIAGNOSIS — Z5112 Encounter for antineoplastic immunotherapy: Secondary | ICD-10-CM | POA: Diagnosis not present

## 2023-12-07 DIAGNOSIS — C7972 Secondary malignant neoplasm of left adrenal gland: Secondary | ICD-10-CM | POA: Diagnosis not present

## 2023-12-07 DIAGNOSIS — Z79899 Other long term (current) drug therapy: Secondary | ICD-10-CM | POA: Diagnosis not present

## 2023-12-07 DIAGNOSIS — Z5111 Encounter for antineoplastic chemotherapy: Secondary | ICD-10-CM | POA: Diagnosis not present

## 2023-12-07 LAB — CBC WITH DIFFERENTIAL (CANCER CENTER ONLY)
Abs Immature Granulocytes: 0.03 10*3/uL (ref 0.00–0.07)
Basophils Absolute: 0 10*3/uL (ref 0.0–0.1)
Basophils Relative: 0 %
Eosinophils Absolute: 0.1 10*3/uL (ref 0.0–0.5)
Eosinophils Relative: 1 %
HCT: 37 % (ref 36.0–46.0)
Hemoglobin: 11.7 g/dL — ABNORMAL LOW (ref 12.0–15.0)
Immature Granulocytes: 0 %
Lymphocytes Relative: 23 %
Lymphs Abs: 2.1 10*3/uL (ref 0.7–4.0)
MCH: 25.1 pg — ABNORMAL LOW (ref 26.0–34.0)
MCHC: 31.6 g/dL (ref 30.0–36.0)
MCV: 79.4 fL — ABNORMAL LOW (ref 80.0–100.0)
Monocytes Absolute: 0.7 10*3/uL (ref 0.1–1.0)
Monocytes Relative: 8 %
Neutro Abs: 5.9 10*3/uL (ref 1.7–7.7)
Neutrophils Relative %: 68 %
Platelet Count: 190 10*3/uL (ref 150–400)
RBC: 4.66 MIL/uL (ref 3.87–5.11)
RDW: 17.2 % — ABNORMAL HIGH (ref 11.5–15.5)
WBC Count: 8.8 10*3/uL (ref 4.0–10.5)
nRBC: 0 % (ref 0.0–0.2)

## 2023-12-07 LAB — CMP (CANCER CENTER ONLY)
ALT: 5 U/L (ref 0–44)
AST: 14 U/L — ABNORMAL LOW (ref 15–41)
Albumin: 3.5 g/dL (ref 3.5–5.0)
Alkaline Phosphatase: 48 U/L (ref 38–126)
Anion gap: 9 (ref 5–15)
BUN: 22 mg/dL (ref 8–23)
CO2: 26 mmol/L (ref 22–32)
Calcium: 9.6 mg/dL (ref 8.9–10.3)
Chloride: 104 mmol/L (ref 98–111)
Creatinine: 1.47 mg/dL — ABNORMAL HIGH (ref 0.44–1.00)
GFR, Estimated: 37 mL/min — ABNORMAL LOW (ref >60)
Glucose, Bld: 84 mg/dL (ref 70–99)
Potassium: 4 mmol/L (ref 3.5–5.1)
Sodium: 139 mmol/L (ref 135–145)
Total Bilirubin: 0.5 mg/dL (ref 0.0–1.2)
Total Protein: 8.7 g/dL — ABNORMAL HIGH (ref 6.5–8.1)

## 2023-12-07 LAB — TSH: TSH: 0.952 u[IU]/mL (ref 0.350–4.500)

## 2023-12-07 MED ORDER — PALONOSETRON HCL INJECTION 0.25 MG/5ML
0.2500 mg | Freq: Once | INTRAVENOUS | Status: AC
Start: 2023-12-07 — End: 2023-12-07
  Administered 2023-12-07: 0.25 mg via INTRAVENOUS
  Filled 2023-12-07: qty 5

## 2023-12-07 MED ORDER — DEXAMETHASONE SODIUM PHOSPHATE 10 MG/ML IJ SOLN
10.0000 mg | Freq: Once | INTRAMUSCULAR | Status: AC
  Administered 2023-12-07: 10 mg via INTRAVENOUS
  Filled 2023-12-07: qty 1

## 2023-12-07 MED ORDER — SODIUM CHLORIDE 0.9 % IV SOLN
200.0000 mg | Freq: Once | INTRAVENOUS | Status: AC
  Administered 2023-12-07: 200 mg via INTRAVENOUS
  Filled 2023-12-07: qty 200

## 2023-12-07 MED ORDER — SODIUM CHLORIDE 0.9 % IV SOLN
150.0000 mg | Freq: Once | INTRAVENOUS | Status: AC
  Administered 2023-12-07: 150 mg via INTRAVENOUS
  Filled 2023-12-07: qty 150

## 2023-12-07 MED ORDER — SODIUM CHLORIDE 0.9 % IV SOLN
500.0000 mg/m2 | Freq: Once | INTRAVENOUS | Status: AC
  Administered 2023-12-07: 900 mg via INTRAVENOUS
  Filled 2023-12-07: qty 20

## 2023-12-07 MED ORDER — SODIUM CHLORIDE 0.9 % IV SOLN
306.0000 mg | Freq: Once | INTRAVENOUS | Status: AC
  Administered 2023-12-07: 310 mg via INTRAVENOUS
  Filled 2023-12-07: qty 31

## 2023-12-07 MED ORDER — SODIUM CHLORIDE 0.9 % IV SOLN: INTRAVENOUS | Status: DC

## 2023-12-07 NOTE — Progress Notes (Signed)
I reached out to the pt who left me a VM the evening prior. She was asking about bringing plastic bags to her appointment for her belongs to put her personal items in. I told her that would be a good questions for the treatment room nurses, but to bring some just in case.  Pt stated she just got her medication today, and she was referring to the medications Dr Arbutus Ped had prescribed her last week, her folic acid and her nausea medicine. I clarified with the pt and her daughter Tami Shea that the pt had not started her folic acid. I told Tami Shea and the pt I would need to speak to Dr Arbutus Ped to make sure she could still get her treatment today without having taken the folic acid.   I called pt and Tami Shea at 9:51 to let them know it was fine for the pt to still get treatment, but that she needs to start taking 1 tab of folic acid daily. Pt and Tami Shea verbalized understanding.

## 2023-12-07 NOTE — Progress Notes (Signed)
Chief Complaint: Patient was seen in consultation today for port-a-catheter placement.   Referring Physician(s): Mohamed,Mohamed  History of Present Illness: Tami Shea is a 78 y.o. female with a history of HTN, kidney stones and tobacco use. She underwent a lung cancer screening CT 09/26/23 which showed multiple suspicious lesions bilaterally with right paratracheal adenopathy. She underwent bronchoscopy with biopsies 10/24/23. Pathology was positive for non-small cell adenocarcinoma. Her oncology team is preparing her for palliative chemotherapy and she will require durable venous access. She feels well this morning without chest pain or shortness of breath.  Past Medical History:  Diagnosis Date   Dyspnea    History of kidney stones    HTN (hypertension)    Smoker     Past Surgical History:  Procedure Laterality Date   BRONCHIAL NEEDLE ASPIRATION BIOPSY  10/24/2023   Procedure: BRONCHIAL NEEDLE ASPIRATION BIOPSIES;  Surgeon: Josephine Igo, DO;  Location: [facility];  Service: Pulmonary;;   COLONOSCOPY     PILONIDAL CYST EXCISION     tonisllectomy     VIDEO BRONCHOSCOPY WITH ENDOBRONCHIAL ULTRASOUND Right 10/24/2023   Procedure: VIDEO BRONCHOSCOPY WITH ENDOBRONCHIAL ULTRASOUND;  Surgeon: Josephine Igo, DO;  Location: [facility];  Service: Pulmonary;  Laterality: Right;    Allergies: Patient has no known allergies.  Medications: Prior to Admission medications   Medication Sig Start Date End Date Taking? Authorizing Provider  amLODipine (NORVASC) 10 MG tablet 1 tablet    [provider]  Cholecalciferol (VITAMIN D3) 1.25 MG (50000 UT) CAPS Take 1 capsule by mouth once a week. 10/03/23   [provider]  folic acid (FOLVITE) 1 MG tablet Take 1 tablet (1 mg total) by mouth daily. Start 7 days before pemetrexed chemotherapy. Continue until 21 days after pemetrexed completed. 12/01/23   Si Gaul, MD  lidocaine-prilocaine (EMLA) cream Apply  to affected area once 12/01/23   Si Gaul, MD  ondansetron (ZOFRAN) 8 MG tablet Take 1 tablet (8 mg total) by mouth every 8 (eight) hours as needed for nausea or vomiting. Start on the third Bellantoni after carboplatin. 12/01/23   Si Gaul, MD  prochlorperazine (COMPAZINE) 10 MG tablet Take 1 tablet (10 mg total) by mouth every 6 (six) hours as needed for nausea or vomiting. 12/01/23   Si Gaul, MD     Family History  Problem Relation Age of Onset   Kidney disease Mother    Heart disease Father     Social History   Socioeconomic History   Marital status: Divorced    Spouse name: Not on file   Number of children: Not on file   Years of education: Not on file   Highest education level: Not on file  Occupational History   Not on file  Tobacco Use   Smoking status: Former    Current packs/Soward: 0.00    Types: Cigarettes    Quit date: 09/24/2023    Years since quitting: 0.2   Smokeless tobacco: Never  Substance and Sexual Activity   Alcohol use: Not Currently   Drug use: Not Currently    Types: Marijuana   Sexual activity: Not on file  Other Topics Concern   Not on file  Social History Narrative   Not on file   Social Drivers of Health   Financial Resource Strain: Not on file  Food Insecurity: Not on file  Transportation Needs: Not on file  Physical Activity: Not on file  Stress: Not on file  Social Connections: Not on file  Review of Systems: A 12 point ROS discussed and pertinent positives are indicated in the HPI above.  All other systems are negative.  Vital Signs: There were no vitals taken for this visit.  Physical Exam Constitutional:      Appearance: She is normal weight.  HENT:     Head: Normocephalic.     Mouth/Throat:     Mouth: Mucous membranes are moist.     Comments: MP2 Eyes:     General: No scleral icterus. Cardiovascular:     Rate and Rhythm: Normal rate and regular rhythm.  Pulmonary:     Effort: No respiratory distress.   Abdominal:     General: There is no distension.  Musculoskeletal:     Right lower leg: No edema.     Left lower leg: No edema.  Skin:    General: Skin is warm and dry.     Coloration: Skin is not jaundiced.  Neurological:     Mental Status: She is alert and oriented to person, place, and time.     Imaging: MR BRAIN WO CONTRAST Result Date: 11/07/2023 CLINICAL DATA:  Non-small cell lung carcinoma staging EXAM: MRI HEAD WITHOUT CONTRAST TECHNIQUE: Multiplanar, multiecho pulse sequences of the brain and surrounding structures were obtained without intravenous contrast. COMPARISON:  None Available. FINDINGS: The examination was terminated by the patient prior to completion. 4 sequences were performed. Brain: There is a small, linear focus of abnormal diffusion restriction at the anterior aspect of the left lentiform nucleus. There is a second, punctate focus of diffusion restriction along the left middle frontal gyrus. No hemorrhage or extra-axial collection. There is multifocal hyperintense T2-weighted signal within the periventricular and deep white matter. Major midline structures are normal. Vascular: Normal flow voids Skull and upper cervical spine: Negative Sinuses/Orbits: Mild maxillary sinus mucosal thickening. Normal orbits. Other: None IMPRESSION: 1. The examination was terminated by the patient prior to completion. No IV contrast agent was administered. 2. Two small foci of diffusion restriction in the right hemisphere, likely acute/early subacute infarcts. These results will be called to the ordering clinician or representative by the Radiologist Assistant, and communication documented in the PACS or [employer]. Electronically Signed   By: Deatra Robinson M.D.   On: 11/07/2023 20:07    Labs:  CBC: Recent Labs    11/01/23 1110 11/15/23 1323 12/01/23 1028 12/07/23 1330  WBC 8.4 8.8 8.3 8.8  HGB 11.6* 11.5* 11.3* 11.7*  HCT 36.3 36.6 37.1 37.0  PLT 218 282 206 190     COAGS: No results for input(s): "INR", "APTT" in the last 8760 hours.  BMP: Recent Labs    11/01/23 1110 11/15/23 1323 12/01/23 1028 12/07/23 1330  NA 141 140 139 139  K 3.3* 3.6 3.9 4.0  CL 104 102 101 104  CO2 29 30 31 26   GLUCOSE 102* 93 89 84  BUN 16 16 20 22   CALCIUM 8.9 9.4 9.7 9.6  CREATININE 1.43* 1.28* 1.37* 1.47*  GFRNONAA 38* 43* 40* 37*    LIVER FUNCTION TESTS: Recent Labs    11/01/23 1110 11/15/23 1323 12/01/23 1028 12/07/23 1330  BILITOT 0.6 0.5 0.6 0.5  AST 13* 14* 13* 14*  ALT 6 6 5 5   ALKPHOS 47 50 47 48  PROT 8.3* 8.7* 8.5* 8.7*  ALBUMIN 3.3* 3.4* 3.4* 3.5    TUMOR MARKERS: No results for input(s): "AFPTM", "CEA", "CA199", "CHROMGRNA" in the last 8760 hours.  Assessment and Plan:  Stage IV non-small cell lung cancer; pending  chemotherapy: Jaynee Eagles Falletta, 78 year old female, presents today for an image-guided port-a-catheter placement.   Risks and benefits of image-guided Port-a-catheter placement were discussed with the patient including, but not limited to bleeding, infection, pneumothorax, or fibrin sheath development and need for additional procedures.  All of the patient's questions were answered, patient is agreeable to proceed. She has been NPO.   Consent signed and in chart.  Marliss Coots, MD Pager: [phone]    I spent a total of  30 Minutes   in face to face in clinical consultation, greater than 50% of which was counseling/coordinating care for port-a-catheter placement.

## 2023-12-07 NOTE — Patient Instructions (Signed)
[facility] - A DEPT OF [facility]  Discharge Instructions: Thank you for choosing [facility] to provide your oncology and hematology care.   If you have a lab appointment with the Cancer Center, please go directly to the Cancer Center and check in at the registration area.   Wear comfortable clothing and clothing appropriate for easy access to any Portacath or PICC line.   We strive to give you quality time with your provider. You may need to reschedule your appointment if you arrive late (15 or more minutes).  Arriving late affects you and other patients whose appointments are after yours.  Also, if you miss three or more appointments without notifying the office, you may be dismissed from the clinic at the provider's discretion.      For prescription refill requests, have your pharmacy contact our office and allow 72 hours for refills to be completed.    Today you received the following chemotherapy and/or immunotherapy agents: Pembrolizumab, Pemetrexed, Carboplatin.       To help prevent nausea and vomiting after your treatment, we encourage you to take your nausea medication as directed.  BELOW ARE SYMPTOMS THAT SHOULD BE REPORTED IMMEDIATELY: *FEVER GREATER THAN 100.4 F (38 C) OR HIGHER *CHILLS OR SWEATING *NAUSEA AND VOMITING THAT IS NOT CONTROLLED WITH YOUR NAUSEA MEDICATION *UNUSUAL SHORTNESS OF BREATH *UNUSUAL BRUISING OR BLEEDING *URINARY PROBLEMS (pain or burning when urinating, or frequent urination) *BOWEL PROBLEMS (unusual diarrhea, constipation, pain near the anus) TENDERNESS IN MOUTH AND THROAT WITH OR WITHOUT PRESENCE OF ULCERS (sore throat, sores in mouth, or a toothache) UNUSUAL RASH, SWELLING OR PAIN  UNUSUAL VAGINAL DISCHARGE OR ITCHING   Items with * indicate a potential emergency and should be followed up as soon as possible or go to the Emergency Department if any problems should occur.  Please show the  CHEMOTHERAPY ALERT CARD or IMMUNOTHERAPY ALERT CARD at check-in to the Emergency Department and triage nurse.  Should you have questions after your visit or need to cancel or reschedule your appointment, please contact [facility] - A DEPT OF [facility]  Dept: [phone]  and follow the prompts.  Office hours are 8:00 a.m. to 4:30 p.m. Monday - Friday. Please note that voicemails left after 4:00 p.m. may not be returned until the following business [place].  We are closed weekends and major holidays. You have access to a nurse at all times for urgent questions. Please call the main number to the clinic Dept: [phone] and follow the prompts.   For any non-urgent questions, you may also contact your provider using MyChart. We now offer e-Visits for anyone 42 and older to request care online for non-urgent symptoms. For details visit [email or web address].   Also download the MyChart app! Go to the app store, search "MyChart", open the app, select [facility], and log in with your MyChart username and password.  Pembrolizumab Injection What is this medication? PEMBROLIZUMAB (PEM broe LIZ ue mab) treats some types of cancer. It works by helping your immune system slow or stop the spread of cancer cells. It is a monoclonal antibody. This medicine may be used for other purposes; ask your health care provider or pharmacist if you have questions. COMMON BRAND NAME(S): Keytruda What should I tell my care team before I take this medication? They need to know if you have any of these conditions: Allogeneic stem cell transplant (uses someone  else's stem cells) Autoimmune diseases, such as Crohn disease, ulcerative colitis, lupus History of chest radiation Nervous system problems, such as Guillain-Barre syndrome, myasthenia gravis Organ transplant An unusual or allergic reaction to pembrolizumab, other medications, foods, dyes, or preservatives Pregnant or trying to get  pregnant Breast-feeding How should I use this medication? This medication is injected into a vein. It is given by your care team in a hospital or clinic setting. A special MedGuide will be given to you before each treatment. Be sure to read this information carefully each time. Talk to your care team about the use of this medication in children. While it may be prescribed for children as young as 6 months for selected conditions, precautions do apply. Overdosage: If you think you have taken too much of this medicine contact a poison control center or emergency room at once. NOTE: This medicine is only for you. Do not share this medicine with others. What if I miss a dose? Keep appointments for follow-up doses. It is important not to miss your dose. Call your care team if you are unable to keep an appointment. What may interact with this medication? Interactions have not been studied. This list may not describe all possible interactions. Give your health care provider a list of all the medicines, herbs, non-prescription drugs, or dietary supplements you use. Also tell them if you smoke, drink alcohol, or use illegal drugs. Some items may interact with your medicine. What should I watch for while using this medication? Your condition will be monitored carefully while you are receiving this medication. You may need blood work while taking this medication. This medication may cause serious skin reactions. They can happen weeks to months after starting the medication. Contact your care team right away if you notice fevers or flu-like symptoms with a rash. The rash may be red or purple and then turn into blisters or peeling of the skin. You may also notice a red rash with swelling of the face, lips, or lymph nodes in your neck or under your arms. Tell your care team right away if you have any change in your eyesight. Talk to your care team if you may be pregnant. Serious birth defects can occur if you  take this medication during pregnancy and for 4 months after the last dose. You will need a negative pregnancy test before starting this medication. Contraception is recommended while taking this medication and for 4 months after the last dose. Your care team can help you find the option that works for you. Do not breastfeed while taking this medication and for 4 months after the last dose. What side effects may I notice from receiving this medication? Side effects that you should report to your care team as soon as possible: Allergic reactions--skin rash, itching, hives, swelling of the face, lips, tongue, or throat Dry cough, shortness of breath or trouble breathing Eye pain, redness, irritation, or discharge with blurry or decreased vision Heart muscle inflammation--unusual weakness or fatigue, shortness of breath, chest pain, fast or irregular heartbeat, dizziness, swelling of the ankles, feet, or hands Hormone gland problems--headache, sensitivity to light, unusual weakness or fatigue, dizziness, fast or irregular heartbeat, increased sensitivity to cold or heat, excessive sweating, constipation, hair loss, increased thirst or amount of urine, tremors or shaking, irritability Infusion reactions--chest pain, shortness of breath or trouble breathing, feeling faint or lightheaded Kidney injury (glomerulonephritis)--decrease in the amount of urine, red or dark brown urine, foamy or bubbly urine, swelling of the ankles,  hands, or feet Liver injury--right upper belly pain, loss of appetite, nausea, light-colored stool, dark yellow or brown urine, yellowing skin or eyes, unusual weakness or fatigue Pain, tingling, or numbness in the hands or feet, muscle weakness, change in vision, confusion or trouble speaking, loss of balance or coordination, trouble walking, seizures Rash, fever, and swollen lymph nodes Redness, blistering, peeling, or loosening of the skin, including inside the mouth Sudden or  severe stomach pain, bloody diarrhea, fever, nausea, vomiting Side effects that usually do not require medical attention (report to your care team if they continue or are bothersome): Bone, joint, or muscle pain Diarrhea Fatigue Loss of appetite Nausea Skin rash This list may not describe all possible side effects. Call your doctor for medical advice about side effects. You may report side effects to FDA at 1-800-FDA-1088. Where should I keep my medication? This medication is given in a hospital or clinic. It will not be stored at home. NOTE: This sheet is a summary. It may not cover all possible information. If you have questions about this medicine, talk to your doctor, pharmacist, or health care provider.  2024 Elsevier/Gold Standard (2022-03-09 00:00:00) Pemetrexed Injection What is this medication? PEMETREXED (PEM e TREX ed) treats some types of cancer. It works by slowing down the growth of cancer cells. This medicine may be used for other purposes; ask your health care provider or pharmacist if you have questions. COMMON BRAND NAME(S): Alimta, PEMFEXY, PEMRYDI RTU What should I tell my care team before I take this medication? They need to know if you have any of these conditions: Infection, such as chickenpox, cold sores, or herpes Kidney disease Low blood cell levels (white cells, red cells, and platelets) Lung or breathing disease, such as asthma Radiation therapy An unusual or allergic reaction to pemetrexed, other medications, foods, dyes, or preservatives If you or your partner are pregnant or trying to get pregnant Breast-feeding How should I use this medication? This medication is injected into a vein. It is given by your care team in a hospital or clinic setting. Talk to your care team about the use of this medication in children. Special care may be needed. Overdosage: If you think you have taken too much of this medicine contact a poison control center or emergency  room at once. NOTE: This medicine is only for you. Do not share this medicine with others. What if I miss a dose? Keep appointments for follow-up doses. It is important not to miss your dose. Call your care team if you are unable to keep an appointment. What may interact with this medication? Do not take this medication with any of the following: Live virus vaccines This medication may also interact with the following: Ibuprofen This list may not describe all possible interactions. Give your health care provider a list of all the medicines, herbs, non-prescription drugs, or dietary supplements you use. Also tell them if you smoke, drink alcohol, or use illegal drugs. Some items may interact with your medicine. What should I watch for while using this medication? Your condition will be monitored carefully while you are receiving this medication. This medication may make you feel generally unwell. This is not uncommon as chemotherapy can affect healthy cells as well as cancer cells. Report any side effects. Continue your course of treatment even though you feel ill unless your care team tells you to stop. This medication can cause serious side effects. To reduce the risk, your care team may give you other medications  to take before receiving this one. Be sure to follow the directions from your care team. This medication can cause a rash or redness in areas of the body that have previously had radiation therapy. If you have had radiation therapy, tell your care team if you notice a rash in this area. This medication may increase your risk of getting an infection. Call your care team for advice if you get a fever, chills, sore throat, or other symptoms of a cold or flu. Do not treat yourself. Try to avoid being around people who are sick. Be careful brushing or flossing your teeth or using a toothpick because you may get an infection or bleed more easily. If you have any dental work done, tell your  dentist you are receiving this medication. Avoid taking medications that contain aspirin, acetaminophen, ibuprofen, naproxen, or ketoprofen unless instructed by your care team. These medications may hide a fever. Check with your care team if you have severe diarrhea, nausea, and vomiting, or if you sweat a lot. The loss of too much body fluid may make it dangerous for you to take this medication. Talk to your care team if you or your partner wish to become pregnant or think either of you might be pregnant. This medication can cause serious birth defects if taken during pregnancy and for 6 months after the last dose. A negative pregnancy test is required before starting this medication. A reliable form of contraception is recommended while taking this medication and for 6 months after the last dose. Talk to your care team about reliable forms of contraception. Do not father a child while taking this medication and for 3 months after the last dose. Use a condom while having sex during this time period. Do not breastfeed while taking this medication and for 1 week after the last dose. This medication may cause infertility. Talk to your care team if you are concerned about your fertility. What side effects may I notice from receiving this medication? Side effects that you should report to your care team as soon as possible: Allergic reactions--skin rash, itching, hives, swelling of the face, lips, tongue, or throat Dry cough, shortness of breath or trouble breathing Infection--fever, chills, cough, sore throat, wounds that don't heal, pain or trouble when passing urine, general feeling of discomfort or being unwell Kidney injury--decrease in the amount of urine, swelling of the ankles, hands, or feet Low red blood cell level--unusual weakness or fatigue, dizziness, headache, trouble breathing Redness, blistering, peeling, or loosening of the skin, including inside the mouth Unusual bruising or  bleeding Side effects that usually do not require medical attention (report to your care team if they continue or are bothersome): Fatigue Loss of appetite Nausea Vomiting This list may not describe all possible side effects. Call your doctor for medical advice about side effects. You may report side effects to FDA at 1-800-FDA-1088. Where should I keep my medication? This medication is given in a hospital or clinic. It will not be stored at home. NOTE: This sheet is a summary. It may not cover all possible information. If you have questions about this medicine, talk to your doctor, pharmacist, or health care provider.  2024 Elsevier/Gold Standard (2022-03-02 00:00:00) Carboplatin Injection What is this medication? CARBOPLATIN (KAR boe pla tin) treats some types of cancer. It works by slowing down the growth of cancer cells. This medicine may be used for other purposes; ask your health care provider or pharmacist if you have questions. COMMON BRAND  NAME(S): Paraplatin What should I tell my care team before I take this medication? They need to know if you have any of these conditions: Blood disorders Hearing problems Kidney disease Recent or ongoing radiation therapy An unusual or allergic reaction to carboplatin, cisplatin, other medications, foods, dyes, or preservatives Pregnant or trying to get pregnant Breast-feeding How should I use this medication? This medication is injected into a vein. It is given by your care team in a hospital or clinic setting. Talk to your care team about the use of this medication in children. Special care may be needed. Overdosage: If you think you have taken too much of this medicine contact a poison control center or emergency room at once. NOTE: This medicine is only for you. Do not share this medicine with others. What if I miss a dose? Keep appointments for follow-up doses. It is important not to miss your dose. Call your care team if you are  unable to keep an appointment. What may interact with this medication? Medications for seizures Some antibiotics, such as amikacin, gentamicin, neomycin, streptomycin, tobramycin Vaccines This list may not describe all possible interactions. Give your health care provider a list of all the medicines, herbs, non-prescription drugs, or dietary supplements you use. Also tell them if you smoke, drink alcohol, or use illegal drugs. Some items may interact with your medicine. What should I watch for while using this medication? Your condition will be monitored carefully while you are receiving this medication. You may need blood work while taking this medication. This medication may make you feel generally unwell. This is not uncommon, as chemotherapy can affect healthy cells as well as cancer cells. Report any side effects. Continue your course of treatment even though you feel ill unless your care team tells you to stop. In some cases, you may be given additional medications to help with side effects. Follow all directions for their use. This medication may increase your risk of getting an infection. Call your care team for advice if you get a fever, chills, sore throat, or other symptoms of a cold or flu. Do not treat yourself. Try to avoid being around people who are sick. Avoid taking medications that contain aspirin, acetaminophen, ibuprofen, naproxen, or ketoprofen unless instructed by your care team. These medications may hide a fever. Be careful brushing or flossing your teeth or using a toothpick because you may get an infection or bleed more easily. If you have any dental work done, tell your dentist you are receiving this medication. Talk to your care team if you wish to become pregnant or think you might be pregnant. This medication can cause serious birth defects. Talk to your care team about effective forms of contraception. Do not breast-feed while taking this medication. What side effects  may I notice from receiving this medication? Side effects that you should report to your care team as soon as possible: Allergic reactions--skin rash, itching, hives, swelling of the face, lips, tongue, or throat Infection--fever, chills, cough, sore throat, wounds that don't heal, pain or trouble when passing urine, general feeling of discomfort or being unwell Low red blood cell level--unusual weakness or fatigue, dizziness, headache, trouble breathing Pain, tingling, or numbness in the hands or feet, muscle weakness, change in vision, confusion or trouble speaking, loss of balance or coordination, trouble walking, seizures Unusual bruising or bleeding Side effects that usually do not require medical attention (report to your care team if they continue or are bothersome): Hair loss Nausea Unusual  weakness or fatigue Vomiting This list may not describe all possible side effects. Call your doctor for medical advice about side effects. You may report side effects to FDA at 1-800-FDA-1088. Where should I keep my medication? This medication is given in a hospital or clinic. It will not be stored at home. NOTE: This sheet is a summary. It may not cover all possible information. If you have questions about this medicine, talk to your doctor, pharmacist, or health care provider.  2024 Elsevier/Gold Standard (2022-02-16 00:00:00)

## 2023-12-07 NOTE — Progress Notes (Signed)
OK to treat with elevated BP per Dr Arbutus Ped

## 2023-12-08 LAB — T4: T4, Total: 7.5 ug/dL (ref 4.5–12.0)

## 2023-12-08 NOTE — Progress Notes (Signed)
Phone call to patient to discuss Port insertion on 12/09/23. Reviewed patients allergies and medications with her and her daughter Christella Hartigan. Instructions provided and what to expect during the procedure, also reminded both that she will need a driver for the procedure. Time allowed to for questions, both patient and daughter verbalized understanding.

## 2023-12-09 ENCOUNTER — Ambulatory Visit
Admission: RE | Admit: 2023-12-09 | Discharge: 2023-12-09 | Disposition: A | Discharge status: Home / Self Care | Payer: Medicare Other | Source: Ambulatory Visit | Attending: Internal Medicine | Admitting: Internal Medicine

## 2023-12-09 DIAGNOSIS — Z452 Encounter for adjustment and management of vascular access device: Secondary | ICD-10-CM | POA: Diagnosis not present

## 2023-12-09 DIAGNOSIS — C3490 Malignant neoplasm of unspecified part of unspecified bronchus or lung: Secondary | ICD-10-CM | POA: Diagnosis not present

## 2023-12-09 DIAGNOSIS — C3431 Malignant neoplasm of lower lobe, right bronchus or lung: Secondary | ICD-10-CM

## 2023-12-09 HISTORY — PX: IR IMAGING GUIDED PORT INSERTION: IMG5740

## 2023-12-09 MED ORDER — MIDAZOLAM HCL 2 MG/2ML IJ SOLN: 1.0000 mg | INTRAMUSCULAR | Status: DC | PRN

## 2023-12-09 MED ORDER — FENTANYL CITRATE (PF) 100 MCG/2ML IJ SOLN
INTRAMUSCULAR | Status: AC | PRN
  Administered 2023-12-09: 25 ug via INTRAVENOUS

## 2023-12-09 MED ORDER — FENTANYL CITRATE PF 50 MCG/ML IJ SOSY: 25.0000 ug | PREFILLED_SYRINGE | INTRAMUSCULAR | Status: DC | PRN

## 2023-12-09 MED ORDER — MIDAZOLAM HCL 2 MG/2ML IJ SOLN
INTRAMUSCULAR | Status: AC | PRN
  Administered 2023-12-09: 1 mg via INTRAVENOUS

## 2023-12-09 MED ORDER — SODIUM CHLORIDE 0.9 % IV SOLN: INTRAVENOUS | Status: AC

## 2023-12-09 NOTE — Procedures (Signed)
 Interventional Radiology Procedure Note  Procedure: Single Lumen Power Port Placement    Access:  Right internal jugular vein  Findings: Catheter tip positioned at cavoatrial junction. Port is ready for immediate use.   Complications: None  EBL: < 10 mL  Recommendations:  - Ok to shower in 24 hours - Do not submerge for 7 days - Routine line care    Marliss Coots, MD

## 2023-12-09 NOTE — Progress Notes (Signed)
Pt back in nursing recovery area. Pt still drowsy from procedure but will wake up when spoken to. Pt follows commands, talks in complete sentences and has no complaints at this time. Pt will remain in nursing station until discharge.  ?

## 2023-12-14 ENCOUNTER — Other Ambulatory Visit: Payer: Self-pay | Admitting: *Deleted

## 2023-12-14 DIAGNOSIS — C3431 Malignant neoplasm of lower lobe, right bronchus or lung: Secondary | ICD-10-CM

## 2023-12-15 ENCOUNTER — Inpatient Hospital Stay: Payer: Medicare Other | Admitting: Internal Medicine

## 2023-12-15 ENCOUNTER — Telehealth: Payer: Self-pay | Admitting: Internal Medicine

## 2023-12-15 ENCOUNTER — Inpatient Hospital Stay: Payer: Medicare Other | Attending: Internal Medicine

## 2023-12-15 VITALS — BP 142/91 | HR 91 | Temp 98.4°F | Resp 16 | Ht 64.0 in | Wt 152.5 lb

## 2023-12-15 DIAGNOSIS — Z79899 Other long term (current) drug therapy: Secondary | ICD-10-CM | POA: Insufficient documentation

## 2023-12-15 DIAGNOSIS — Z5112 Encounter for antineoplastic immunotherapy: Secondary | ICD-10-CM | POA: Diagnosis not present

## 2023-12-15 DIAGNOSIS — Z5111 Encounter for antineoplastic chemotherapy: Secondary | ICD-10-CM | POA: Insufficient documentation

## 2023-12-15 DIAGNOSIS — D649 Anemia, unspecified: Secondary | ICD-10-CM | POA: Diagnosis not present

## 2023-12-15 DIAGNOSIS — C3431 Malignant neoplasm of lower lobe, right bronchus or lung: Secondary | ICD-10-CM

## 2023-12-15 DIAGNOSIS — R918 Other nonspecific abnormal finding of lung field: Secondary | ICD-10-CM

## 2023-12-15 DIAGNOSIS — C3491 Malignant neoplasm of unspecified part of right bronchus or lung: Secondary | ICD-10-CM | POA: Insufficient documentation

## 2023-12-15 DIAGNOSIS — C7972 Secondary malignant neoplasm of left adrenal gland: Secondary | ICD-10-CM | POA: Diagnosis not present

## 2023-12-15 LAB — CBC WITH DIFFERENTIAL (CANCER CENTER ONLY)
Abs Immature Granulocytes: 0.01 10*3/uL (ref 0.00–0.07)
Basophils Absolute: 0 10*3/uL (ref 0.0–0.1)
Basophils Relative: 0 %
Eosinophils Absolute: 0 10*3/uL (ref 0.0–0.5)
Eosinophils Relative: 1 %
HCT: 31.6 % — ABNORMAL LOW (ref 36.0–46.0)
Hemoglobin: 10 g/dL — ABNORMAL LOW (ref 12.0–15.0)
Immature Granulocytes: 0 %
Lymphocytes Relative: 27 %
Lymphs Abs: 1 10*3/uL (ref 0.7–4.0)
MCH: 24.9 pg — ABNORMAL LOW (ref 26.0–34.0)
MCHC: 31.6 g/dL (ref 30.0–36.0)
MCV: 78.8 fL — ABNORMAL LOW (ref 80.0–100.0)
Monocytes Absolute: 0.1 10*3/uL (ref 0.1–1.0)
Monocytes Relative: 3 %
Neutro Abs: 2.5 10*3/uL (ref 1.7–7.7)
Neutrophils Relative %: 69 %
Platelet Count: 122 10*3/uL — ABNORMAL LOW (ref 150–400)
RBC: 4.01 MIL/uL (ref 3.87–5.11)
RDW: 16.4 % — ABNORMAL HIGH (ref 11.5–15.5)
WBC Count: 3.6 10*3/uL — ABNORMAL LOW (ref 4.0–10.5)
nRBC: 0 % (ref 0.0–0.2)

## 2023-12-15 LAB — CMP (CANCER CENTER ONLY)
ALT: 8 U/L (ref 0–44)
AST: 14 U/L — ABNORMAL LOW (ref 15–41)
Albumin: 3.3 g/dL — ABNORMAL LOW (ref 3.5–5.0)
Alkaline Phosphatase: 46 U/L (ref 38–126)
Anion gap: 5 (ref 5–15)
BUN: 23 mg/dL (ref 8–23)
CO2: 29 mmol/L (ref 22–32)
Calcium: 8.9 mg/dL (ref 8.9–10.3)
Chloride: 103 mmol/L (ref 98–111)
Creatinine: 1.24 mg/dL — ABNORMAL HIGH (ref 0.44–1.00)
GFR, Estimated: 45 mL/min — ABNORMAL LOW (ref >60)
Glucose, Bld: 95 mg/dL (ref 70–99)
Potassium: 3.9 mmol/L (ref 3.5–5.1)
Sodium: 137 mmol/L (ref 135–145)
Total Bilirubin: 0.7 mg/dL (ref 0.0–1.2)
Total Protein: 8.1 g/dL (ref 6.5–8.1)

## 2023-12-15 MED ORDER — SODIUM CHLORIDE 0.9% FLUSH
10.0000 mL | Freq: Once | INTRAVENOUS | Status: AC
  Administered 2023-12-15: 10 mL

## 2023-12-15 MED ORDER — HEPARIN SOD (PORK) LOCK FLUSH 100 UNIT/ML IV SOLN
500.0000 [IU] | Freq: Once | INTRAVENOUS | Status: AC
Start: 2023-12-15 — End: 2023-12-15
  Administered 2023-12-15: 500 [IU]

## 2023-12-15 NOTE — Progress Notes (Signed)
 [facility] [phone]   [phone]  OFFICE PROGRESS NOTE  [name], MD [address], [address] [place] [address]  DIAGNOSIS: stage IV (T4, N2, M1 B) non-small cell lung cancer, adenocarcinoma presented with large right infrahilar mass in addition to right hilar and mediastinal lymphadenopathy as well as left adrenal gland metastasis diagnosed in December 2024.   Biomarker Findings Tumor Mutational Burden - 24 Muts/Mb HRD signature - HRDsig Negative Microsatellite status - MS-Stable Genomic Findings For a complete list of the genes assayed, please refer to the Appendix. BRAF G464V KRAS G13C ASXL1 deletion exon 12 TP53 splice site 920-2A>T 6 Disease relevant genes with no reportable alterations: ALK, EGFR, ERBB2, MET, RET, ROS1  PDL1 Expression: 90%  PRIOR THERAPY: None  CURRENT THERAPY: Systemic treatment with combination of chemoimmunotherapy with carboplatin  for AUC of 5, Alimta  500 Mg/M2 and Keytruda  200 Mg IV every 3 weeks.  First dose December 08, 2023.  Status post 1 cycle.  INTERVAL HISTORY: Tami Shea 78 y.o. female returns to the clinic today for follow-up visit accompanied by her son.Discussed the use of AI scribe software for clinical note transcription with the patient, who gave verbal consent to proceed.  History of Present Illness   Tami Shea is a 78 year old female with stage four non-small cell lung cancer adenocarcinoma who presents for follow-up after starting chemotherapy and immunotherapy. She is accompanied by her son.  Diagnosed with stage four non-small cell lung cancer adenocarcinoma in December 2024, she began her first cycle of chemotherapy and immunotherapy last week, receiving carboplatin , pemetrexed  (Alimta ), and pembrolizumab  (Keytruda ). The initial treatment was well-tolerated with supportive care from the medical team.  In the past few days, she has noticed red spots and bruising in the area where the  port was placed several days ago. The bruising started appearing today, but there is no associated pain.  She experienced nausea yesterday and took two Tylenol , which alleviated her symptoms. No fever, chills, vomiting, or diarrhea. She reports taking a probiotic a Baack before the port installation, which resulted in a good bowel movement.  Blood work was drawn today from the port, and she is awaiting results. She notes a slight weight gain since her last visit.       MEDICAL HISTORY: Past Medical History:  Diagnosis Date   Dyspnea    History of kidney stones    HTN (hypertension)    Smoker     ALLERGIES:  has no known allergies.  MEDICATIONS:  Current Outpatient Medications  Medication Sig Dispense Refill   amLODipine  (NORVASC ) 10 MG tablet 1 tablet     Cholecalciferol (VITAMIN D3) 1.25 MG (50000 UT) CAPS Take 1 capsule by mouth once a week.     folic acid  (FOLVITE ) 1 MG tablet Take 1 tablet (1 mg total) by mouth daily. Start 7 days before pemetrexed  chemotherapy. Continue until 21 days after pemetrexed  completed. 100 tablet 3   lidocaine -prilocaine  (EMLA ) cream Apply to affected area once 30 g 3   ondansetron  (ZOFRAN ) 8 MG tablet Take 1 tablet (8 mg total) by mouth every 8 (eight) hours as needed for nausea or vomiting. Start on the third Surita after carboplatin . 30 tablet 1   prochlorperazine  (COMPAZINE ) 10 MG tablet Take 1 tablet (10 mg total) by mouth every 6 (six) hours as needed for nausea or vomiting. 30 tablet 1   No current facility-administered medications for this visit.    SURGICAL HISTORY:  Past Surgical History:  Procedure Laterality Date   BRONCHIAL NEEDLE ASPIRATION BIOPSY  10/24/2023   Procedure: BRONCHIAL NEEDLE ASPIRATION BIOPSIES;  Surgeon: Brenna Adine CROME, DO;  Location: [facility];  Service: Pulmonary;;   COLONOSCOPY     IR IMAGING GUIDED PORT INSERTION  12/09/2023   PILONIDAL CYST EXCISION     tonisllectomy     VIDEO BRONCHOSCOPY WITH ENDOBRONCHIAL  ULTRASOUND Right 10/24/2023   Procedure: VIDEO BRONCHOSCOPY WITH ENDOBRONCHIAL ULTRASOUND;  Surgeon: Brenna Adine CROME, DO;  Location: [facility];  Service: Pulmonary;  Laterality: Right;    REVIEW OF SYSTEMS:  A comprehensive review of systems was negative except for: Constitutional: positive for fatigue Integument/breast: positive for rash   PHYSICAL EXAMINATION: General appearance: alert, cooperative, fatigued, and no distress Head: Normocephalic, without obvious abnormality, atraumatic Neck: no adenopathy, no JVD, supple, symmetrical, trachea midline, and thyroid  not enlarged, symmetric, no tenderness/mass/nodules Lymph nodes: Cervical, supraclavicular, and axillary nodes normal. Resp: clear to auscultation bilaterally Back: symmetric, no curvature. ROM normal. No CVA tenderness. Cardio: regular rate and rhythm, S1, S2 normal, no murmur, click, rub or gallop GI: soft, non-tender; bowel sounds normal; no masses,  no organomegaly Extremities: extremities normal, atraumatic, no cyanosis or edema  ECOG PERFORMANCE STATUS: 1 - Symptomatic but completely ambulatory  Blood pressure (!) 142/91, pulse 91, temperature 98.4 F (36.9 C), temperature source Temporal, resp. rate 16, height 5' 4 (1.626 m), weight 152 lb 8 oz (69.2 kg), SpO2 95%.  LABORATORY DATA: Lab Results  Component Value Date   WBC 8.8 12/07/2023   HGB 11.7 (L) 12/07/2023   HCT 37.0 12/07/2023   MCV 79.4 (L) 12/07/2023   PLT 190 12/07/2023      Chemistry      Component Value Date/Time   NA 139 12/07/2023 1330   NA 142 10/12/2021 1043   K 4.0 12/07/2023 1330   CL 104 12/07/2023 1330   CO2 26 12/07/2023 1330   BUN 22 12/07/2023 1330   BUN 17 10/12/2021 1043   CREATININE 1.47 (H) 12/07/2023 1330      Component Value Date/Time   CALCIUM 9.6 12/07/2023 1330   ALKPHOS 48 12/07/2023 1330   AST 14 (L) 12/07/2023 1330   ALT 5 12/07/2023 1330   BILITOT 0.5 12/07/2023 1330       RADIOGRAPHIC STUDIES: IR IMAGING  GUIDED PORT INSERTION Result Date: 12/09/2023 INDICATION: 78 year old female with advanced stage lung cancer requiring central venous access for chemotherapy administration. EXAM: IMPLANTED PORT A CATH PLACEMENT WITH ULTRASOUND AND FLUOROSCOPIC GUIDANCE COMPARISON:  None Available. MEDICATIONS: None. ANESTHESIA/SEDATION: Moderate (conscious) sedation was employed during this procedure. A total of Versed  1 mg and Fentanyl  25 mcg was administered intravenously. Moderate Sedation Time: 12 minutes. The patient's level of consciousness and vital signs were monitored continuously by radiology nursing throughout the procedure under my direct supervision. CONTRAST:  None FLUOROSCOPY TIME:  2.3 mGy COMPLICATIONS: None immediate. PROCEDURE: The procedure, risks, benefits, and alternatives were explained to the patient. Questions regarding the procedure were encouraged and answered. The patient understands and consents to the procedure. The right neck and chest were prepped with chlorhexidine  in a sterile fashion, and a sterile drape was applied covering the operative field. Maximum barrier sterile technique with sterile gowns and gloves were used for the procedure. A timeout was performed prior to the initiation of the procedure. Ultrasound was used to examine the jugular vein which was compressible and free of internal echoes. A skin marker was used to demarcate the planned venotomy and port  pocket incision sites. Local anesthesia was provided to these sites and the subcutaneous tunnel track with 1% lidocaine  with 1:100,000 epinephrine. A small incision was created at the jugular access site and blunt dissection was performed of the subcutaneous tissues. Under ultrasound guidance, the jugular vein was accessed with a 21 ga micropuncture needle and an 0.018 wire was inserted to the superior vena cava. Real-time ultrasound guidance was utilized for vascular access including the acquisition of a permanent ultrasound image  documenting patency of the accessed vessel. A 5 Fr micopuncture set was then used, through which a 0.035 Rosen wire was passed under fluoroscopic guidance into the inferior vena cava. An 8 Fr dilator was then placed over the wire. A subcutaneous port pocket was then created along the upper chest wall utilizing a combination of sharp and blunt dissection. The pocket was irrigated with sterile saline, packed with gauze, and observed for hemorrhage. A single lumen plastic power injectable port was chosen for placement. The 8 Fr catheter was tunneled from the port pocket site to the venotomy incision. The port was placed in the pocket. The external catheter was trimmed to appropriate length. The dilator was exchanged for an 8 Fr peel-away sheath under fluoroscopic guidance. The catheter was then placed through the sheath and the sheath was removed. Final catheter positioning was confirmed and documented with a fluoroscopic spot radiograph. The port was accessed with a Huber needle, aspirated, and flushed with heparinized saline. The deep dermal layer of the port pocket incision was closed with interrupted 3-0 Vicryl suture. Dermabond was then placed over the port pocket and neck incisions. The patient tolerated the procedure well without immediate post procedural complication. FINDINGS: After catheter placement, the tip lies within the superior cavoatrial junction. The catheter aspirates and flushes normally and is ready for immediate use. IMPRESSION: Successful placement of a power injectable Port-A-Cath via the right internal jugular vein. The catheter is ready for immediate use. Ester Sides, MD [facility] Electronically Signed   By: Ester Sides M.D.   On: 12/09/2023 10:09    ASSESSMENT AND PLAN: This is a very pleasant 78 years old African-American female with  stage IV (T4, N2, M1 B) non-small cell lung cancer, adenocarcinoma presented with large  right infrahilar mass in addition to right hilar and mediastinal lymphadenopathy as well as left adrenal gland metastasis diagnosed in December 2024.  She had molecular studies by [facility] that showed no actionable mutations and PD-L1 TPS of 90%. She is currently undergoing treatment with chemoimmunotherapy with carboplatin  for AUC of 5, Alimta  500 Mg/M2 and Keytruda  200 Mg IV every 3 weeks for 4 cycles followed by maintenance treatment with Alimta  and Keytruda .  Status post 1 cycle.  She tolerated the first week of her treatment fairly well.     Stage IV Non-Small Cell Lung Cancer (Adenocarcinoma) Diagnosed December 2024. Undergoing chemotherapy and immunotherapy with carboplatin , Alimta , and Keytruda . Completed first cycle last week with good tolerance; no significant side effects except bruising at port site. No fever, chills, nausea, vomiting, or diarrhea. Vitals stable; slight increase in blood pressure and weight gain of three pounds. Blood work done today, results pending. Discussed Emla  cream for port site numbing. - Continue chemotherapy and immunotherapy with carboplatin , Alimta , and Keytruda  - Apply hydrocortisone cream to bruising at port site - Use Emla  cream 30 minutes before treatment - Review blood work results and call if concerning - Schedule next chemotherapy cycle in two weeks  General Health  Maintenance Overall health well-managed. Oxygen saturation at 95%. - Monitor blood pressure - Continue current medications and follow-up as scheduled  Follow-Up - Follow-up in two weeks for next chemotherapy cycle.   The patient was advised to call immediately if she has any other concerning symptoms in the interval. The patient voices understanding of current disease status and treatment options and is in agreement with the current care plan.  All questions were answered. The patient knows to call the clinic with any problems, questions or concerns. We can certainly see the  patient much sooner if necessary.  The total time spent in the appointment was 20 minutes.  Disclaimer: This note was dictated with voice recognition software. Similar sounding words can inadvertently be transcribed and may not be corrected upon review.

## 2023-12-15 NOTE — Telephone Encounter (Signed)
 Patient was already scheduled for upcoming appointments. Cancelled appointment as it is not needed. Left patient a detailed message to call to reschedule if needed.

## 2023-12-16 NOTE — Progress Notes (Incomplete)
Initial note documented in error

## 2023-12-17 ENCOUNTER — Other Ambulatory Visit: Payer: Self-pay

## 2023-12-23 NOTE — Progress Notes (Signed)
 [facility] OFFICE PROGRESS NOTE  Tami Rakers, MD [address], [address] [place] [address]  DIAGNOSIS: stage IV (T4, N2, M1 B) non-small cell lung cancer, adenocarcinoma presented with large right infrahilar mass in addition to right hilar and mediastinal lymphadenopathy as well as left adrenal gland metastasis diagnosed in December 2024.    Biomarker Findings Tumor Mutational Burden - 24 Muts/Mb HRD signature - HRDsig Negative Microsatellite status - MS-Stable Genomic Findings For a complete list of the genes assayed, please refer to the Appendix. BRAF G464V KRAS G13C ASXL1 deletion exon 12 TP53 splice site 920-2A>T 6 Disease relevant genes with no reportable alterations: ALK, EGFR, ERBB2, MET, RET, ROS1   PDL1 Expression: 90%  PRIOR THERAPY: None  CURRENT THERAPY: Systemic treatment with combination of chemoimmunotherapy with carboplatin for AUC of 5, Alimta 500 Mg/M2 and Keytruda 200 Mg IV every 3 weeks.  First dose December 08, 2023.  Status post 1 cycle.   INTERVAL HISTORY: Tami Shea 78 y.o. female returns to the  clinic today for a follow-up visit accompanied by her son.  The patient was recently diagnosed with lung cancer.  She is currently undergoing palliative systemic chemotherapy and immunotherapy.  Overall, she tolerated the first cycle of treatment well except a few areas of bruising over her Port-A-Cath site.   Today she states her energy is good. She is concerned about skin lesions near her port-a-cath site. This is likely due to the tape that was placed and then taken off and it did take off the top layer of skin. The area is not infected and does not have any drainage, swelling, or discharge. Recommend paper taper from now on which will be gentler on her skin. She states her appetite has decreased because her taste buds have changed. She does have blurred vision in her left eye, but she has an appointment with her ophthalmologist next week. She states  she is sleeping good at night. She takes stool softener occasionally to help with constipation.   Today she denies any recent illness, fever, chills, night sweats, or unexplained weight loss.  Her breathing is good.  She denies any chest pain, shortness of breath, cough, or hemoptysis.  Denies any nausea or vomiting. Denies diarrhea. Denies any headache. She is here today for evaluation and repeat blood work before undergoing cycle #2.   MEDICAL HISTORY: Past Medical History:  Diagnosis Date   Dyspnea    History of kidney stones    HTN (hypertension)    Smoker     ALLERGIES:  has no known allergies.  MEDICATIONS:  Current Outpatient Medications  Medication Sig Dispense Refill   amLODipine (NORVASC) 10 MG tablet 1 tablet     Cholecalciferol (VITAMIN D3) 1.25 MG (50000 UT) CAPS Take 1 capsule by mouth once a week.     folic acid (FOLVITE) 1 MG tablet Take 1 tablet (1 mg total) by mouth daily. Start 7 days before pemetrexed chemotherapy. Continue until 21 days after pemetrexed completed. 100 tablet 3   lidocaine-prilocaine (EMLA) cream Apply to affected area once 30 g 3   ondansetron (ZOFRAN) 8 MG tablet Take 1 tablet (8 mg total) by mouth every 8 (eight) hours as needed for nausea or vomiting. Start on the third [place] after carboplatin. 30 tablet 1   prochlorperazine (COMPAZINE) 10 MG tablet Take 1 tablet (10 mg total) by mouth every 6 (six) hours as needed for nausea or vomiting. 30 tablet 1   No current facility-administered medications  for this visit.    SURGICAL HISTORY:  Past Surgical History:  Procedure Laterality Date   BRONCHIAL NEEDLE ASPIRATION BIOPSY  10/24/2023   Procedure: BRONCHIAL NEEDLE ASPIRATION BIOPSIES;  Surgeon: Josephine Igo, DO;  Location: [facility];  Service: Pulmonary;;   COLONOSCOPY     IR IMAGING GUIDED PORT INSERTION  12/09/2023   PILONIDAL CYST EXCISION     tonisllectomy     VIDEO BRONCHOSCOPY WITH ENDOBRONCHIAL ULTRASOUND Right 10/24/2023    Procedure: VIDEO BRONCHOSCOPY WITH ENDOBRONCHIAL ULTRASOUND;  Surgeon: Josephine Igo, DO;  Location: [facility];  Service: Pulmonary;  Laterality: Right;    REVIEW OF SYSTEMS:   Review of Systems  Constitutional: Negative for chills, fatigue, fever and unexpected weight change. Positive for decreased appetite.  HENT: Negative for mouth sores, nosebleeds, sore throat and trouble swallowing.   Eyes: Negative for eye problems and icterus.  Respiratory: Negative for cough, hemoptysis, shortness of breath and wheezing.   Cardiovascular: Negative for chest pain and leg swelling.  Gastrointestinal: Negative for abdominal pain, constipation, diarrhea, nausea and vomiting.  Genitourinary: Negative for bladder incontinence, difficulty urinating, dysuria, frequency and hematuria.   Musculoskeletal: Negative for back pain, gait problem, neck pain and neck stiffness.  Skin: Positive for some scabbing over of the skin around the port-a-cath and bruising.  Neurological: Negative for dizziness, extremity weakness, gait problem, headaches, light-headedness and seizures.  Hematological: Negative for adenopathy. Does not bruise/bleed easily.  Psychiatric/Behavioral: Negative for confusion, depression and sleep disturbance. The patient is not nervous/anxious.     PHYSICAL EXAMINATION:  There were no vitals taken for this visit.  ECOG PERFORMANCE STATUS: 1  Physical Exam  Constitutional: Oriented to person, place, and time and well-developed, well-nourished, and in no distress.  HENT:  Head: Normocephalic and atraumatic.  Mouth/Throat: Oropharynx is clear and moist. No oropharyngeal exudate.  Eyes: Conjunctivae are normal. Right eye exhibits no discharge. Left eye exhibits no discharge. No scleral icterus.  Neck: Normal range of motion. Neck supple.  Cardiovascular: Normal rate, regular rhythm, normal heart sounds and intact distal pulses.   Pulmonary/Chest: Effort normal and breath sounds normal. No  respiratory distress. No wheezes. No rales.  Abdominal: Soft. Bowel sounds are normal. Exhibits no distension and no mass. There is no tenderness.  Musculoskeletal: Normal range of motion. Exhibits no edema.  Lymphadenopathy:    No cervical adenopathy.  Neurological: Alert and oriented to person, place, and time. Exhibits normal muscle tone. Gait normal. Coordination normal.  Skin: Skin is warm and dry. Her skin in a square around her port-a-cath site appears to be healing from possible superficial layer of skin being peeled off. No erythema, drainage, or swelling. Not diaphoretic. No pallor.  Psychiatric: Mood, memory and judgment normal.  Vitals reviewed.  LABORATORY DATA: Lab Results  Component Value Date   WBC 3.6 (L) 12/15/2023   HGB 10.0 (L) 12/15/2023   HCT 31.6 (L) 12/15/2023   MCV 78.8 (L) 12/15/2023   PLT 122 (L) 12/15/2023      Chemistry      Component Value Date/Time   NA 137 12/15/2023 1001   NA 142 10/12/2021 1043   K 3.9 12/15/2023 1001   CL 103 12/15/2023 1001   CO2 29 12/15/2023 1001   BUN 23 12/15/2023 1001   BUN 17 10/12/2021 1043   CREATININE 1.24 (H) 12/15/2023 1001      Component Value Date/Time   CALCIUM 8.9 12/15/2023 1001   ALKPHOS 46 12/15/2023 1001   AST 14 (L) 12/15/2023 1001  ALT 8 12/15/2023 1001   BILITOT 0.7 12/15/2023 1001       RADIOGRAPHIC STUDIES:  IR IMAGING GUIDED PORT INSERTION Result Date: 12/09/2023 INDICATION: 78 year old female with advanced stage lung cancer requiring central venous access for chemotherapy administration. EXAM: IMPLANTED PORT A CATH PLACEMENT WITH ULTRASOUND AND FLUOROSCOPIC GUIDANCE COMPARISON:  None Available. MEDICATIONS: None. ANESTHESIA/SEDATION: Moderate (conscious) sedation was employed during this procedure. A total of Versed 1 mg and Fentanyl 25 mcg was administered intravenously. Moderate Sedation Time: 12 minutes. The patient's level of consciousness and vital signs were monitored continuously by  radiology nursing throughout the procedure under my direct supervision. CONTRAST:  None FLUOROSCOPY TIME:  2.3 mGy COMPLICATIONS: None immediate. PROCEDURE: The procedure, risks, benefits, and alternatives were explained to the patient. Questions regarding the procedure were encouraged and answered. The patient understands and consents to the procedure. The right neck and chest were prepped with chlorhexidine in a sterile fashion, and a sterile drape was applied covering the operative field. Maximum barrier sterile technique with sterile gowns and gloves were used for the procedure. A timeout was performed prior to the initiation of the procedure. Ultrasound was used to examine the jugular vein which was compressible and free of internal echoes. A skin marker was used to demarcate the planned venotomy and port pocket incision sites. Local anesthesia was provided to these sites and the subcutaneous tunnel track with 1% lidocaine with 1:100,000 epinephrine. A small incision was created at the jugular access site and blunt dissection was performed of the subcutaneous tissues. Under ultrasound guidance, the jugular vein was accessed with a 21 ga micropuncture needle and an 0.018" wire was inserted to the superior vena cava. Real-time ultrasound guidance was utilized for vascular access including the acquisition of a permanent ultrasound image documenting patency of the accessed vessel. A 5 Fr micopuncture set was then used, through which a 0.035" Rosen wire was passed under fluoroscopic guidance into the inferior vena cava. An 8 Fr dilator was then placed over the wire. A subcutaneous port pocket was then created along the upper chest wall utilizing a combination of sharp and blunt dissection. The pocket was irrigated with sterile saline, packed with gauze, and observed for hemorrhage. A single lumen plastic power injectable port was chosen for placement. The 8 Fr catheter was tunneled from the port pocket site to the  venotomy incision. The port was placed in the pocket. The external catheter was trimmed to appropriate length. The dilator was exchanged for an 8 Fr peel-away sheath under fluoroscopic guidance. The catheter was then placed through the sheath and the sheath was removed. Final catheter positioning was confirmed and documented with a fluoroscopic spot radiograph. The port was accessed with a Huber needle, aspirated, and flushed with heparinized saline. The deep dermal layer of the port pocket incision was closed with interrupted 3-0 Vicryl suture. Dermabond was then placed over the port pocket and neck incisions. The patient tolerated the procedure well without immediate post procedural complication. FINDINGS: After catheter placement, the tip lies within the superior cavoatrial junction. The catheter aspirates and flushes normally and is ready for immediate use. IMPRESSION: Successful placement of a power injectable Port-A-Cath via the right internal jugular vein. The catheter is ready for immediate use. Marliss Coots, MD [facility] Radiology Electronically Signed   By: Marliss Coots M.D.   On: 12/09/2023 10:09     ASSESSMENT/PLAN:  This is a very pleasant 78 year old African-American female with stage IV (T4, N2, M1 B)  non-small cell lung cancer, adenocarcinoma.  She presented with a large right infrahilar mass in addition to right hilar and mediastinal lymphadenopathy as well as a left adrenal metastasis.  She was diagnosed in December 2024.  She had molecular studies performed by [facility] that showed no actionable mutations.  Her PD-L1 expression is 90%.  The patient is currently undergoing chemotherapy and immunotherapy with carboplatin for an AUC of 5, Alimta 500 mg/m, Keytruda 200 mg IV every 3 weeks.  She is status post 1 cycle and tolerated it well.  Labs were reviewed.  Her hemoglobin is trending downward at 8.8 today. Recommend iron  supplementation. Will need weekly labs to monitor this. Recommend that she proceed with cycle #2 today scheduled.  We will see her back for follow-up visit in 3 weeks for evaluation and repeat blood work before undergoing cycle #3.  Remeron offered for appetite stimulation, but the patient declined. Reassurance provided about skin lesions near port-a-cath site. Recommend paper tape in the future. Follow-up with ophthalmology for vision changes in left eye.   Does also appear to be having some worsening anemia.  I will arrange for weekly labs.  I will also arrange for ABO to be drawn next week and have weekly sample blood bank's.  I encouraged that she take an iron supplement.  We would consider her for blood transfusion if her hemoglobin were less than 8.  The patient was advised to call immediately if she has any concerning symptoms in the interval.  The patient voices understanding of current disease status and treatment options and is in agreement with the current care plan. All questions were answered. The patient knows to call the clinic with any problems, questions or concerns. We can certainly see the patient much sooner if necessary     No orders of the defined types were placed in this encounter.    The total time spent in the appointment was 20-29 minutes  Edd Reppert L Zafirah Vanzee, PA-C 12/23/23

## 2023-12-27 MED FILL — Fosaprepitant Dimeglumine For IV Infusion 150 MG (Base Eq): INTRAVENOUS | Qty: 5 | Status: AC

## 2023-12-28 ENCOUNTER — Inpatient Hospital Stay: Payer: Medicare Other | Admitting: Physician Assistant

## 2023-12-28 ENCOUNTER — Inpatient Hospital Stay: Payer: Medicare Other

## 2023-12-28 VITALS — BP 146/88 | HR 88 | Temp 97.3°F | Resp 17 | Wt 147.5 lb

## 2023-12-28 DIAGNOSIS — D649 Anemia, unspecified: Secondary | ICD-10-CM | POA: Diagnosis not present

## 2023-12-28 DIAGNOSIS — Z5111 Encounter for antineoplastic chemotherapy: Secondary | ICD-10-CM | POA: Diagnosis not present

## 2023-12-28 DIAGNOSIS — Z79899 Other long term (current) drug therapy: Secondary | ICD-10-CM | POA: Diagnosis not present

## 2023-12-28 DIAGNOSIS — Z5112 Encounter for antineoplastic immunotherapy: Secondary | ICD-10-CM | POA: Insufficient documentation

## 2023-12-28 DIAGNOSIS — R918 Other nonspecific abnormal finding of lung field: Secondary | ICD-10-CM

## 2023-12-28 DIAGNOSIS — C3431 Malignant neoplasm of lower lobe, right bronchus or lung: Secondary | ICD-10-CM

## 2023-12-28 DIAGNOSIS — C7972 Secondary malignant neoplasm of left adrenal gland: Secondary | ICD-10-CM | POA: Diagnosis not present

## 2023-12-28 DIAGNOSIS — C3491 Malignant neoplasm of unspecified part of right bronchus or lung: Secondary | ICD-10-CM | POA: Diagnosis not present

## 2023-12-28 LAB — CMP (CANCER CENTER ONLY)
ALT: 9 U/L (ref 0–44)
AST: 18 U/L (ref 15–41)
Albumin: 3.4 g/dL — ABNORMAL LOW (ref 3.5–5.0)
Alkaline Phosphatase: 53 U/L (ref 38–126)
Anion gap: 6 (ref 5–15)
BUN: 15 mg/dL (ref 8–23)
CO2: 29 mmol/L (ref 22–32)
Calcium: 9.1 mg/dL (ref 8.9–10.3)
Chloride: 104 mmol/L (ref 98–111)
Creatinine: 1.21 mg/dL — ABNORMAL HIGH (ref 0.44–1.00)
GFR, Estimated: 46 mL/min — ABNORMAL LOW (ref >60)
Glucose, Bld: 87 mg/dL (ref 70–99)
Potassium: 3.8 mmol/L (ref 3.5–5.1)
Sodium: 139 mmol/L (ref 135–145)
Total Bilirubin: 0.4 mg/dL (ref 0.0–1.2)
Total Protein: 7.9 g/dL (ref 6.5–8.1)

## 2023-12-28 LAB — CBC WITH DIFFERENTIAL (CANCER CENTER ONLY)
Abs Immature Granulocytes: 0.01 10*3/uL (ref 0.00–0.07)
Basophils Absolute: 0 10*3/uL (ref 0.0–0.1)
Basophils Relative: 0 %
Eosinophils Absolute: 0 10*3/uL (ref 0.0–0.5)
Eosinophils Relative: 0 %
HCT: 27.7 % — ABNORMAL LOW (ref 36.0–46.0)
Hemoglobin: 8.8 g/dL — ABNORMAL LOW (ref 12.0–15.0)
Immature Granulocytes: 0 %
Lymphocytes Relative: 35 %
Lymphs Abs: 1.6 10*3/uL (ref 0.7–4.0)
MCH: 25.2 pg — ABNORMAL LOW (ref 26.0–34.0)
MCHC: 31.8 g/dL (ref 30.0–36.0)
MCV: 79.4 fL — ABNORMAL LOW (ref 80.0–100.0)
Monocytes Absolute: 1 10*3/uL (ref 0.1–1.0)
Monocytes Relative: 21 %
Neutro Abs: 2 10*3/uL (ref 1.7–7.7)
Neutrophils Relative %: 44 %
Platelet Count: 300 10*3/uL (ref 150–400)
RBC: 3.49 MIL/uL — ABNORMAL LOW (ref 3.87–5.11)
RDW: 16.3 % — ABNORMAL HIGH (ref 11.5–15.5)
WBC Count: 4.6 10*3/uL (ref 4.0–10.5)
nRBC: 0 % (ref 0.0–0.2)

## 2023-12-28 MED ORDER — SODIUM CHLORIDE 0.9 % IV SOLN
200.0000 mg | Freq: Once | INTRAVENOUS | Status: AC
  Administered 2023-12-28: 200 mg via INTRAVENOUS
  Filled 2023-12-28: qty 200

## 2023-12-28 MED ORDER — PEMETREXED DISODIUM CHEMO INJECTION 500 MG
500.0000 mg/m2 | Freq: Once | INTRAVENOUS | Status: AC
  Administered 2023-12-28: 900 mg via INTRAVENOUS
  Filled 2023-12-28: qty 20

## 2023-12-28 MED ORDER — FOSAPREPITANT DIMEGLUMINE INJECTION 150 MG
150.0000 mg | Freq: Once | INTRAVENOUS | Status: AC
  Administered 2023-12-28: 150 mg via INTRAVENOUS
  Filled 2023-12-28: qty 150

## 2023-12-28 MED ORDER — SODIUM CHLORIDE 0.9 % IV SOLN
306.0000 mg | Freq: Once | INTRAVENOUS | Status: AC
  Administered 2023-12-28: 310 mg via INTRAVENOUS
  Filled 2023-12-28: qty 31

## 2023-12-28 MED ORDER — SODIUM CHLORIDE 0.9 % IV SOLN: INTRAVENOUS | Status: DC

## 2023-12-28 MED ORDER — DEXAMETHASONE SODIUM PHOSPHATE 10 MG/ML IJ SOLN
10.0000 mg | Freq: Once | INTRAMUSCULAR | Status: AC
  Administered 2023-12-28: 10 mg via INTRAVENOUS
  Filled 2023-12-28: qty 1

## 2023-12-28 MED ORDER — SODIUM CHLORIDE 0.9% FLUSH
10.0000 mL | Freq: Once | INTRAVENOUS | Status: AC
  Administered 2023-12-28: 10 mL

## 2023-12-28 MED ORDER — SODIUM CHLORIDE 0.9% FLUSH
10.0000 mL | INTRAVENOUS | Status: DC | PRN
  Administered 2023-12-28: 10 mL

## 2023-12-28 MED ORDER — HEPARIN SOD (PORK) LOCK FLUSH 100 UNIT/ML IV SOLN
500.0000 [IU] | Freq: Once | INTRAVENOUS | Status: AC | PRN
  Administered 2023-12-28: 500 [IU]

## 2023-12-28 MED ORDER — PALONOSETRON HCL INJECTION 0.25 MG/5ML
0.2500 mg | Freq: Once | INTRAVENOUS | Status: AC
  Administered 2023-12-28: 0.25 mg via INTRAVENOUS
  Filled 2023-12-28: qty 5

## 2023-12-28 NOTE — Patient Instructions (Signed)
 [facility] - A DEPT OF [facility]  Discharge Instructions: Thank you for choosing [facility] to provide your oncology and hematology care.   If you have a lab appointment with the Cancer Center, please go directly to the Cancer Center and check in at the registration area.   Wear comfortable clothing and clothing appropriate for easy access to any Portacath or PICC line.   We strive to give you quality time with your provider. You may need to reschedule your appointment if you arrive late (15 or more minutes).  Arriving late affects you and other patients whose appointments are after yours.  Also, if you miss three or more appointments without notifying the office, you may be dismissed from the clinic at the provider's discretion.      For prescription refill requests, have your pharmacy contact our office and allow 72 hours for refills to be completed.    Today you received the following chemotherapy and/or immunotherapy agents: Keytruda, Alimta, Carboplatin      To help prevent nausea and vomiting after your treatment, we encourage you to take your nausea medication as directed.  BELOW ARE SYMPTOMS THAT SHOULD BE REPORTED IMMEDIATELY: *FEVER GREATER THAN 100.4 F (38 C) OR HIGHER *CHILLS OR SWEATING *NAUSEA AND VOMITING THAT IS NOT CONTROLLED WITH YOUR NAUSEA MEDICATION *UNUSUAL SHORTNESS OF BREATH *UNUSUAL BRUISING OR BLEEDING *URINARY PROBLEMS (pain or burning when urinating, or frequent urination) *BOWEL PROBLEMS (unusual diarrhea, constipation, pain near the anus) TENDERNESS IN MOUTH AND THROAT WITH OR WITHOUT PRESENCE OF ULCERS (sore throat, sores in mouth, or a toothache) UNUSUAL RASH, SWELLING OR PAIN  UNUSUAL VAGINAL DISCHARGE OR ITCHING   Items with * indicate a potential emergency and should be followed up as soon as possible or go to the Emergency Department if any problems should occur.  Please show the CHEMOTHERAPY ALERT  CARD or IMMUNOTHERAPY ALERT CARD at check-in to the Emergency Department and triage nurse.  Should you have questions after your visit or need to cancel or reschedule your appointment, please contact [facility] - A DEPT [facility]  Dept: [phone]  and follow the prompts.  Office hours are 8:00 a.m. to 4:30 p.m. Monday - Friday. Please note that voicemails left after 4:00 p.m. may not be returned until the following business day.  We are closed weekends and major holidays. You have access to a nurse at all times for urgent questions. Please call the main number to the clinic Dept: [phone] and follow the prompts.   For any non-urgent questions, you may also contact your provider using MyChart. We now offer e-Visits for anyone 35 and older to request care online for non-urgent symptoms. For details visit [email or web address].   Also download the MyChart app! Go to the app store, search "MyChart", open the app, select [place], and log in with your MyChart username and password.

## 2023-12-29 ENCOUNTER — Ambulatory Visit: Payer: Medicare Other | Admitting: Internal Medicine

## 2023-12-30 ENCOUNTER — Encounter: Payer: Self-pay | Admitting: Internal Medicine

## 2023-12-30 NOTE — Progress Notes (Signed)
 Received voicemail from patient regarding EOB she received from her insurance company and if she needed to do anything.  Called patient back to introduce myself and to advise the EOB provides information submitted to the insurance company for a claim that will be billed to insurance. Advised she will receive a bill after her insurance pays if she has a portion remaining.  She has my card for any additional financial questions or concerns.

## 2023-12-31 NOTE — Progress Notes (Signed)
 Updated pemetrexed ERX to [id number] - 500 mg/m2  Pryor Ochoa, PharmD 12/31/23 @ [phone]

## 2024-01-02 ENCOUNTER — Other Ambulatory Visit (HOSPITAL_BASED_OUTPATIENT_CLINIC_OR_DEPARTMENT_OTHER): Payer: Self-pay

## 2024-01-02 DIAGNOSIS — H35363 Drusen (degenerative) of macula, bilateral: Secondary | ICD-10-CM | POA: Diagnosis not present

## 2024-01-02 DIAGNOSIS — H25042 Posterior subcapsular polar age-related cataract, left eye: Secondary | ICD-10-CM | POA: Diagnosis not present

## 2024-01-02 DIAGNOSIS — H2512 Age-related nuclear cataract, left eye: Secondary | ICD-10-CM | POA: Diagnosis not present

## 2024-01-02 DIAGNOSIS — H04123 Dry eye syndrome of bilateral lacrimal glands: Secondary | ICD-10-CM | POA: Diagnosis not present

## 2024-01-04 ENCOUNTER — Inpatient Hospital Stay: Payer: Medicare Other

## 2024-01-04 ENCOUNTER — Other Ambulatory Visit: Payer: Self-pay

## 2024-01-04 DIAGNOSIS — Z5111 Encounter for antineoplastic chemotherapy: Secondary | ICD-10-CM | POA: Diagnosis not present

## 2024-01-04 DIAGNOSIS — R918 Other nonspecific abnormal finding of lung field: Secondary | ICD-10-CM

## 2024-01-04 DIAGNOSIS — D649 Anemia, unspecified: Secondary | ICD-10-CM | POA: Diagnosis not present

## 2024-01-04 DIAGNOSIS — Z79899 Other long term (current) drug therapy: Secondary | ICD-10-CM | POA: Diagnosis not present

## 2024-01-04 DIAGNOSIS — C3491 Malignant neoplasm of unspecified part of right bronchus or lung: Secondary | ICD-10-CM | POA: Diagnosis not present

## 2024-01-04 DIAGNOSIS — C7972 Secondary malignant neoplasm of left adrenal gland: Secondary | ICD-10-CM | POA: Diagnosis not present

## 2024-01-04 DIAGNOSIS — C3431 Malignant neoplasm of lower lobe, right bronchus or lung: Secondary | ICD-10-CM

## 2024-01-04 DIAGNOSIS — Z5112 Encounter for antineoplastic immunotherapy: Secondary | ICD-10-CM | POA: Diagnosis not present

## 2024-01-04 LAB — CBC WITH DIFFERENTIAL (CANCER CENTER ONLY)
Abs Immature Granulocytes: 0.02 10*3/uL (ref 0.00–0.07)
Basophils Absolute: 0 10*3/uL (ref 0.0–0.1)
Basophils Relative: 0 %
Eosinophils Absolute: 0 10*3/uL (ref 0.0–0.5)
Eosinophils Relative: 0 %
HCT: 26.4 % — ABNORMAL LOW (ref 36.0–46.0)
Hemoglobin: 8.4 g/dL — ABNORMAL LOW (ref 12.0–15.0)
Immature Granulocytes: 1 %
Lymphocytes Relative: 42 %
Lymphs Abs: 1.5 10*3/uL (ref 0.7–4.0)
MCH: 25.2 pg — ABNORMAL LOW (ref 26.0–34.0)
MCHC: 31.8 g/dL (ref 30.0–36.0)
MCV: 79.3 fL — ABNORMAL LOW (ref 80.0–100.0)
Monocytes Absolute: 0.1 10*3/uL (ref 0.1–1.0)
Monocytes Relative: 2 %
Neutro Abs: 1.9 10*3/uL (ref 1.7–7.7)
Neutrophils Relative %: 55 %
Platelet Count: 252 10*3/uL (ref 150–400)
RBC: 3.33 MIL/uL — ABNORMAL LOW (ref 3.87–5.11)
RDW: 17.2 % — ABNORMAL HIGH (ref 11.5–15.5)
WBC Count: 3.5 10*3/uL — ABNORMAL LOW (ref 4.0–10.5)
nRBC: 0 % (ref 0.0–0.2)

## 2024-01-04 LAB — SAMPLE TO BLOOD BANK

## 2024-01-04 LAB — ABO/RH: ABO/RH(D): O POS

## 2024-01-04 MED ORDER — HEPARIN SOD (PORK) LOCK FLUSH 100 UNIT/ML IV SOLN
500.0000 [IU] | Freq: Once | INTRAVENOUS | Status: AC
  Administered 2024-01-04: 500 [IU]

## 2024-01-04 MED ORDER — SODIUM CHLORIDE 0.9% FLUSH
10.0000 mL | Freq: Once | INTRAVENOUS | Status: AC
  Administered 2024-01-04: 10 mL

## 2024-01-04 NOTE — Progress Notes (Signed)
 Pt. Here for port flush/labs.  Daughter here concerned with pt. Having decreased appetite.  Also, C/O chest area dark, bruised looking since she had the port inserted.  States she noted the dark areas on her face and right arm since starting the chemo.  Secure chatted to Tammi/RN Dr. Asa Lente nurse and Cassie/PA.  Cassie states pt. Declined the stimulant offered at last visit, and the dark areas on her chest was thought to be from the tape around the port.  Advised pt.'s daughter To see [facility] if things worsen.  Daughter  informed.  Daughter states she would like the Rx. Called to [facility] on [address].  Cassie made aware and states will call the Rx. To her pharmacy.  Daughter made aware.

## 2024-01-05 DIAGNOSIS — I129 Hypertensive chronic kidney disease with stage 1 through stage 4 chronic kidney disease, or unspecified chronic kidney disease: Secondary | ICD-10-CM | POA: Diagnosis not present

## 2024-01-05 DIAGNOSIS — C3490 Malignant neoplasm of unspecified part of unspecified bronchus or lung: Secondary | ICD-10-CM | POA: Diagnosis not present

## 2024-01-05 DIAGNOSIS — I10 Essential (primary) hypertension: Secondary | ICD-10-CM | POA: Diagnosis not present

## 2024-01-05 DIAGNOSIS — E782 Mixed hyperlipidemia: Secondary | ICD-10-CM | POA: Diagnosis not present

## 2024-01-05 DIAGNOSIS — J9801 Acute bronchospasm: Secondary | ICD-10-CM | POA: Diagnosis not present

## 2024-01-10 ENCOUNTER — Other Ambulatory Visit: Payer: Self-pay

## 2024-01-10 DIAGNOSIS — D649 Anemia, unspecified: Secondary | ICD-10-CM

## 2024-01-10 DIAGNOSIS — C3431 Malignant neoplasm of lower lobe, right bronchus or lung: Secondary | ICD-10-CM

## 2024-01-11 ENCOUNTER — Other Ambulatory Visit: Payer: Self-pay | Admitting: Medical Oncology

## 2024-01-11 ENCOUNTER — Encounter: Payer: Self-pay | Admitting: Internal Medicine

## 2024-01-11 ENCOUNTER — Inpatient Hospital Stay: Payer: Medicare Other | Attending: Internal Medicine

## 2024-01-11 ENCOUNTER — Telehealth: Payer: Self-pay | Admitting: Medical Oncology

## 2024-01-11 ENCOUNTER — Telehealth: Payer: Self-pay | Admitting: *Deleted

## 2024-01-11 ENCOUNTER — Encounter: Payer: Self-pay | Admitting: Medical Oncology

## 2024-01-11 ENCOUNTER — Other Ambulatory Visit: Payer: Self-pay | Admitting: Physician Assistant

## 2024-01-11 DIAGNOSIS — Z79899 Other long term (current) drug therapy: Secondary | ICD-10-CM | POA: Diagnosis not present

## 2024-01-11 DIAGNOSIS — C3431 Malignant neoplasm of lower lobe, right bronchus or lung: Secondary | ICD-10-CM

## 2024-01-11 DIAGNOSIS — Z87891 Personal history of nicotine dependence: Secondary | ICD-10-CM | POA: Diagnosis not present

## 2024-01-11 DIAGNOSIS — Z5112 Encounter for antineoplastic immunotherapy: Secondary | ICD-10-CM | POA: Diagnosis not present

## 2024-01-11 DIAGNOSIS — Z5111 Encounter for antineoplastic chemotherapy: Secondary | ICD-10-CM | POA: Insufficient documentation

## 2024-01-11 DIAGNOSIS — D61818 Other pancytopenia: Secondary | ICD-10-CM | POA: Diagnosis not present

## 2024-01-11 DIAGNOSIS — C3491 Malignant neoplasm of unspecified part of right bronchus or lung: Secondary | ICD-10-CM | POA: Diagnosis not present

## 2024-01-11 DIAGNOSIS — R918 Other nonspecific abnormal finding of lung field: Secondary | ICD-10-CM

## 2024-01-11 DIAGNOSIS — D649 Anemia, unspecified: Secondary | ICD-10-CM

## 2024-01-11 LAB — CBC WITH DIFFERENTIAL (CANCER CENTER ONLY)
Abs Immature Granulocytes: 0 10*3/uL (ref 0.00–0.07)
Basophils Absolute: 0 10*3/uL (ref 0.0–0.1)
Basophils Relative: 0 %
Eosinophils Absolute: 0 10*3/uL (ref 0.0–0.5)
Eosinophils Relative: 1 %
HCT: 22.2 % — ABNORMAL LOW (ref 36.0–46.0)
Hemoglobin: 7.1 g/dL — ABNORMAL LOW (ref 12.0–15.0)
Immature Granulocytes: 0 %
Lymphocytes Relative: 73 %
Lymphs Abs: 1.6 10*3/uL (ref 0.7–4.0)
MCH: 24.9 pg — ABNORMAL LOW (ref 26.0–34.0)
MCHC: 32 g/dL (ref 30.0–36.0)
MCV: 77.9 fL — ABNORMAL LOW (ref 80.0–100.0)
Monocytes Absolute: 0.2 10*3/uL (ref 0.1–1.0)
Monocytes Relative: 10 %
Neutro Abs: 0.3 10*3/uL — CL (ref 1.7–7.7)
Neutrophils Relative %: 16 %
Platelet Count: 25 10*3/uL — ABNORMAL LOW (ref 150–400)
RBC: 2.85 MIL/uL — ABNORMAL LOW (ref 3.87–5.11)
RDW: 17 % — ABNORMAL HIGH (ref 11.5–15.5)
Smear Review: NORMAL
WBC Count: 2 10*3/uL — ABNORMAL LOW (ref 4.0–10.5)
nRBC: 0 % (ref 0.0–0.2)

## 2024-01-11 LAB — SAMPLE TO BLOOD BANK

## 2024-01-11 LAB — PREPARE RBC (CROSSMATCH)

## 2024-01-11 MED ORDER — SODIUM CHLORIDE 0.9% FLUSH
10.0000 mL | Freq: Once | INTRAVENOUS | Status: AC
  Administered 2024-01-11: 10 mL

## 2024-01-11 MED ORDER — HEPARIN SOD (PORK) LOCK FLUSH 100 UNIT/ML IV SOLN
500.0000 [IU] | Freq: Once | INTRAVENOUS | Status: AC
  Administered 2024-01-11: 500 [IU]

## 2024-01-11 NOTE — Progress Notes (Unsigned)
 Blood /plt tranfusion ordered . Blood orders released.

## 2024-01-11 NOTE — Telephone Encounter (Signed)
 CRITICAL VALUE STICKER  CRITICAL VALUE: ANC 0.3  RECEIVER (on-site recipient of call):  DATE & TIME NOTIFIED: 01/11/2024  MESSENGER (representative from lab):  MD NOTIFIED: Mohamed  TIME OF NOTIFICATION: 1125  RESPONSE: possible injection

## 2024-01-11 NOTE — Telephone Encounter (Signed)
 Confirmed appts for tomorrow for platelets and blood transfusion and GCSF if insurance authorizes it.

## 2024-01-11 NOTE — Progress Notes (Unsigned)
 CRITICAL VALUE STICKER  CRITICAL VALUE: Plts 25 k, HGB 7.1  RECEIVER (on-site recipient of call):Brittain Hosie  DATE & TIME NOTIFIED: 01/11/2024@105   MESSENGER (representative from lab):  MD NOTIFIED: MOHAMED  TIME OF NOTIFICATION: 1056  RESPONSE:  Transfuse   Dtr stated pt had nosebleeds yesterday , No other bleeding. She lives with pt.

## 2024-01-12 ENCOUNTER — Inpatient Hospital Stay (HOSPITAL_BASED_OUTPATIENT_CLINIC_OR_DEPARTMENT_OTHER)

## 2024-01-12 VITALS — BP 131/90 | HR 71 | Temp 98.2°F | Resp 16 | Wt 148.1 lb

## 2024-01-12 DIAGNOSIS — Z5112 Encounter for antineoplastic immunotherapy: Secondary | ICD-10-CM | POA: Diagnosis not present

## 2024-01-12 DIAGNOSIS — Z87891 Personal history of nicotine dependence: Secondary | ICD-10-CM | POA: Diagnosis not present

## 2024-01-12 DIAGNOSIS — Z5111 Encounter for antineoplastic chemotherapy: Secondary | ICD-10-CM | POA: Diagnosis not present

## 2024-01-12 DIAGNOSIS — Z79899 Other long term (current) drug therapy: Secondary | ICD-10-CM | POA: Diagnosis not present

## 2024-01-12 DIAGNOSIS — C3491 Malignant neoplasm of unspecified part of right bronchus or lung: Secondary | ICD-10-CM | POA: Diagnosis not present

## 2024-01-12 DIAGNOSIS — R918 Other nonspecific abnormal finding of lung field: Secondary | ICD-10-CM

## 2024-01-12 DIAGNOSIS — D61818 Other pancytopenia: Secondary | ICD-10-CM | POA: Diagnosis not present

## 2024-01-12 DIAGNOSIS — C3431 Malignant neoplasm of lower lobe, right bronchus or lung: Secondary | ICD-10-CM

## 2024-01-12 DIAGNOSIS — D649 Anemia, unspecified: Secondary | ICD-10-CM

## 2024-01-12 MED ORDER — FILGRASTIM-SNDZ 300 MCG/0.5ML IJ SOSY
300.0000 ug | PREFILLED_SYRINGE | Freq: Once | INTRAMUSCULAR | Status: AC
Start: 2024-01-12 — End: 2024-01-12
  Administered 2024-01-12: 300 ug via SUBCUTANEOUS
  Filled 2024-01-12: qty 0.5

## 2024-01-12 MED ORDER — HEPARIN SOD (PORK) LOCK FLUSH 100 UNIT/ML IV SOLN
500.0000 [IU] | Freq: Every day | INTRAVENOUS | Status: AC | PRN
Start: 2024-01-12 — End: 2024-01-12
  Administered 2024-01-12: 500 [IU]

## 2024-01-12 MED ORDER — ACETAMINOPHEN 325 MG PO TABS
650.0000 mg | ORAL_TABLET | Freq: Once | ORAL | Status: AC
  Administered 2024-01-12: 650 mg via ORAL
  Filled 2024-01-12: qty 2

## 2024-01-12 MED ORDER — SODIUM CHLORIDE 0.9% FLUSH
10.0000 mL | INTRAVENOUS | Status: AC | PRN
Start: 2024-01-12 — End: 2024-01-12
  Administered 2024-01-12: 10 mL

## 2024-01-12 MED ORDER — SODIUM CHLORIDE 0.9% IV SOLUTION
250.0000 mL | INTRAVENOUS | Status: DC
  Administered 2024-01-12: 100 mL via INTRAVENOUS

## 2024-01-12 MED ORDER — DIPHENHYDRAMINE HCL 25 MG PO CAPS
25.0000 mg | ORAL_CAPSULE | Freq: Once | ORAL | Status: AC
Start: 2024-01-12 — End: 2024-01-12
  Administered 2024-01-12: 25 mg via ORAL
  Filled 2024-01-12: qty 1

## 2024-01-12 NOTE — Patient Instructions (Signed)

## 2024-01-13 ENCOUNTER — Inpatient Hospital Stay

## 2024-01-13 VITALS — BP 137/97 | Temp 97.7°F | Resp 18

## 2024-01-13 DIAGNOSIS — C3431 Malignant neoplasm of lower lobe, right bronchus or lung: Secondary | ICD-10-CM

## 2024-01-13 DIAGNOSIS — Z5111 Encounter for antineoplastic chemotherapy: Secondary | ICD-10-CM | POA: Diagnosis not present

## 2024-01-13 DIAGNOSIS — Z87891 Personal history of nicotine dependence: Secondary | ICD-10-CM | POA: Diagnosis not present

## 2024-01-13 DIAGNOSIS — C3491 Malignant neoplasm of unspecified part of right bronchus or lung: Secondary | ICD-10-CM | POA: Diagnosis not present

## 2024-01-13 DIAGNOSIS — D61818 Other pancytopenia: Secondary | ICD-10-CM | POA: Diagnosis not present

## 2024-01-13 DIAGNOSIS — R918 Other nonspecific abnormal finding of lung field: Secondary | ICD-10-CM

## 2024-01-13 DIAGNOSIS — Z5112 Encounter for antineoplastic immunotherapy: Secondary | ICD-10-CM | POA: Diagnosis not present

## 2024-01-13 DIAGNOSIS — Z79899 Other long term (current) drug therapy: Secondary | ICD-10-CM | POA: Diagnosis not present

## 2024-01-13 LAB — BPAM PLATELET PHERESIS
Blood Product Expiration Date: 202503072359
ISSUE DATE / TIME: 202503060858
Unit Type and Rh: 5100

## 2024-01-13 LAB — TYPE AND SCREEN
ABO/RH(D): O POS
Antibody Screen: NEGATIVE
Unit division: 0
Unit division: 0

## 2024-01-13 LAB — PREPARE PLATELET PHERESIS: Unit division: 0

## 2024-01-13 LAB — BPAM RBC
Blood Product Expiration Date: 202504062359
Blood Product Expiration Date: 202504062359
ISSUE DATE / TIME: 202503060858
ISSUE DATE / TIME: 202503060858
Unit Type and Rh: 5100
Unit Type and Rh: 5100

## 2024-01-13 MED ORDER — FILGRASTIM-SNDZ 300 MCG/0.5ML IJ SOSY
300.0000 ug | PREFILLED_SYRINGE | Freq: Once | INTRAMUSCULAR | Status: AC
  Administered 2024-01-13: 300 ug via SUBCUTANEOUS
  Filled 2024-01-13: qty 0.5

## 2024-01-14 ENCOUNTER — Inpatient Hospital Stay

## 2024-01-14 VITALS — BP 147/97 | HR 78 | Temp 98.0°F | Resp 16

## 2024-01-14 DIAGNOSIS — Z79899 Other long term (current) drug therapy: Secondary | ICD-10-CM | POA: Diagnosis not present

## 2024-01-14 DIAGNOSIS — D61818 Other pancytopenia: Secondary | ICD-10-CM | POA: Diagnosis not present

## 2024-01-14 DIAGNOSIS — R918 Other nonspecific abnormal finding of lung field: Secondary | ICD-10-CM

## 2024-01-14 DIAGNOSIS — Z87891 Personal history of nicotine dependence: Secondary | ICD-10-CM | POA: Diagnosis not present

## 2024-01-14 DIAGNOSIS — Z5112 Encounter for antineoplastic immunotherapy: Secondary | ICD-10-CM | POA: Diagnosis not present

## 2024-01-14 DIAGNOSIS — C3491 Malignant neoplasm of unspecified part of right bronchus or lung: Secondary | ICD-10-CM | POA: Diagnosis not present

## 2024-01-14 DIAGNOSIS — C3431 Malignant neoplasm of lower lobe, right bronchus or lung: Secondary | ICD-10-CM

## 2024-01-14 DIAGNOSIS — Z5111 Encounter for antineoplastic chemotherapy: Secondary | ICD-10-CM | POA: Diagnosis not present

## 2024-01-14 MED ORDER — FILGRASTIM-SNDZ 300 MCG/0.5ML IJ SOSY
300.0000 ug | PREFILLED_SYRINGE | Freq: Once | INTRAMUSCULAR | Status: AC
Start: 2024-01-14 — End: 2024-01-14
  Administered 2024-01-14: 300 ug via SUBCUTANEOUS

## 2024-01-15 ENCOUNTER — Other Ambulatory Visit: Payer: Self-pay

## 2024-01-17 MED FILL — Fosaprepitant Dimeglumine For IV Infusion 150 MG (Base Eq): INTRAVENOUS | Qty: 5 | Status: AC

## 2024-01-18 ENCOUNTER — Inpatient Hospital Stay: Payer: Medicare Other

## 2024-01-18 ENCOUNTER — Other Ambulatory Visit: Payer: Self-pay | Admitting: Medical Oncology

## 2024-01-18 ENCOUNTER — Inpatient Hospital Stay: Payer: Medicare Other | Admitting: Internal Medicine

## 2024-01-18 VITALS — BP 130/83 | HR 78 | Temp 97.9°F | Resp 17 | Ht 64.0 in | Wt 144.3 lb

## 2024-01-18 DIAGNOSIS — R918 Other nonspecific abnormal finding of lung field: Secondary | ICD-10-CM

## 2024-01-18 DIAGNOSIS — C3431 Malignant neoplasm of lower lobe, right bronchus or lung: Secondary | ICD-10-CM

## 2024-01-18 DIAGNOSIS — D61818 Other pancytopenia: Secondary | ICD-10-CM | POA: Diagnosis not present

## 2024-01-18 DIAGNOSIS — Z5112 Encounter for antineoplastic immunotherapy: Secondary | ICD-10-CM | POA: Diagnosis not present

## 2024-01-18 DIAGNOSIS — C3491 Malignant neoplasm of unspecified part of right bronchus or lung: Secondary | ICD-10-CM | POA: Diagnosis not present

## 2024-01-18 DIAGNOSIS — Z5111 Encounter for antineoplastic chemotherapy: Secondary | ICD-10-CM | POA: Diagnosis not present

## 2024-01-18 DIAGNOSIS — Z87891 Personal history of nicotine dependence: Secondary | ICD-10-CM | POA: Diagnosis not present

## 2024-01-18 DIAGNOSIS — Z79899 Other long term (current) drug therapy: Secondary | ICD-10-CM | POA: Diagnosis not present

## 2024-01-18 DIAGNOSIS — Z95828 Presence of other vascular implants and grafts: Secondary | ICD-10-CM

## 2024-01-18 LAB — CBC WITH DIFFERENTIAL (CANCER CENTER ONLY)
Abs Immature Granulocytes: 2.03 10*3/uL — ABNORMAL HIGH (ref 0.00–0.07)
Basophils Absolute: 0.1 10*3/uL (ref 0.0–0.1)
Basophils Relative: 1 %
Eosinophils Absolute: 0 10*3/uL (ref 0.0–0.5)
Eosinophils Relative: 0 %
HCT: 27.9 % — ABNORMAL LOW (ref 36.0–46.0)
Hemoglobin: 9.1 g/dL — ABNORMAL LOW (ref 12.0–15.0)
Immature Granulocytes: 14 %
Lymphocytes Relative: 17 %
Lymphs Abs: 2.4 10*3/uL (ref 0.7–4.0)
MCH: 26.1 pg (ref 26.0–34.0)
MCHC: 32.6 g/dL (ref 30.0–36.0)
MCV: 80.2 fL (ref 80.0–100.0)
Monocytes Absolute: 2.6 10*3/uL — ABNORMAL HIGH (ref 0.1–1.0)
Monocytes Relative: 18 %
Neutro Abs: 7.1 10*3/uL (ref 1.7–7.7)
Neutrophils Relative %: 50 %
Platelet Count: 80 10*3/uL — ABNORMAL LOW (ref 150–400)
RBC: 3.48 MIL/uL — ABNORMAL LOW (ref 3.87–5.11)
RDW: 17.9 % — ABNORMAL HIGH (ref 11.5–15.5)
Smear Review: NORMAL
WBC Count: 14.2 10*3/uL — ABNORMAL HIGH (ref 4.0–10.5)
nRBC: 0.4 % — ABNORMAL HIGH (ref 0.0–0.2)

## 2024-01-18 LAB — CMP (CANCER CENTER ONLY)
ALT: 10 U/L (ref 0–44)
AST: 20 U/L (ref 15–41)
Albumin: 3.4 g/dL — ABNORMAL LOW (ref 3.5–5.0)
Alkaline Phosphatase: 69 U/L (ref 38–126)
Anion gap: 8 (ref 5–15)
BUN: 16 mg/dL (ref 8–23)
CO2: 29 mmol/L (ref 22–32)
Calcium: 8.8 mg/dL — ABNORMAL LOW (ref 8.9–10.3)
Chloride: 104 mmol/L (ref 98–111)
Creatinine: 1.36 mg/dL — ABNORMAL HIGH (ref 0.44–1.00)
GFR, Estimated: 40 mL/min — ABNORMAL LOW (ref >60)
Glucose, Bld: 93 mg/dL (ref 70–99)
Potassium: 3.2 mmol/L — ABNORMAL LOW (ref 3.5–5.1)
Sodium: 141 mmol/L (ref 135–145)
Total Bilirubin: 0.4 mg/dL (ref 0.0–1.2)
Total Protein: 7.8 g/dL (ref 6.5–8.1)

## 2024-01-18 LAB — TSH: TSH: 0.704 u[IU]/mL (ref 0.350–4.500)

## 2024-01-18 MED ORDER — HEPARIN SOD (PORK) LOCK FLUSH 100 UNIT/ML IV SOLN
500.0000 [IU] | Freq: Once | INTRAVENOUS | Status: DC
Start: 2024-01-18 — End: 2024-01-18

## 2024-01-18 MED ORDER — HEPARIN SOD (PORK) LOCK FLUSH 100 UNIT/ML IV SOLN
500.0000 [IU] | Freq: Once | INTRAVENOUS | Status: AC
Start: 2024-01-18 — End: 2024-01-18
  Administered 2024-01-18: 500 [IU]

## 2024-01-18 MED ORDER — SODIUM CHLORIDE 0.9% FLUSH
10.0000 mL | Freq: Once | INTRAVENOUS | Status: AC
  Administered 2024-01-18: 10 mL

## 2024-01-18 MED ORDER — SODIUM CHLORIDE 0.9% FLUSH
10.0000 mL | INTRAVENOUS | Status: AC
  Administered 2024-01-18: 10 mL via INTRAVENOUS

## 2024-01-18 NOTE — Progress Notes (Signed)
 [facility] Telephone:(336) [phone]   Fax:(336) [phone]  OFFICE PROGRESS NOTE  [name], MD [address] [place] [address]  DIAGNOSIS: stage IV (T4, N2, M1 B) non-small cell lung cancer, adenocarcinoma presented with large right infrahilar mass in addition to right hilar and mediastinal lymphadenopathy as well as left adrenal gland metastasis diagnosed in December 2024.   Biomarker Findings Tumor Mutational Burden - 24 Muts/Mb HRD signature - HRDsig Negative Microsatellite status - MS-Stable Genomic Findings For a complete list of the genes assayed, please refer to the Appendix. BRAF G464V KRAS G13C ASXL1 deletion exon 12 TP53 splice site 920-2A>T 6 Disease relevant genes with no reportable alterations: ALK, EGFR, ERBB2, MET, RET, ROS1  PDL1 Expression: 90%  PRIOR THERAPY: None  CURRENT THERAPY: Systemic treatment with combination of chemoimmunotherapy with carboplatin for AUC of 5, Alimta 500 Mg/M2 and Keytruda 200 Mg IV every 3 weeks.  First dose December 08, 2023.  Status post 2 cycles.  Starting from cycle #3 her dose of carboplatin will be for AUC of 4 and Alimta 400 Mg/M2 secondary to pancytopenia.  INTERVAL HISTORY: Tami Shea 78 y.o. female returns to the clinic today for follow-up visit accompanied by her daughter. Discussed the use of AI scribe software for clinical note transcription with the patient, who gave verbal consent to proceed.  History of Present Illness   The patient is a 78 year old with stage four non-small cell lung cancer adenocarcinoma who presents for chemotherapy treatment. She is accompanied by her daughter.  Diagnosed with stage four non-small cell lung cancer adenocarcinoma in December 2024, the tumor showed high PD-L1 expression of 90% without any mutation. She commenced chemotherapy with carboplatin, Alimta, and Keytruda every three weeks and has completed two cycles. The third cycle was postponed due to  thrombocytopenia, with a platelet count of 80,000, which is insufficient for chemotherapy. She received a transfusion and injections last Wednesday and Thursday.  She experiences epistaxis after using a nebulizer at home, potentially related to low platelet counts. Additionally, she reports changes in taste, affecting her appetite, although she recently managed to eat a whole sub sandwich, which is atypical for her. Her daughter encourages her to drink Glycine.  She has issues with her eyes, particularly watery eyes after using prescribed eye drops from the [facility]. Cataract surgery has been recommended but is postponed until after completing the first four cycles of chemotherapy.       MEDICAL HISTORY: Past Medical History:  Diagnosis Date   Dyspnea    History of kidney stones    HTN (hypertension)    Smoker     ALLERGIES:  has no known allergies.  MEDICATIONS:  Current Outpatient Medications  Medication Sig Dispense Refill   amLODipine (NORVASC) 10 MG tablet 1 tablet     Cholecalciferol (VITAMIN D3) 1.25 MG (50000 UT) CAPS Take 1 capsule by mouth once a week.     folic acid (FOLVITE) 1 MG tablet Take 1 tablet (1 mg total) by mouth daily. Start 7 days before pemetrexed chemotherapy. Continue until 21 days after pemetrexed completed. 100 tablet 3   lidocaine-prilocaine (EMLA) cream Apply to affected area once 30 g 3   ondansetron (ZOFRAN) 8 MG tablet Take 1 tablet (8 mg total) by mouth every 8 (eight) hours as needed for nausea or vomiting. Start on the third Blann after carboplatin. 30 tablet 1   prochlorperazine (COMPAZINE) 10 MG tablet Take 1 tablet (10  mg total) by mouth every 6 (six) hours as needed for nausea or vomiting. 30 tablet 1   No current facility-administered medications for this visit.    SURGICAL HISTORY:  Past Surgical History:  Procedure Laterality Date   BRONCHIAL NEEDLE ASPIRATION BIOPSY  10/24/2023   Procedure: BRONCHIAL NEEDLE ASPIRATION BIOPSIES;   Surgeon: Josephine Igo, DO;  Location: [facility];  Service: Pulmonary;;   COLONOSCOPY     IR IMAGING GUIDED PORT INSERTION  12/09/2023   PILONIDAL CYST EXCISION     tonisllectomy     VIDEO BRONCHOSCOPY WITH ENDOBRONCHIAL ULTRASOUND Right 10/24/2023   Procedure: VIDEO BRONCHOSCOPY WITH ENDOBRONCHIAL ULTRASOUND;  Surgeon: Josephine Igo, DO;  Location: [facility];  Service: Pulmonary;  Laterality: Right;    REVIEW OF SYSTEMS:  Constitutional: positive for fatigue Eyes: positive for visual disturbance Ears, nose, mouth, throat, and face: negative Respiratory: negative Cardiovascular: negative Gastrointestinal: negative Genitourinary:negative Integument/breast: negative Hematologic/lymphatic: negative Musculoskeletal:negative Neurological: negative Behavioral/Psych: negative Endocrine: negative Allergic/Immunologic: negative   PHYSICAL EXAMINATION: General appearance: alert, cooperative, fatigued, and no distress Head: Normocephalic, without obvious abnormality, atraumatic Neck: no adenopathy, no JVD, supple, symmetrical, trachea midline, and thyroid not enlarged, symmetric, no tenderness/mass/nodules Lymph nodes: Cervical, supraclavicular, and axillary nodes normal. Resp: clear to auscultation bilaterally Back: symmetric, no curvature. ROM normal. No CVA tenderness. Cardio: regular rate and rhythm, S1, S2 normal, no murmur, click, rub or gallop GI: soft, non-tender; bowel sounds normal; no masses,  no organomegaly Extremities: extremities normal, atraumatic, no cyanosis or edema Neurologic: Alert and oriented X 3, normal strength and tone. Normal symmetric reflexes. Normal coordination and gait  ECOG PERFORMANCE STATUS: 1 - Symptomatic but completely ambulatory  Blood pressure 130/83, pulse 78, temperature 97.9 F (36.6 C), temperature source Temporal, resp. rate 17, height 5\' 4"  (1.626 m), weight 144 lb 4.8 oz (65.5 kg), SpO2 96%.  LABORATORY DATA: Lab Results   Component Value Date   WBC 14.2 (H) 01/18/2024   HGB 9.1 (L) 01/18/2024   HCT 27.9 (L) 01/18/2024   MCV 80.2 01/18/2024   PLT 80 (L) 01/18/2024      Chemistry      Component Value Date/Time   NA 139 12/28/2023 1008   NA 142 10/12/2021 1043   K 3.8 12/28/2023 1008   CL 104 12/28/2023 1008   CO2 29 12/28/2023 1008   BUN 15 12/28/2023 1008   BUN 17 10/12/2021 1043   CREATININE 1.21 (H) 12/28/2023 1008      Component Value Date/Time   CALCIUM 9.1 12/28/2023 1008   ALKPHOS 53 12/28/2023 1008   AST 18 12/28/2023 1008   ALT 9 12/28/2023 1008   BILITOT 0.4 12/28/2023 1008       RADIOGRAPHIC STUDIES: No results found.   ASSESSMENT AND PLAN: This is a very pleasant 78 years old African-American female with  stage IV (T4, N2, M1 B) non-small cell lung cancer, adenocarcinoma presented with large right infrahilar mass in addition to right hilar and mediastinal lymphadenopathy as well as left adrenal gland metastasis diagnosed in December 2024.  She had molecular studies by [facility] that showed no actionable mutations and PD-L1 TPS of 90%. She is currently undergoing treatment with chemoimmunotherapy with carboplatin for AUC of 5, Alimta 500 Mg/M2 and Keytruda 200 Mg IV every 3 weeks for 4 cycles followed by maintenance treatment with Alimta and Keytruda.  Status post 2 cycle.     Stage IV non-small cell lung cancer (NSCLC) adenocarcinoma Diagnosed in December 2024 with stage IV NSCLC adenocarcinoma.  Tumor exhibits high PD-L1 expression (90%) without mutations. Undergoing chemotherapy with carboplatin, Alimta, and Keytruda every three weeks. Two cycles completed; however, low platelet count (80,000) precludes third cycle today. Chemotherapy dosages will be adjusted to mitigate side effects and facilitate platelet recovery. - Delay third chemotherapy cycle by one week for platelet recovery. - Adjust carboplatin to AUC of 4 and Alimta to 400 mg/m. - Repeat blood work in one week to  assess platelet count. - Plan a scan in three weeks to evaluate treatment response.  Anemia Anemia present, managed with recent transfusion and injections. Monitoring required for treatment adjustments. - Monitor hemoglobin levels and adjust treatment as necessary.  Epistaxis Epistaxis following nebulizer treatment, likely due to low platelet count rather than nebulizer use. Nebulizer-induced dryness may contribute. - Continue nebulizer treatments as needed.  Cataracts Cataract surgery recommended but deferred until completion of the first four chemotherapy cycles. Experiences watery eyes with eye drops, possibly related to cataract treatment. Surgery postponed to avoid interference with chemotherapy. - Defer cataract surgery until after the first four chemotherapy cycles.   The patient was advised to call immediately if she has any concerning symptoms in the interval. The patient voices understanding of current disease status and treatment options and is in agreement with the current care plan.  All questions were answered. The patient knows to call the clinic with any problems, questions or concerns. We can certainly see the patient much sooner if necessary.  The total time spent in the appointment was 30 minutes.  Disclaimer: This note was dictated with voice recognition software. Similar sounding words can inadvertently be transcribed and may not be corrected upon review.

## 2024-01-18 NOTE — Addendum Note (Signed)
 Addended by: Charma Igo on: 01/18/2024 11:19 AM   Modules accepted: Orders

## 2024-01-19 ENCOUNTER — Other Ambulatory Visit: Payer: Self-pay

## 2024-01-19 ENCOUNTER — Encounter: Payer: Self-pay | Admitting: Internal Medicine

## 2024-01-19 LAB — T4: T4, Total: 7 ug/dL (ref 4.5–12.0)

## 2024-01-25 ENCOUNTER — Inpatient Hospital Stay: Admitting: Dietician

## 2024-01-25 ENCOUNTER — Telehealth: Payer: Self-pay | Admitting: *Deleted

## 2024-01-25 ENCOUNTER — Inpatient Hospital Stay: Payer: Medicare Other

## 2024-01-25 ENCOUNTER — Encounter: Payer: Self-pay | Admitting: *Deleted

## 2024-01-25 DIAGNOSIS — C3491 Malignant neoplasm of unspecified part of right bronchus or lung: Secondary | ICD-10-CM | POA: Diagnosis not present

## 2024-01-25 DIAGNOSIS — Z5111 Encounter for antineoplastic chemotherapy: Secondary | ICD-10-CM | POA: Diagnosis not present

## 2024-01-25 DIAGNOSIS — C3431 Malignant neoplasm of lower lobe, right bronchus or lung: Secondary | ICD-10-CM

## 2024-01-25 DIAGNOSIS — Z87891 Personal history of nicotine dependence: Secondary | ICD-10-CM | POA: Diagnosis not present

## 2024-01-25 DIAGNOSIS — Z5112 Encounter for antineoplastic immunotherapy: Secondary | ICD-10-CM | POA: Diagnosis not present

## 2024-01-25 DIAGNOSIS — Z79899 Other long term (current) drug therapy: Secondary | ICD-10-CM | POA: Diagnosis not present

## 2024-01-25 DIAGNOSIS — D61818 Other pancytopenia: Secondary | ICD-10-CM | POA: Diagnosis not present

## 2024-01-25 DIAGNOSIS — R918 Other nonspecific abnormal finding of lung field: Secondary | ICD-10-CM

## 2024-01-25 LAB — CMP (CANCER CENTER ONLY)
ALT: 7 U/L (ref 0–44)
AST: 18 U/L (ref 15–41)
Albumin: 3.6 g/dL (ref 3.5–5.0)
Alkaline Phosphatase: 54 U/L (ref 38–126)
Anion gap: 6 (ref 5–15)
BUN: 23 mg/dL (ref 8–23)
CO2: 28 mmol/L (ref 22–32)
Calcium: 9.3 mg/dL (ref 8.9–10.3)
Chloride: 107 mmol/L (ref 98–111)
Creatinine: 1.29 mg/dL — ABNORMAL HIGH (ref 0.44–1.00)
GFR, Estimated: 43 mL/min — ABNORMAL LOW (ref >60)
Glucose, Bld: 92 mg/dL (ref 70–99)
Potassium: 3.7 mmol/L (ref 3.5–5.1)
Sodium: 141 mmol/L (ref 135–145)
Total Bilirubin: 0.5 mg/dL (ref 0.0–1.2)
Total Protein: 8.2 g/dL — ABNORMAL HIGH (ref 6.5–8.1)

## 2024-01-25 LAB — CBC WITH DIFFERENTIAL (CANCER CENTER ONLY)
Abs Immature Granulocytes: 0.05 10*3/uL (ref 0.00–0.07)
Basophils Absolute: 0 10*3/uL (ref 0.0–0.1)
Basophils Relative: 0 %
Eosinophils Absolute: 0.1 10*3/uL (ref 0.0–0.5)
Eosinophils Relative: 1 %
HCT: 26.8 % — ABNORMAL LOW (ref 36.0–46.0)
Hemoglobin: 8.7 g/dL — ABNORMAL LOW (ref 12.0–15.0)
Immature Granulocytes: 1 %
Lymphocytes Relative: 19 %
Lymphs Abs: 1.7 10*3/uL (ref 0.7–4.0)
MCH: 26.6 pg (ref 26.0–34.0)
MCHC: 32.5 g/dL (ref 30.0–36.0)
MCV: 82 fL (ref 80.0–100.0)
Monocytes Absolute: 0.9 10*3/uL (ref 0.1–1.0)
Monocytes Relative: 10 %
Neutro Abs: 6.1 10*3/uL (ref 1.7–7.7)
Neutrophils Relative %: 69 %
Platelet Count: 286 10*3/uL (ref 150–400)
RBC: 3.27 MIL/uL — ABNORMAL LOW (ref 3.87–5.11)
RDW: 20.9 % — ABNORMAL HIGH (ref 11.5–15.5)
WBC Count: 8.7 10*3/uL (ref 4.0–10.5)
nRBC: 0 % (ref 0.0–0.2)

## 2024-01-25 LAB — SAMPLE TO BLOOD BANK

## 2024-01-25 LAB — TSH: TSH: 0.732 u[IU]/mL (ref 0.350–4.500)

## 2024-01-25 MED ORDER — HEPARIN SOD (PORK) LOCK FLUSH 100 UNIT/ML IV SOLN
500.0000 [IU] | Freq: Once | INTRAVENOUS | Status: AC
Start: 2024-01-25 — End: 2024-01-25
  Administered 2024-01-25: 500 [IU]

## 2024-01-25 MED ORDER — SODIUM CHLORIDE 0.9% FLUSH
10.0000 mL | Freq: Once | INTRAVENOUS | Status: AC
  Administered 2024-01-25: 10 mL

## 2024-01-25 MED FILL — Fosaprepitant Dimeglumine For IV Infusion 150 MG (Base Eq): INTRAVENOUS | Qty: 5 | Status: AC

## 2024-01-25 NOTE — Telephone Encounter (Signed)
 Opened in error

## 2024-01-25 NOTE — Progress Notes (Signed)
 Nutrition Assessment   Reason for Assessment: Referral (poor appetite)   ASSESSMENT: 78 year old female with stage IV NSCLC. She is receiving carboplatin/alimta + keytruda q21d. Patient is under the care of Dr. Arbutus Ped.   Past medical history includes HTN, dyspnea, tobacco use  Met with pt and son in office. Pt reports tolerating therapy well overall. She does endorse increased fatigue and taste changes. Pt reports usually enjoying cheese, however this is does not taste right currently. Son reports pt gravitating towards sweets and salty instead of vegetables. Pt states she likes broccoli and beans. Had grits and sausage bowl for breakfast. Son makes smoothies (berries, coconut water). Pt denies nausea, vomiting, diarrhea, constipation.   Nutrition Focused Physical Exam: deferred    Medications: amlodipine, D3, folic acid, zofran, compazine    Labs: Cr 1.29   Anthropometrics:   Height: 5'4" Weight: 144 lb 4.8 oz  UBW: 154 lb 12.8 oz (10/17/23) BMI: 24.47    NUTRITION DIAGNOSIS: Unintended wt loss related to side effects of treatment as evidenced by altered taste, 5% wt loss from 152 lb 8 oz on 2/6 - insignificant for time frame, however concerning given advanced age and history   INTERVENTION:  Discussed importance of calorie and protein energy intake to maintain weights/strength Educated on strategies for taste changes, suggested baking soda salt water rinses several times daily before meals Discussed strategies for making most of every bite/sip (using chx bone broth in grits, using yogurt/ice cream/ensure in smoothies Educated on foods with protein, recommend protein source at every meal Discussed strategies for poor appetite Handouts + contact information provided   MONITORING, EVALUATION, GOAL: Pt will tolerate increased calories and protein to minimize further wt loss during treatment    Next Visit: Thursday April 10 during infusion

## 2024-01-26 ENCOUNTER — Inpatient Hospital Stay

## 2024-01-26 ENCOUNTER — Inpatient Hospital Stay (HOSPITAL_BASED_OUTPATIENT_CLINIC_OR_DEPARTMENT_OTHER): Admitting: Internal Medicine

## 2024-01-26 ENCOUNTER — Other Ambulatory Visit: Payer: Self-pay

## 2024-01-26 VITALS — BP 135/89 | HR 75 | Temp 97.8°F | Resp 16 | Ht 64.0 in | Wt 145.4 lb

## 2024-01-26 DIAGNOSIS — C3490 Malignant neoplasm of unspecified part of unspecified bronchus or lung: Secondary | ICD-10-CM

## 2024-01-26 DIAGNOSIS — Z79899 Other long term (current) drug therapy: Secondary | ICD-10-CM | POA: Diagnosis not present

## 2024-01-26 DIAGNOSIS — C3491 Malignant neoplasm of unspecified part of right bronchus or lung: Secondary | ICD-10-CM | POA: Diagnosis not present

## 2024-01-26 DIAGNOSIS — C3431 Malignant neoplasm of lower lobe, right bronchus or lung: Secondary | ICD-10-CM

## 2024-01-26 DIAGNOSIS — Z5112 Encounter for antineoplastic immunotherapy: Secondary | ICD-10-CM | POA: Diagnosis not present

## 2024-01-26 DIAGNOSIS — Z87891 Personal history of nicotine dependence: Secondary | ICD-10-CM | POA: Diagnosis not present

## 2024-01-26 DIAGNOSIS — Z5111 Encounter for antineoplastic chemotherapy: Secondary | ICD-10-CM | POA: Diagnosis not present

## 2024-01-26 DIAGNOSIS — D61818 Other pancytopenia: Secondary | ICD-10-CM | POA: Diagnosis not present

## 2024-01-26 LAB — T4: T4, Total: 6.9 ug/dL (ref 4.5–12.0)

## 2024-01-26 MED ORDER — SODIUM CHLORIDE 0.9 % IV SOLN
264.0000 mg | Freq: Once | INTRAVENOUS | Status: AC
  Administered 2024-01-26: 260 mg via INTRAVENOUS
  Filled 2024-01-26: qty 26

## 2024-01-26 MED ORDER — SODIUM CHLORIDE 0.9 % IV SOLN
200.0000 mg | Freq: Once | INTRAVENOUS | Status: AC
  Administered 2024-01-26: 200 mg via INTRAVENOUS
  Filled 2024-01-26: qty 200

## 2024-01-26 MED ORDER — PALONOSETRON HCL INJECTION 0.25 MG/5ML
0.2500 mg | Freq: Once | INTRAVENOUS | Status: AC
  Administered 2024-01-26: 0.25 mg via INTRAVENOUS
  Filled 2024-01-26: qty 5

## 2024-01-26 MED ORDER — HEPARIN SOD (PORK) LOCK FLUSH 100 UNIT/ML IV SOLN
500.0000 [IU] | Freq: Once | INTRAVENOUS | Status: AC | PRN
  Administered 2024-01-26: 500 [IU]

## 2024-01-26 MED ORDER — CYANOCOBALAMIN 1000 MCG/ML IJ SOLN
1000.0000 ug | Freq: Once | INTRAMUSCULAR | Status: AC
  Administered 2024-01-26: 1000 ug via INTRAMUSCULAR
  Filled 2024-01-26: qty 1

## 2024-01-26 MED ORDER — SODIUM CHLORIDE 0.9 % IV SOLN: INTRAVENOUS | Status: DC

## 2024-01-26 MED ORDER — SODIUM CHLORIDE 0.9% FLUSH
10.0000 mL | INTRAVENOUS | Status: DC | PRN
  Administered 2024-01-26: 10 mL

## 2024-01-26 MED ORDER — DEXAMETHASONE SODIUM PHOSPHATE 10 MG/ML IJ SOLN
10.0000 mg | Freq: Once | INTRAMUSCULAR | Status: AC
  Administered 2024-01-26: 10 mg via INTRAVENOUS
  Filled 2024-01-26: qty 1

## 2024-01-26 MED ORDER — SODIUM CHLORIDE 0.9 % IV SOLN
150.0000 mg | Freq: Once | INTRAVENOUS | Status: AC
  Administered 2024-01-26: 150 mg via INTRAVENOUS
  Filled 2024-01-26: qty 150
  Filled 2024-01-26: qty 5

## 2024-01-26 MED ORDER — SODIUM CHLORIDE 0.9 % IV SOLN
400.0000 mg/m2 | Freq: Once | INTRAVENOUS | Status: AC
  Administered 2024-01-26: 700 mg via INTRAVENOUS
  Filled 2024-01-26: qty 20

## 2024-01-26 NOTE — Patient Instructions (Signed)
 [facility] - A DEPT OF [facility]  Discharge Instructions: Thank you for choosing [facility] to provide your oncology and hematology care.   If you have a lab appointment with the Cancer Center, please go directly to the Cancer Center and check in at the registration area.   Wear comfortable clothing and clothing appropriate for easy access to any Portacath or PICC line.   We strive to give you quality time with your provider. You may need to reschedule your appointment if you arrive late (15 or more minutes).  Arriving late affects you and other patients whose appointments are after yours.  Also, if you miss three or more appointments without notifying the office, you may be dismissed from the clinic at the provider's discretion.      For prescription refill requests, have your pharmacy contact our office and allow 72 hours for refills to be completed.    Today you received the following chemotherapy and/or immunotherapy agents Keytruda/Alimta/Carboplatin      To help prevent nausea and vomiting after your treatment, we encourage you to take your nausea medication as directed.  BELOW ARE SYMPTOMS THAT SHOULD BE REPORTED IMMEDIATELY: *FEVER GREATER THAN 100.4 F (38 C) OR HIGHER *CHILLS OR SWEATING *NAUSEA AND VOMITING THAT IS NOT CONTROLLED WITH YOUR NAUSEA MEDICATION *UNUSUAL SHORTNESS OF BREATH *UNUSUAL BRUISING OR BLEEDING *URINARY PROBLEMS (pain or burning when urinating, or frequent urination) *BOWEL PROBLEMS (unusual diarrhea, constipation, pain near the anus) TENDERNESS IN MOUTH AND THROAT WITH OR WITHOUT PRESENCE OF ULCERS (sore throat, sores in mouth, or a toothache) UNUSUAL RASH, SWELLING OR PAIN  UNUSUAL VAGINAL DISCHARGE OR ITCHING   Items with * indicate a potential emergency and should be followed up as soon as possible or go to the Emergency Department if any problems should occur.  Please show the CHEMOTHERAPY ALERT CARD  or IMMUNOTHERAPY ALERT CARD at check-in to the Emergency Department and triage nurse.  Should you have questions after your visit or need to cancel or reschedule your appointment, please contact [facility] - A DEPT OF [facility]  Dept: [phone]  and follow the prompts.  Office hours are 8:00 a.m. to 4:30 p.m. Monday - Friday. Please note that voicemails left after 4:00 p.m. may not be returned until the following business day.  We are closed weekends and major holidays. You have access to a nurse at all times for urgent questions. Please call the main number to the clinic Dept: [phone] and follow the prompts.   For any non-urgent questions, you may also contact your provider using MyChart. We now offer e-Visits for anyone 19 and older to request care online for non-urgent symptoms. For details visit [email or web address].   Also download the MyChart app! Go to the app store, search "MyChart", open the app, select [facility], and log in with your MyChart username and password.

## 2024-01-26 NOTE — Progress Notes (Signed)
 [facility] Telephone:(336) [phone]   Fax:([phone]  OFFICE PROGRESS NOTE  Tami Rakers, MD [address] [place] [address]  DIAGNOSIS: stage IV (T4, N2, M1 B) non-small cell lung cancer, adenocarcinoma presented with large right infrahilar mass in addition to right hilar and mediastinal lymphadenopathy as well as left adrenal gland metastasis diagnosed in December 2024.   Biomarker Findings Tumor Mutational Burden - 24 Muts/Mb HRD signature - HRDsig Negative Microsatellite status - MS-Stable Genomic Findings For a complete list of the genes assayed, please refer to the Appendix. BRAF G464V KRAS G13C ASXL1 deletion exon 12 TP53 splice site 920-2A>T 6 Disease relevant genes with no reportable alterations: ALK, EGFR, ERBB2, MET, RET, ROS1  PDL1 Expression: 90%  PRIOR THERAPY: None  CURRENT THERAPY: Systemic treatment with combination of chemoimmunotherapy with carboplatin for AUC of 5, Alimta 500 Mg/M2 and Keytruda 200 Mg IV every 3 weeks.  First dose December 08, 2023.  Status post 2 cycles.  Starting from cycle #3 her dose of carboplatin will be for AUC of 4 and Alimta 400 Mg/M2 secondary to pancytopenia.  INTERVAL HISTORY: Tami Shea 78 y.o. female returns to the clinic today for follow-up visit accompanied by her daughter. Discussed the use of AI scribe software for clinical note transcription with the patient, who gave verbal consent to proceed.  History of Present Illness   Tami Shea is a 78 year old female with stage IV non-small cell lung cancer who presents for chemotherapy treatment.  She was diagnosed with stage IV non-small cell lung cancer, adenocarcinoma type, in December 2024. The cancer is characterized by no actionable mutations and a positive PD-L1 expression of 90%. She is currently undergoing chemoimmunotherapy.  Her treatment was delayed last week due to low platelet counts, but she feels better and more energetic today.  She mentions an improvement in her eating habits, although she experiences some acidity when consuming tomatoes.  No chest pain, shortness of breath, cough, nausea, vomiting, or bleeding.       MEDICAL HISTORY: Past Medical History:  Diagnosis Date   Dyspnea    History of kidney stones    HTN (hypertension)    Smoker     ALLERGIES:  has no known allergies.  MEDICATIONS:  Current Outpatient Medications  Medication Sig Dispense Refill   amLODipine (NORVASC) 10 MG tablet 1 tablet     Cholecalciferol (VITAMIN D3) 1.25 MG (50000 UT) CAPS Take 1 capsule by mouth once a week.     folic acid (FOLVITE) 1 MG tablet Take 1 tablet (1 mg total) by mouth daily. Start 7 days before pemetrexed chemotherapy. Continue until 21 days after pemetrexed completed. 100 tablet 3   lidocaine-prilocaine (EMLA) cream Apply to affected area once 30 g 3   ondansetron (ZOFRAN) 8 MG tablet Take 1 tablet (8 mg total) by mouth every 8 (eight) hours as needed for nausea or vomiting. Start on the third Smartt after carboplatin. 30 tablet 1   prochlorperazine (COMPAZINE) 10 MG tablet Take 1 tablet (10 mg total) by mouth every 6 (six) hours as needed for nausea or vomiting. 30 tablet 1   No current facility-administered medications for this visit.    SURGICAL HISTORY:  Past Surgical History:  Procedure Laterality Date   BRONCHIAL NEEDLE ASPIRATION BIOPSY  10/24/2023   Procedure: BRONCHIAL NEEDLE ASPIRATION BIOPSIES;  Surgeon: Josephine Igo, DO;  Location: [facility];  Service: Pulmonary;;   COLONOSCOPY  IR IMAGING GUIDED PORT INSERTION  12/09/2023   PILONIDAL CYST EXCISION     tonisllectomy     VIDEO BRONCHOSCOPY WITH ENDOBRONCHIAL ULTRASOUND Right 10/24/2023   Procedure: VIDEO BRONCHOSCOPY WITH ENDOBRONCHIAL ULTRASOUND;  Surgeon: Josephine Igo, DO;  Location: [facility];  Service: Pulmonary;  Laterality: Right;    REVIEW OF SYSTEMS:  A comprehensive review of systems was negative.   PHYSICAL  EXAMINATION: General appearance: alert, cooperative, fatigued, and no distress Head: Normocephalic, without obvious abnormality, atraumatic Neck: no adenopathy, no JVD, supple, symmetrical, trachea midline, and thyroid not enlarged, symmetric, no tenderness/mass/nodules Lymph nodes: Cervical, supraclavicular, and axillary nodes normal. Resp: clear to auscultation bilaterally Back: symmetric, no curvature. ROM normal. No CVA tenderness. Cardio: regular rate and rhythm, S1, S2 normal, no murmur, click, rub or gallop GI: soft, non-tender; bowel sounds normal; no masses,  no organomegaly Extremities: extremities normal, atraumatic, no cyanosis or edema  ECOG PERFORMANCE STATUS: 1 - Symptomatic but completely ambulatory  Blood pressure 135/89, pulse 75, temperature 97.8 F (36.6 C), temperature source Temporal, resp. rate 16, height 5\' 4"  (1.626 m), weight 145 lb 6.4 oz (66 kg), SpO2 100%.  LABORATORY DATA: Lab Results  Component Value Date   WBC 8.7 01/25/2024   HGB 8.7 (L) 01/25/2024   HCT 26.8 (L) 01/25/2024   MCV 82.0 01/25/2024   PLT 286 01/25/2024      Chemistry      Component Value Date/Time   NA 141 01/25/2024 1000   NA 142 10/12/2021 1043   K 3.7 01/25/2024 1000   CL 107 01/25/2024 1000   CO2 28 01/25/2024 1000   BUN 23 01/25/2024 1000   BUN 17 10/12/2021 1043   CREATININE 1.29 (H) 01/25/2024 1000      Component Value Date/Time   CALCIUM 9.3 01/25/2024 1000   ALKPHOS 54 01/25/2024 1000   AST 18 01/25/2024 1000   ALT 7 01/25/2024 1000   BILITOT 0.5 01/25/2024 1000       RADIOGRAPHIC STUDIES: No results found.   ASSESSMENT AND PLAN: This is a very pleasant 78 years old African-American female with  stage IV (T4, N2, M1 B) non-small cell lung cancer, adenocarcinoma presented with large right infrahilar mass in addition to right hilar and mediastinal lymphadenopathy as well as left adrenal gland metastasis diagnosed in December 2024.  She had molecular studies by  [facility] that showed no actionable mutations and PD-L1 TPS of 90%. She is currently undergoing treatment with chemoimmunotherapy with carboplatin for AUC of 5, Alimta 500 Mg/M2 and Keytruda 200 Mg IV every 3 weeks for 4 cycles followed by maintenance treatment with Alimta and Keytruda.  Status post 2 cycle.  Starting from cycle #3 her dose of carboplatin was reduced to AUC of 4 and Alimta 400 Mg/M2 secondary to pancytopenia. The patient has been tolerating this treatment fairly well with no concerning adverse effects.    Non-small cell lung cancer, adenocarcinoma Wyatt Weisenburger, a 78 year old female with stage IV non-small cell lung cancer, adenocarcinoma, diagnosed in December 2024, exhibits no actionable mutations and a PD-L1 expression of 90%. Currently undergoing chemoimmunotherapy with combination of chemoimmunotherapy with carboplatin for AUC of 5, Alimta 500 Mg/M2 and Keytruda 200 Mg IV every 3 weeks.  First dose December 08, 2023.  Status post 2 cycles.  Starting from cycle #3 her dose of carboplatin will be for AUC of 4 and Alimta 400 Mg/M2 secondary to pancytopenia., her treatment was previously delayed due to thrombocytopenia, but platelet counts have recovered. She reports  improved energy levels and absence of chest pain, dyspnea, cough, nausea, vomiting, or bleeding. A CT scan of the chest, abdomen, and pelvis is scheduled in two weeks to evaluate treatment response, with clarification provided regarding its non-invasive nature to alleviate concerns about pain. - Administer cycle three of chemoimmunotherapy today, including carboplatin, pemetrexed, and pembrolizumab. - Order CT scan of the chest, abdomen, and pelvis in two weeks to assess treatment response. - Schedule follow-up visit in three weeks post-CT scan.   The patient was advised to call immediately if she has any concerning symptoms in the interval.  The patient voices understanding of current disease status and treatment options and  is in agreement with the current care plan.  All questions were answered. The patient knows to call the clinic with any problems, questions or concerns. We can certainly see the patient much sooner if necessary.  The total time spent in the appointment was 20 minutes.  Disclaimer: This note was dictated with voice recognition software. Similar sounding words can inadvertently be transcribed and may not be corrected upon review.

## 2024-02-01 ENCOUNTER — Other Ambulatory Visit: Payer: Self-pay | Admitting: Medical Oncology

## 2024-02-01 ENCOUNTER — Telehealth: Payer: Self-pay | Admitting: Medical Oncology

## 2024-02-01 ENCOUNTER — Inpatient Hospital Stay: Payer: Medicare Other

## 2024-02-01 DIAGNOSIS — C3431 Malignant neoplasm of lower lobe, right bronchus or lung: Secondary | ICD-10-CM

## 2024-02-01 DIAGNOSIS — Z87891 Personal history of nicotine dependence: Secondary | ICD-10-CM | POA: Diagnosis not present

## 2024-02-01 DIAGNOSIS — Z5111 Encounter for antineoplastic chemotherapy: Secondary | ICD-10-CM | POA: Diagnosis not present

## 2024-02-01 DIAGNOSIS — D61818 Other pancytopenia: Secondary | ICD-10-CM | POA: Diagnosis not present

## 2024-02-01 DIAGNOSIS — Z79899 Other long term (current) drug therapy: Secondary | ICD-10-CM | POA: Diagnosis not present

## 2024-02-01 DIAGNOSIS — D649 Anemia, unspecified: Secondary | ICD-10-CM

## 2024-02-01 DIAGNOSIS — Z5112 Encounter for antineoplastic immunotherapy: Secondary | ICD-10-CM | POA: Diagnosis not present

## 2024-02-01 DIAGNOSIS — C3491 Malignant neoplasm of unspecified part of right bronchus or lung: Secondary | ICD-10-CM | POA: Diagnosis not present

## 2024-02-01 LAB — CBC WITH DIFFERENTIAL (CANCER CENTER ONLY)
Abs Immature Granulocytes: 0.01 10*3/uL (ref 0.00–0.07)
Basophils Absolute: 0 10*3/uL (ref 0.0–0.1)
Basophils Relative: 0 %
Eosinophils Absolute: 0 10*3/uL (ref 0.0–0.5)
Eosinophils Relative: 0 %
HCT: 24.7 % — ABNORMAL LOW (ref 36.0–46.0)
Hemoglobin: 7.9 g/dL — ABNORMAL LOW (ref 12.0–15.0)
Immature Granulocytes: 0 %
Lymphocytes Relative: 25 %
Lymphs Abs: 1.2 10*3/uL (ref 0.7–4.0)
MCH: 26.9 pg (ref 26.0–34.0)
MCHC: 32 g/dL (ref 30.0–36.0)
MCV: 84 fL (ref 80.0–100.0)
Monocytes Absolute: 0.1 10*3/uL (ref 0.1–1.0)
Monocytes Relative: 3 %
Neutro Abs: 3.5 10*3/uL (ref 1.7–7.7)
Neutrophils Relative %: 72 %
Platelet Count: 240 10*3/uL (ref 150–400)
RBC: 2.94 MIL/uL — ABNORMAL LOW (ref 3.87–5.11)
RDW: 22.8 % — ABNORMAL HIGH (ref 11.5–15.5)
WBC Count: 4.9 10*3/uL (ref 4.0–10.5)
nRBC: 0 % (ref 0.0–0.2)

## 2024-02-01 LAB — SAMPLE TO BLOOD BANK

## 2024-02-01 LAB — PREPARE RBC (CROSSMATCH)

## 2024-02-01 NOTE — Telephone Encounter (Signed)
 Pt confirmed appt for blood transfusion tomorrow.

## 2024-02-02 ENCOUNTER — Inpatient Hospital Stay

## 2024-02-02 DIAGNOSIS — D61818 Other pancytopenia: Secondary | ICD-10-CM | POA: Diagnosis not present

## 2024-02-02 DIAGNOSIS — D649 Anemia, unspecified: Secondary | ICD-10-CM

## 2024-02-02 DIAGNOSIS — Z87891 Personal history of nicotine dependence: Secondary | ICD-10-CM | POA: Diagnosis not present

## 2024-02-02 DIAGNOSIS — Z5111 Encounter for antineoplastic chemotherapy: Secondary | ICD-10-CM | POA: Diagnosis not present

## 2024-02-02 DIAGNOSIS — Z79899 Other long term (current) drug therapy: Secondary | ICD-10-CM | POA: Diagnosis not present

## 2024-02-02 DIAGNOSIS — Z5112 Encounter for antineoplastic immunotherapy: Secondary | ICD-10-CM | POA: Diagnosis not present

## 2024-02-02 DIAGNOSIS — C3491 Malignant neoplasm of unspecified part of right bronchus or lung: Secondary | ICD-10-CM | POA: Diagnosis not present

## 2024-02-02 MED ORDER — HEPARIN SOD (PORK) LOCK FLUSH 100 UNIT/ML IV SOLN
500.0000 [IU] | Freq: Once | INTRAVENOUS | Status: AC
  Administered 2024-02-02: 500 [IU]

## 2024-02-02 MED ORDER — DIPHENHYDRAMINE HCL 25 MG PO CAPS
25.0000 mg | ORAL_CAPSULE | Freq: Once | ORAL | Status: AC
  Administered 2024-02-02: 25 mg via ORAL
  Filled 2024-02-02: qty 1

## 2024-02-02 MED ORDER — ACETAMINOPHEN 325 MG PO TABS
650.0000 mg | ORAL_TABLET | Freq: Once | ORAL | Status: AC
  Administered 2024-02-02: 650 mg via ORAL
  Filled 2024-02-02: qty 2

## 2024-02-02 MED ORDER — SODIUM CHLORIDE 0.9% IV SOLUTION
250.0000 mL | Freq: Once | INTRAVENOUS | Status: AC
Start: 2024-02-02 — End: 2024-02-02
  Administered 2024-02-02: 100 mL via INTRAVENOUS

## 2024-02-02 MED ORDER — SODIUM CHLORIDE 0.9% FLUSH
10.0000 mL | INTRAVENOUS | Status: AC | PRN
  Administered 2024-02-02: 10 mL

## 2024-02-02 NOTE — Patient Instructions (Signed)

## 2024-02-03 LAB — BPAM RBC
Blood Product Expiration Date: 202504272359
ISSUE DATE / TIME: 202503270851
Unit Type and Rh: 202504272359
Unit Type and Rh: 5100

## 2024-02-03 LAB — TYPE AND SCREEN
ABO/RH(D): O POS
Antibody Screen: NEGATIVE
Unit division: 0

## 2024-02-06 ENCOUNTER — Telehealth: Payer: Self-pay

## 2024-02-06 NOTE — Telephone Encounter (Signed)
 T/C from pt's daughter stating pt has a CT scan scheduled for 4/3 and due to increased anxiety she is asking if we can call in something for her to take before the scan

## 2024-02-08 ENCOUNTER — Other Ambulatory Visit: Payer: Medicare Other

## 2024-02-08 ENCOUNTER — Other Ambulatory Visit: Payer: Self-pay | Admitting: Physician Assistant

## 2024-02-08 ENCOUNTER — Ambulatory Visit: Payer: Medicare Other | Admitting: Internal Medicine

## 2024-02-08 ENCOUNTER — Ambulatory Visit: Payer: Medicare Other

## 2024-02-08 ENCOUNTER — Telehealth: Payer: Self-pay

## 2024-02-08 DIAGNOSIS — F40240 Claustrophobia: Secondary | ICD-10-CM

## 2024-02-08 MED ORDER — LORAZEPAM 0.5 MG PO TABS: ORAL_TABLET | ORAL | 0 refills | Status: DC

## 2024-02-08 NOTE — Telephone Encounter (Signed)
 Spoke with patients daughter Christella Hartigan in regards to medicine before scan due to anxiety.  Cassie, PA sent in Ativan 0.5 mg to pharmacy.  Informed daughter for patient to take Ativan about 45 minutes prior to scan.  Daughter verbalized understanding.

## 2024-02-09 ENCOUNTER — Inpatient Hospital Stay: Attending: Internal Medicine

## 2024-02-09 ENCOUNTER — Encounter (HOSPITAL_COMMUNITY): Payer: Self-pay

## 2024-02-09 ENCOUNTER — Other Ambulatory Visit: Payer: Self-pay | Admitting: Medical Oncology

## 2024-02-09 ENCOUNTER — Ambulatory Visit (HOSPITAL_COMMUNITY)
Admission: RE | Admit: 2024-02-09 | Discharge: 2024-02-09 | Disposition: A | Discharge status: Home / Self Care | Source: Ambulatory Visit | Attending: Internal Medicine | Admitting: Internal Medicine

## 2024-02-09 ENCOUNTER — Telehealth: Payer: Self-pay | Admitting: Medical Oncology

## 2024-02-09 DIAGNOSIS — C3490 Malignant neoplasm of unspecified part of unspecified bronchus or lung: Secondary | ICD-10-CM | POA: Insufficient documentation

## 2024-02-09 DIAGNOSIS — I700 Atherosclerosis of aorta: Secondary | ICD-10-CM | POA: Diagnosis not present

## 2024-02-09 DIAGNOSIS — N200 Calculus of kidney: Secondary | ICD-10-CM | POA: Diagnosis not present

## 2024-02-09 DIAGNOSIS — C3491 Malignant neoplasm of unspecified part of right bronchus or lung: Secondary | ICD-10-CM | POA: Insufficient documentation

## 2024-02-09 DIAGNOSIS — D649 Anemia, unspecified: Secondary | ICD-10-CM

## 2024-02-09 DIAGNOSIS — Z79899 Other long term (current) drug therapy: Secondary | ICD-10-CM | POA: Insufficient documentation

## 2024-02-09 DIAGNOSIS — Z5112 Encounter for antineoplastic immunotherapy: Secondary | ICD-10-CM | POA: Insufficient documentation

## 2024-02-09 DIAGNOSIS — D61818 Other pancytopenia: Secondary | ICD-10-CM | POA: Insufficient documentation

## 2024-02-09 DIAGNOSIS — J90 Pleural effusion, not elsewhere classified: Secondary | ICD-10-CM | POA: Diagnosis not present

## 2024-02-09 DIAGNOSIS — C3431 Malignant neoplasm of lower lobe, right bronchus or lung: Secondary | ICD-10-CM

## 2024-02-09 DIAGNOSIS — Z5111 Encounter for antineoplastic chemotherapy: Secondary | ICD-10-CM | POA: Insufficient documentation

## 2024-02-09 DIAGNOSIS — C7972 Secondary malignant neoplasm of left adrenal gland: Secondary | ICD-10-CM | POA: Insufficient documentation

## 2024-02-09 DIAGNOSIS — R918 Other nonspecific abnormal finding of lung field: Secondary | ICD-10-CM

## 2024-02-09 LAB — CMP (CANCER CENTER ONLY)
ALT: 22 U/L (ref 0–44)
AST: 27 U/L (ref 15–41)
Albumin: 3.5 g/dL (ref 3.5–5.0)
Alkaline Phosphatase: 54 U/L (ref 38–126)
Anion gap: 6 (ref 5–15)
BUN: 25 mg/dL — ABNORMAL HIGH (ref 8–23)
CO2: 30 mmol/L (ref 22–32)
Calcium: 9.2 mg/dL (ref 8.9–10.3)
Chloride: 106 mmol/L (ref 98–111)
Creatinine: 1.44 mg/dL — ABNORMAL HIGH (ref 0.44–1.00)
GFR, Estimated: 37 mL/min — ABNORMAL LOW (ref >60)
Glucose, Bld: 96 mg/dL (ref 70–99)
Potassium: 3.9 mmol/L (ref 3.5–5.1)
Sodium: 142 mmol/L (ref 135–145)
Total Bilirubin: 0.5 mg/dL (ref 0.0–1.2)
Total Protein: 7.9 g/dL (ref 6.5–8.1)

## 2024-02-09 LAB — SAMPLE TO BLOOD BANK

## 2024-02-09 LAB — CBC WITH DIFFERENTIAL (CANCER CENTER ONLY)
Abs Immature Granulocytes: 0.01 10*3/uL (ref 0.00–0.07)
Basophils Absolute: 0 10*3/uL (ref 0.0–0.1)
Basophils Relative: 0 %
Eosinophils Absolute: 0 10*3/uL (ref 0.0–0.5)
Eosinophils Relative: 1 %
HCT: 22.7 % — ABNORMAL LOW (ref 36.0–46.0)
Hemoglobin: 7.3 g/dL — ABNORMAL LOW (ref 12.0–15.0)
Immature Granulocytes: 0 %
Lymphocytes Relative: 47 %
Lymphs Abs: 1.3 10*3/uL (ref 0.7–4.0)
MCH: 27.8 pg (ref 26.0–34.0)
MCHC: 32.2 g/dL (ref 30.0–36.0)
MCV: 86.3 fL (ref 80.0–100.0)
Monocytes Absolute: 0.4 10*3/uL (ref 0.1–1.0)
Monocytes Relative: 15 %
Neutro Abs: 1 10*3/uL — ABNORMAL LOW (ref 1.7–7.7)
Neutrophils Relative %: 37 %
Platelet Count: 66 10*3/uL — ABNORMAL LOW (ref 150–400)
RBC: 2.63 MIL/uL — ABNORMAL LOW (ref 3.87–5.11)
RDW: 22.1 % — ABNORMAL HIGH (ref 11.5–15.5)
WBC Count: 2.8 10*3/uL — ABNORMAL LOW (ref 4.0–10.5)
nRBC: 0 % (ref 0.0–0.2)

## 2024-02-09 LAB — PREPARE RBC (CROSSMATCH)

## 2024-02-09 MED ORDER — IOHEXOL 300 MG/ML  SOLN
60.0000 mL | Freq: Once | INTRAMUSCULAR | Status: AC | PRN
  Administered 2024-02-09: 60 mL via INTRAVENOUS

## 2024-02-09 MED ORDER — HEPARIN SOD (PORK) LOCK FLUSH 100 UNIT/ML IV SOLN
INTRAVENOUS | Status: AC
  Filled 2024-02-09: qty 5

## 2024-02-09 MED ORDER — SODIUM CHLORIDE (PF) 0.9 % IJ SOLN
INTRAMUSCULAR | Status: AC
Start: 2024-02-09 — End: ?
  Filled 2024-02-09: qty 50

## 2024-02-09 MED ORDER — HEPARIN SOD (PORK) LOCK FLUSH 100 UNIT/ML IV SOLN
500.0000 [IU] | Freq: Once | INTRAVENOUS | Status: AC
  Administered 2024-02-09: 500 [IU] via INTRAVENOUS

## 2024-02-09 MED ORDER — SODIUM CHLORIDE 0.9% FLUSH
10.0000 mL | Freq: Once | INTRAVENOUS | Status: AC
  Administered 2024-02-09: 10 mL

## 2024-02-09 NOTE — Progress Notes (Signed)
Blood transfusion orders entered.

## 2024-02-09 NOTE — Telephone Encounter (Signed)
 Ramona notified and confirmed appt tomorrow at 0830 for pt to receive a blood transfusion. Orders entered and released.

## 2024-02-10 ENCOUNTER — Other Ambulatory Visit: Payer: Self-pay | Admitting: *Deleted

## 2024-02-10 ENCOUNTER — Inpatient Hospital Stay (HOSPITAL_BASED_OUTPATIENT_CLINIC_OR_DEPARTMENT_OTHER)

## 2024-02-10 DIAGNOSIS — Z5111 Encounter for antineoplastic chemotherapy: Secondary | ICD-10-CM | POA: Diagnosis not present

## 2024-02-10 DIAGNOSIS — C3491 Malignant neoplasm of unspecified part of right bronchus or lung: Secondary | ICD-10-CM | POA: Diagnosis not present

## 2024-02-10 DIAGNOSIS — D61818 Other pancytopenia: Secondary | ICD-10-CM | POA: Diagnosis not present

## 2024-02-10 DIAGNOSIS — D649 Anemia, unspecified: Secondary | ICD-10-CM

## 2024-02-10 DIAGNOSIS — Z5112 Encounter for antineoplastic immunotherapy: Secondary | ICD-10-CM | POA: Diagnosis not present

## 2024-02-10 DIAGNOSIS — C7972 Secondary malignant neoplasm of left adrenal gland: Secondary | ICD-10-CM | POA: Diagnosis not present

## 2024-02-10 DIAGNOSIS — Z79899 Other long term (current) drug therapy: Secondary | ICD-10-CM | POA: Diagnosis not present

## 2024-02-10 LAB — BPAM RBC
Blood Product Expiration Date: 202504152359
Unit Type and Rh: 5100

## 2024-02-10 LAB — PREPARE RBC (CROSSMATCH)

## 2024-02-10 LAB — TYPE AND SCREEN
ABO/RH(D): O POS
Antibody Screen: NEGATIVE
Unit division: 0

## 2024-02-10 MED ORDER — HEPARIN SOD (PORK) LOCK FLUSH 100 UNIT/ML IV SOLN
500.0000 [IU] | Freq: Every day | INTRAVENOUS | Status: AC | PRN
  Administered 2024-02-10: 500 [IU]

## 2024-02-10 MED ORDER — ACETAMINOPHEN 325 MG PO TABS
650.0000 mg | ORAL_TABLET | Freq: Once | ORAL | Status: AC
Start: 2024-02-10 — End: 2024-02-10
  Administered 2024-02-10: 650 mg via ORAL
  Filled 2024-02-10: qty 2

## 2024-02-10 MED ORDER — HEPARIN SOD (PORK) LOCK FLUSH 100 UNIT/ML IV SOLN: 500.0000 [IU] | Freq: Every day | INTRAVENOUS | Status: DC | PRN

## 2024-02-10 MED ORDER — SODIUM CHLORIDE 0.9% IV SOLUTION
250.0000 mL | INTRAVENOUS | Status: DC
  Administered 2024-02-10: 100 mL via INTRAVENOUS

## 2024-02-10 MED ORDER — DIPHENHYDRAMINE HCL 25 MG PO CAPS
25.0000 mg | ORAL_CAPSULE | Freq: Once | ORAL | Status: AC
Start: 2024-02-10 — End: 2024-02-10
  Administered 2024-02-10: 25 mg via ORAL
  Filled 2024-02-10: qty 1

## 2024-02-10 MED ORDER — SODIUM CHLORIDE 0.9% FLUSH: 10.0000 mL | INTRAVENOUS | Status: DC | PRN

## 2024-02-10 MED ORDER — SODIUM CHLORIDE 0.9% FLUSH
10.0000 mL | INTRAVENOUS | Status: AC | PRN
  Administered 2024-02-10: 10 mL

## 2024-02-10 NOTE — Patient Instructions (Signed)

## 2024-02-11 LAB — BPAM RBC
Blood Product Expiration Date: 202505022359
ISSUE DATE / TIME: 202504041039
Unit Type and Rh: 5100

## 2024-02-11 LAB — TYPE AND SCREEN
ABO/RH(D): O POS
Antibody Screen: NEGATIVE
Unit division: 0

## 2024-02-14 NOTE — Progress Notes (Unsigned)
 [facility] OFFICE PROGRESS NOTE  [name], MD [address] [place] [address]  DIAGNOSIS: stage IV (T4, N2, M1 B) non-small cell lung cancer, adenocarcinoma presented with large right infrahilar mass in addition to right hilar and mediastinal lymphadenopathy as well as left adrenal gland metastasis diagnosed in December 2024.    Biomarker Findings Tumor Mutational Burden - 24 Muts/Mb HRD signature - HRDsig Negative Microsatellite status - MS-Stable Genomic Findings For a complete list of the genes assayed, please refer to the Appendix. BRAF G464V KRAS G13C ASXL1 deletion exon 12 TP53 splice site 920-2A>T 6 Disease relevant genes with no reportable alterations: ALK, EGFR, ERBB2, MET, RET, ROS1   PDL1 Expression: 90%  PRIOR THERAPY: None  CURRENT THERAPY: Systemic treatment with combination of chemoimmunotherapy with carboplatin for AUC of 5, Alimta 500 Mg/M2 and Keytruda 200 Mg IV every 3 weeks. Starting from cycle #3 her dose of carboplatin will be for AUC of 4 and Alimta 400 Mg/M2 secondary to pancytopenia. First dose December 08, 2023. Status post 3 cycles.   INTERVAL HISTORY: Tami Shea 78 y.o. female returns to the clinic today for follow-up visit accompanied by ***.  The patient is currently undergoing chemotherapy and immunotherapy for her lung cancer.  In the interval since last being seen she did have anemia which required a blood transfusion.  She felt ***after having her blood transfusion.  Overall her energy is ***.  Her appetite is ***.  Blurred vision seeing ophthalmologist?  She take stool softener to help with constipation.  She denies any nausea or vomiting.  Denies any diarrhea.  Denies any fever, chills, night sweats, or unexplained weight loss.  Her breathing is good.  Denies any chest pain, shortness of breath, cough, or hemoptysis.  She recently had a restaging CT scan performed.  She is here today for evaluation repeat blood work before  undergoing cycle #4.  MEDICAL HISTORY: Past Medical History:  Diagnosis Date   Dyspnea    History of kidney stones    HTN (hypertension)    Smoker     ALLERGIES:  has no known allergies.  MEDICATIONS:  Current Outpatient Medications  Medication Sig Dispense Refill   amLODipine (NORVASC) 10 MG tablet 1 tablet     Cholecalciferol (VITAMIN D3) 1.25 MG (50000 UT) CAPS Take 1 capsule by mouth once a week.     folic acid (FOLVITE) 1 MG tablet Take 1 tablet (1 mg total) by mouth daily. Start 7 days before pemetrexed chemotherapy. Continue until 21 days after pemetrexed completed. 100 tablet 3   lidocaine-prilocaine (EMLA) cream Apply to affected area once 30 g 3   LORazepam (ATIVAN) 0.5 MG tablet Take 1 tablet by mouth 30-60 minutes before your scan 2 tablet 0   ondansetron (ZOFRAN) 8 MG tablet Take 1 tablet (8 mg total) by mouth every 8 (eight) hours as needed for nausea or vomiting. Start on the third Rosales after carboplatin. 30 tablet 1   prochlorperazine (COMPAZINE) 10 MG tablet Take 1 tablet (10 mg total) by mouth every 6 (six) hours as needed for nausea or vomiting. 30 tablet 1   No current facility-administered medications for this visit.    SURGICAL HISTORY:  Past Surgical History:  Procedure Laterality Date   BRONCHIAL NEEDLE ASPIRATION BIOPSY  10/24/2023   Procedure: BRONCHIAL NEEDLE ASPIRATION BIOPSIES;  Surgeon: Josephine Igo, DO;  Location: [facility];  Service: Pulmonary;;   COLONOSCOPY     IR IMAGING GUIDED PORT INSERTION  12/09/2023   PILONIDAL CYST EXCISION     tonisllectomy     VIDEO BRONCHOSCOPY WITH ENDOBRONCHIAL ULTRASOUND Right 10/24/2023   Procedure: VIDEO BRONCHOSCOPY WITH ENDOBRONCHIAL ULTRASOUND;  Surgeon: Josephine Igo, DO;  Location: [facility];  Service: Pulmonary;  Laterality: Right;    REVIEW OF SYSTEMS:   Review of Systems  Constitutional: Negative for appetite change, chills, fatigue, fever and unexpected weight change.  HENT:   Negative for  mouth sores, nosebleeds, sore throat and trouble swallowing.   Eyes: Negative for eye problems and icterus.  Respiratory: Negative for cough, hemoptysis, shortness of breath and wheezing.   Cardiovascular: Negative for chest pain and leg swelling.  Gastrointestinal: Negative for abdominal pain, constipation, diarrhea, nausea and vomiting.  Genitourinary: Negative for bladder incontinence, difficulty urinating, dysuria, frequency and hematuria.   Musculoskeletal: Negative for back pain, gait problem, neck pain and neck stiffness.  Skin: Negative for itching and rash.  Neurological: Negative for dizziness, extremity weakness, gait problem, headaches, light-headedness and seizures.  Hematological: Negative for adenopathy. Does not bruise/bleed easily.  Psychiatric/Behavioral: Negative for confusion, depression and sleep disturbance. The patient is not nervous/anxious.     PHYSICAL EXAMINATION:  There were no vitals taken for this visit.  ECOG PERFORMANCE STATUS: {CHL ONC ECOG [id number]  Physical Exam  Constitutional: Oriented to person, place, and time and well-developed, well-nourished, and in no distress. No distress.  HENT:  Head: Normocephalic and atraumatic.  Mouth/Throat: Oropharynx is clear and moist. No oropharyngeal exudate.  Eyes: Conjunctivae are normal. Right eye exhibits no discharge. Left eye exhibits no discharge. No scleral icterus.  Neck: Normal range of motion. Neck supple.  Cardiovascular: Normal rate, regular rhythm, normal heart sounds and intact distal pulses.   Pulmonary/Chest: Effort normal and breath sounds normal. No respiratory distress. No wheezes. No rales.  Abdominal: Soft. Bowel sounds are normal. Exhibits no distension and no mass. There is no tenderness.  Musculoskeletal: Normal range of motion. Exhibits no edema.  Lymphadenopathy:    No cervical adenopathy.  Neurological: Alert and oriented to person, place, and time. Exhibits normal muscle tone.  Gait normal. Coordination normal.  Skin: Skin is warm and dry. No rash noted. Not diaphoretic. No erythema. No pallor.  Psychiatric: Mood, memory and judgment normal.  Vitals reviewed.  LABORATORY DATA: Lab Results  Component Value Date   WBC 2.8 (L) 02/09/2024   HGB 7.3 (L) 02/09/2024   HCT 22.7 (L) 02/09/2024   MCV 86.3 02/09/2024   PLT 66 (L) 02/09/2024      Chemistry      Component Value Date/Time   NA 142 02/09/2024 1126   NA 142 10/12/2021 1043   K 3.9 02/09/2024 1126   CL 106 02/09/2024 1126   CO2 30 02/09/2024 1126   BUN 25 (H) 02/09/2024 1126   BUN 17 10/12/2021 1043   CREATININE 1.44 (H) 02/09/2024 1126      Component Value Date/Time   CALCIUM 9.2 02/09/2024 1126   ALKPHOS 54 02/09/2024 1126   AST 27 02/09/2024 1126   ALT 22 02/09/2024 1126   BILITOT 0.5 02/09/2024 1126       RADIOGRAPHIC STUDIES:  No results found.   ASSESSMENT/PLAN:  This is a very pleasant 78 year old African-American female with stage IV (T4, N2, M1 B) non-small cell lung cancer, adenocarcinoma.  She presented with a large right infrahilar mass in addition to right hilar and mediastinal lymphadenopathy as well as a left adrenal metastasis.  She was diagnosed in December 2024.  She had molecular  studies performed by [facility] that showed no actionable mutations.  Her PD-L1 expression is 90%.   The patient is currently undergoing chemotherapy and immunotherapy with carboplatin for an AUC of 5, Alimta 500 mg/m, Keytruda 200 mg IV every 3 weeks.  She is status post 3 cycles.  Starting from cycle #3, Dr. Arbutus Ped reduce the dose of her chemotherapy due to pancytopenia.  The patient recently had a restaging CT scan.  The patient was seen with Dr. Arbutus Ped today.  Dr. Arbutus Ped personally and independently reviewed the scan and discussed the results with the patient today.  The scan showed ***  Labs were reviewed. *** recommend she *** with cycle #4 today as scheduled.   Will continue to  monitor her labs closely on a weekly basis.  Will need weekly labs to monitor this.     We will see her back for follow-up visit in 3 weeks for evaluation and repeat blood work before undergoing cycle #5    Does also appear to be having some worsening anemia.  I will arrange for weekly labs.  I will also arrange for ABO to be drawn next week and have weekly sample blood bank's.  I encouraged that she take an iron supplement.  We would consider her for blood transfusion if her hemoglobin were less than 8.   The patient was advised to call immediately if she has any concerning symptoms in the interval. The patient voices understanding of current disease status and treatment options and is in agreement with the current care plan. All questions were answered. The patient knows to call the clinic with any problems, questions or concerns. We can certainly see the patient much sooner if necessary      No orders of the defined types were placed in this encounter.    I spent {CHL ONC TIME VISIT - WUJWJ:[id number]} counseling the patient face to face. The total time spent in the appointment was {CHL ONC TIME VISIT - OZHYQ:[id number]}.  Kaspar Albornoz L Hendrix Yurkovich, PA-C 02/14/24

## 2024-02-15 ENCOUNTER — Other Ambulatory Visit: Payer: Self-pay | Admitting: Physician Assistant

## 2024-02-15 ENCOUNTER — Other Ambulatory Visit

## 2024-02-15 ENCOUNTER — Ambulatory Visit

## 2024-02-15 ENCOUNTER — Ambulatory Visit: Admitting: Physician Assistant

## 2024-02-15 ENCOUNTER — Inpatient Hospital Stay: Payer: Medicare Other

## 2024-02-15 DIAGNOSIS — C3431 Malignant neoplasm of lower lobe, right bronchus or lung: Secondary | ICD-10-CM

## 2024-02-15 MED FILL — Fosaprepitant Dimeglumine For IV Infusion 150 MG (Base Eq): INTRAVENOUS | Qty: 5 | Status: AC

## 2024-02-16 ENCOUNTER — Inpatient Hospital Stay

## 2024-02-16 ENCOUNTER — Inpatient Hospital Stay (HOSPITAL_BASED_OUTPATIENT_CLINIC_OR_DEPARTMENT_OTHER): Admitting: Physician Assistant

## 2024-02-16 ENCOUNTER — Inpatient Hospital Stay: Admitting: Dietician

## 2024-02-16 ENCOUNTER — Other Ambulatory Visit: Payer: Self-pay

## 2024-02-16 VITALS — BP 142/74 | HR 68 | Temp 97.4°F | Resp 13 | Wt 148.4 lb

## 2024-02-16 DIAGNOSIS — D649 Anemia, unspecified: Secondary | ICD-10-CM

## 2024-02-16 DIAGNOSIS — Z5112 Encounter for antineoplastic immunotherapy: Secondary | ICD-10-CM | POA: Diagnosis not present

## 2024-02-16 DIAGNOSIS — D61818 Other pancytopenia: Secondary | ICD-10-CM | POA: Diagnosis not present

## 2024-02-16 DIAGNOSIS — Z5111 Encounter for antineoplastic chemotherapy: Secondary | ICD-10-CM

## 2024-02-16 DIAGNOSIS — C3431 Malignant neoplasm of lower lobe, right bronchus or lung: Secondary | ICD-10-CM

## 2024-02-16 DIAGNOSIS — C3491 Malignant neoplasm of unspecified part of right bronchus or lung: Secondary | ICD-10-CM | POA: Diagnosis not present

## 2024-02-16 DIAGNOSIS — Z79899 Other long term (current) drug therapy: Secondary | ICD-10-CM | POA: Diagnosis not present

## 2024-02-16 DIAGNOSIS — C7972 Secondary malignant neoplasm of left adrenal gland: Secondary | ICD-10-CM | POA: Diagnosis not present

## 2024-02-16 DIAGNOSIS — R918 Other nonspecific abnormal finding of lung field: Secondary | ICD-10-CM

## 2024-02-16 LAB — CBC WITH DIFFERENTIAL (CANCER CENTER ONLY)
Abs Immature Granulocytes: 0.02 10*3/uL (ref 0.00–0.07)
Basophils Absolute: 0 10*3/uL (ref 0.0–0.1)
Basophils Relative: 0 %
Eosinophils Absolute: 0 10*3/uL (ref 0.0–0.5)
Eosinophils Relative: 1 %
HCT: 26.1 % — ABNORMAL LOW (ref 36.0–46.0)
Hemoglobin: 8.5 g/dL — ABNORMAL LOW (ref 12.0–15.0)
Immature Granulocytes: 1 %
Lymphocytes Relative: 31 %
Lymphs Abs: 1.4 10*3/uL (ref 0.7–4.0)
MCH: 28.9 pg (ref 26.0–34.0)
MCHC: 32.6 g/dL (ref 30.0–36.0)
MCV: 88.8 fL (ref 80.0–100.0)
Monocytes Absolute: 0.8 10*3/uL (ref 0.1–1.0)
Monocytes Relative: 18 %
Neutro Abs: 2.2 10*3/uL (ref 1.7–7.7)
Neutrophils Relative %: 49 %
Platelet Count: 161 10*3/uL (ref 150–400)
RBC: 2.94 MIL/uL — ABNORMAL LOW (ref 3.87–5.11)
RDW: 23.7 % — ABNORMAL HIGH (ref 11.5–15.5)
WBC Count: 4.4 10*3/uL (ref 4.0–10.5)
nRBC: 0 % (ref 0.0–0.2)

## 2024-02-16 LAB — SAMPLE TO BLOOD BANK

## 2024-02-16 LAB — CMP (CANCER CENTER ONLY)
ALT: 19 U/L (ref 0–44)
AST: 26 U/L (ref 15–41)
Albumin: 3.5 g/dL (ref 3.5–5.0)
Alkaline Phosphatase: 53 U/L (ref 38–126)
Anion gap: 7 (ref 5–15)
BUN: 22 mg/dL (ref 8–23)
CO2: 28 mmol/L (ref 22–32)
Calcium: 9 mg/dL (ref 8.9–10.3)
Chloride: 105 mmol/L (ref 98–111)
Creatinine: 1.35 mg/dL — ABNORMAL HIGH (ref 0.44–1.00)
GFR, Estimated: 40 mL/min — ABNORMAL LOW (ref >60)
Glucose, Bld: 87 mg/dL (ref 70–99)
Potassium: 4.1 mmol/L (ref 3.5–5.1)
Sodium: 140 mmol/L (ref 135–145)
Total Bilirubin: 0.4 mg/dL (ref 0.0–1.2)
Total Protein: 8 g/dL (ref 6.5–8.1)

## 2024-02-16 MED ORDER — SODIUM CHLORIDE 0.9 % IV SOLN
150.0000 mg | Freq: Once | INTRAVENOUS | Status: AC
  Administered 2024-02-16: 150 mg via INTRAVENOUS
  Filled 2024-02-16: qty 150

## 2024-02-16 MED ORDER — SODIUM CHLORIDE 0.9 % IV SOLN: INTRAVENOUS | Status: DC

## 2024-02-16 MED ORDER — PALONOSETRON HCL INJECTION 0.25 MG/5ML
0.2500 mg | Freq: Once | INTRAVENOUS | Status: AC
Start: 2024-02-16 — End: 2024-02-16
  Administered 2024-02-16: 0.25 mg via INTRAVENOUS
  Filled 2024-02-16: qty 5

## 2024-02-16 MED ORDER — SODIUM CHLORIDE 0.9 % IV SOLN
200.0000 mg | Freq: Once | INTRAVENOUS | Status: AC
  Administered 2024-02-16: 200 mg via INTRAVENOUS
  Filled 2024-02-16: qty 200

## 2024-02-16 MED ORDER — SODIUM CHLORIDE 0.9% FLUSH
10.0000 mL | Freq: Once | INTRAVENOUS | Status: AC
  Administered 2024-02-16: 10 mL

## 2024-02-16 MED ORDER — CARBOPLATIN CHEMO INJECTION 450 MG/45ML
264.0000 mg | Freq: Once | INTRAVENOUS | Status: AC
  Administered 2024-02-16: 260 mg via INTRAVENOUS
  Filled 2024-02-16: qty 26

## 2024-02-16 MED ORDER — DEXAMETHASONE SODIUM PHOSPHATE 10 MG/ML IJ SOLN
10.0000 mg | Freq: Once | INTRAMUSCULAR | Status: AC
  Administered 2024-02-16: 10 mg via INTRAVENOUS
  Filled 2024-02-16: qty 1

## 2024-02-16 MED ORDER — SODIUM CHLORIDE 0.9 % IV SOLN
400.0000 mg/m2 | Freq: Once | INTRAVENOUS | Status: AC
  Administered 2024-02-16: 700 mg via INTRAVENOUS
  Filled 2024-02-16: qty 20

## 2024-02-16 NOTE — Patient Instructions (Signed)
 [facility] - A DEPT OF [facility]  Discharge Instructions: Thank you for choosing [facility] to provide your oncology and hematology care.   If you have a lab appointment with the Cancer Center, please go directly to the Cancer Center and check in at the registration area.   Wear comfortable clothing and clothing appropriate for easy access to any Portacath or PICC line.   We strive to give you quality time with your provider. You may need to reschedule your appointment if you arrive late (15 or more minutes).  Arriving late affects you and other patients whose appointments are after yours.  Also, if you miss three or more appointments without notifying the office, you may be dismissed from the clinic at the provider's discretion.      For prescription refill requests, have your pharmacy contact our office and allow 72 hours for refills to be completed.    Today you received the following chemotherapy and/or immunotherapy agents Keytruda/Alimta/Carboplatin      To help prevent nausea and vomiting after your treatment, we encourage you to take your nausea medication as directed.  BELOW ARE SYMPTOMS THAT SHOULD BE REPORTED IMMEDIATELY: *FEVER GREATER THAN 100.4 F (38 C) OR HIGHER *CHILLS OR SWEATING *NAUSEA AND VOMITING THAT IS NOT CONTROLLED WITH YOUR NAUSEA MEDICATION *UNUSUAL SHORTNESS OF BREATH *UNUSUAL BRUISING OR BLEEDING *URINARY PROBLEMS (pain or burning when urinating, or frequent urination) *BOWEL PROBLEMS (unusual diarrhea, constipation, pain near the anus) TENDERNESS IN MOUTH AND THROAT WITH OR WITHOUT PRESENCE OF ULCERS (sore throat, sores in mouth, or a toothache) UNUSUAL RASH, SWELLING OR PAIN  UNUSUAL VAGINAL DISCHARGE OR ITCHING   Items with * indicate a potential emergency and should be followed up as soon as possible or go to the Emergency Department if any problems should occur.  Please show the CHEMOTHERAPY ALERT CARD  or IMMUNOTHERAPY ALERT CARD at check-in to the Emergency Department and triage nurse.  Should you have questions after your visit or need to cancel or reschedule your appointment, please contact [facility] - A DEPT OF [facility]  Dept: [phone]  and follow the prompts.  Office hours are 8:00 a.m. to 4:30 p.m. Monday - Friday. Please note that voicemails left after 4:00 p.m. may not be returned until the following business day.  We are closed weekends and major holidays. You have access to a nurse at all times for urgent questions. Please call the main number to the clinic Dept: [phone] and follow the prompts.   For any non-urgent questions, you may also contact your provider using MyChart. We now offer e-Visits for anyone 19 and older to request care online for non-urgent symptoms. For details visit [email or web address].   Also download the MyChart app! Go to the app store, search "MyChart", open the app, select [facility], and log in with your MyChart username and password.

## 2024-02-22 ENCOUNTER — Encounter: Payer: Self-pay | Admitting: Internal Medicine

## 2024-02-22 ENCOUNTER — Inpatient Hospital Stay: Payer: Medicare Other

## 2024-02-22 VITALS — BP 142/87 | HR 72 | Temp 98.5°F | Resp 16

## 2024-02-22 DIAGNOSIS — C3491 Malignant neoplasm of unspecified part of right bronchus or lung: Secondary | ICD-10-CM | POA: Diagnosis not present

## 2024-02-22 DIAGNOSIS — D61818 Other pancytopenia: Secondary | ICD-10-CM | POA: Diagnosis not present

## 2024-02-22 DIAGNOSIS — C7972 Secondary malignant neoplasm of left adrenal gland: Secondary | ICD-10-CM | POA: Diagnosis not present

## 2024-02-22 DIAGNOSIS — Z79899 Other long term (current) drug therapy: Secondary | ICD-10-CM | POA: Diagnosis not present

## 2024-02-22 DIAGNOSIS — R918 Other nonspecific abnormal finding of lung field: Secondary | ICD-10-CM

## 2024-02-22 DIAGNOSIS — D649 Anemia, unspecified: Secondary | ICD-10-CM

## 2024-02-22 DIAGNOSIS — Z5112 Encounter for antineoplastic immunotherapy: Secondary | ICD-10-CM | POA: Diagnosis not present

## 2024-02-22 DIAGNOSIS — C3431 Malignant neoplasm of lower lobe, right bronchus or lung: Secondary | ICD-10-CM

## 2024-02-22 DIAGNOSIS — Z5111 Encounter for antineoplastic chemotherapy: Secondary | ICD-10-CM | POA: Diagnosis not present

## 2024-02-22 LAB — CMP (CANCER CENTER ONLY)
ALT: 19 U/L (ref 0–44)
AST: 26 U/L (ref 15–41)
Albumin: 3.4 g/dL — ABNORMAL LOW (ref 3.5–5.0)
Alkaline Phosphatase: 54 U/L (ref 38–126)
Anion gap: 4 — ABNORMAL LOW (ref 5–15)
BUN: 23 mg/dL (ref 8–23)
CO2: 29 mmol/L (ref 22–32)
Calcium: 9 mg/dL (ref 8.9–10.3)
Chloride: 107 mmol/L (ref 98–111)
Creatinine: 1.29 mg/dL — ABNORMAL HIGH (ref 0.44–1.00)
GFR, Estimated: 43 mL/min — ABNORMAL LOW (ref >60)
Glucose, Bld: 100 mg/dL — ABNORMAL HIGH (ref 70–99)
Potassium: 4 mmol/L (ref 3.5–5.1)
Sodium: 140 mmol/L (ref 135–145)
Total Bilirubin: 0.8 mg/dL (ref 0.0–1.2)
Total Protein: 7.7 g/dL (ref 6.5–8.1)

## 2024-02-22 LAB — CBC WITH DIFFERENTIAL (CANCER CENTER ONLY)
Abs Immature Granulocytes: 0.02 10*3/uL (ref 0.00–0.07)
Basophils Absolute: 0 10*3/uL (ref 0.0–0.1)
Basophils Relative: 0 %
Eosinophils Absolute: 0 10*3/uL (ref 0.0–0.5)
Eosinophils Relative: 0 %
HCT: 24.6 % — ABNORMAL LOW (ref 36.0–46.0)
Hemoglobin: 8.1 g/dL — ABNORMAL LOW (ref 12.0–15.0)
Immature Granulocytes: 1 %
Lymphocytes Relative: 28 %
Lymphs Abs: 1.1 10*3/uL (ref 0.7–4.0)
MCH: 29.8 pg (ref 26.0–34.0)
MCHC: 32.9 g/dL (ref 30.0–36.0)
MCV: 90.4 fL (ref 80.0–100.0)
Monocytes Absolute: 0.1 10*3/uL (ref 0.1–1.0)
Monocytes Relative: 2 %
Neutro Abs: 2.9 10*3/uL (ref 1.7–7.7)
Neutrophils Relative %: 69 %
Platelet Count: 175 10*3/uL (ref 150–400)
RBC: 2.72 MIL/uL — ABNORMAL LOW (ref 3.87–5.11)
RDW: 23.7 % — ABNORMAL HIGH (ref 11.5–15.5)
WBC Count: 4.1 10*3/uL (ref 4.0–10.5)
nRBC: 0 % (ref 0.0–0.2)

## 2024-02-22 LAB — SAMPLE TO BLOOD BANK

## 2024-02-22 MED ORDER — SODIUM CHLORIDE 0.9% FLUSH
10.0000 mL | Freq: Once | INTRAVENOUS | Status: AC
  Administered 2024-02-22: 10 mL

## 2024-02-22 MED ORDER — HEPARIN SOD (PORK) LOCK FLUSH 100 UNIT/ML IV SOLN
500.0000 [IU] | Freq: Once | INTRAVENOUS | Status: AC
Start: 2024-02-22 — End: 2024-02-22
  Administered 2024-02-22: 500 [IU]

## 2024-02-28 ENCOUNTER — Other Ambulatory Visit (HOSPITAL_BASED_OUTPATIENT_CLINIC_OR_DEPARTMENT_OTHER): Payer: Self-pay

## 2024-02-29 ENCOUNTER — Inpatient Hospital Stay

## 2024-02-29 ENCOUNTER — Other Ambulatory Visit: Payer: Medicare Other

## 2024-02-29 ENCOUNTER — Encounter: Payer: Self-pay | Admitting: Internal Medicine

## 2024-02-29 ENCOUNTER — Other Ambulatory Visit: Payer: Self-pay

## 2024-02-29 ENCOUNTER — Ambulatory Visit: Payer: Medicare Other

## 2024-02-29 ENCOUNTER — Ambulatory Visit: Payer: Medicare Other | Admitting: Physician Assistant

## 2024-02-29 VITALS — BP 116/82 | HR 80 | Temp 98.6°F | Resp 16

## 2024-02-29 DIAGNOSIS — Z5112 Encounter for antineoplastic immunotherapy: Secondary | ICD-10-CM | POA: Diagnosis not present

## 2024-02-29 DIAGNOSIS — Z79899 Other long term (current) drug therapy: Secondary | ICD-10-CM | POA: Diagnosis not present

## 2024-02-29 DIAGNOSIS — R918 Other nonspecific abnormal finding of lung field: Secondary | ICD-10-CM

## 2024-02-29 DIAGNOSIS — D649 Anemia, unspecified: Secondary | ICD-10-CM

## 2024-02-29 DIAGNOSIS — C3491 Malignant neoplasm of unspecified part of right bronchus or lung: Secondary | ICD-10-CM | POA: Diagnosis not present

## 2024-02-29 DIAGNOSIS — C3431 Malignant neoplasm of lower lobe, right bronchus or lung: Secondary | ICD-10-CM

## 2024-02-29 DIAGNOSIS — C7972 Secondary malignant neoplasm of left adrenal gland: Secondary | ICD-10-CM | POA: Diagnosis not present

## 2024-02-29 DIAGNOSIS — D61818 Other pancytopenia: Secondary | ICD-10-CM | POA: Diagnosis not present

## 2024-02-29 DIAGNOSIS — Z5111 Encounter for antineoplastic chemotherapy: Secondary | ICD-10-CM | POA: Diagnosis not present

## 2024-02-29 LAB — CMP (CANCER CENTER ONLY)
ALT: 17 U/L (ref 0–44)
AST: 24 U/L (ref 15–41)
Albumin: 3.4 g/dL — ABNORMAL LOW (ref 3.5–5.0)
Alkaline Phosphatase: 57 U/L (ref 38–126)
Anion gap: 6 (ref 5–15)
BUN: 26 mg/dL — ABNORMAL HIGH (ref 8–23)
CO2: 27 mmol/L (ref 22–32)
Calcium: 9 mg/dL (ref 8.9–10.3)
Chloride: 108 mmol/L (ref 98–111)
Creatinine: 1.39 mg/dL — ABNORMAL HIGH (ref 0.44–1.00)
GFR, Estimated: 39 mL/min — ABNORMAL LOW (ref >60)
Glucose, Bld: 105 mg/dL — ABNORMAL HIGH (ref 70–99)
Potassium: 3.7 mmol/L (ref 3.5–5.1)
Sodium: 141 mmol/L (ref 135–145)
Total Bilirubin: 0.6 mg/dL (ref 0.0–1.2)
Total Protein: 7.6 g/dL (ref 6.5–8.1)

## 2024-02-29 LAB — SAMPLE TO BLOOD BANK

## 2024-02-29 LAB — CBC WITH DIFFERENTIAL (CANCER CENTER ONLY)
Abs Immature Granulocytes: 0 10*3/uL (ref 0.00–0.07)
Basophils Absolute: 0 10*3/uL (ref 0.0–0.1)
Basophils Relative: 0 %
Eosinophils Absolute: 0 10*3/uL (ref 0.0–0.5)
Eosinophils Relative: 0 %
HCT: 20.4 % — ABNORMAL LOW (ref 36.0–46.0)
Hemoglobin: 6.6 g/dL — CL (ref 12.0–15.0)
Immature Granulocytes: 0 %
Lymphocytes Relative: 69 %
Lymphs Abs: 1.4 10*3/uL (ref 0.7–4.0)
MCH: 29.6 pg (ref 26.0–34.0)
MCHC: 32.4 g/dL (ref 30.0–36.0)
MCV: 91.5 fL (ref 80.0–100.0)
Monocytes Absolute: 0.2 10*3/uL (ref 0.1–1.0)
Monocytes Relative: 11 %
Neutro Abs: 0.4 10*3/uL — CL (ref 1.7–7.7)
Neutrophils Relative %: 20 %
Platelet Count: 26 10*3/uL — ABNORMAL LOW (ref 150–400)
RBC: 2.23 MIL/uL — ABNORMAL LOW (ref 3.87–5.11)
RDW: 21.9 % — ABNORMAL HIGH (ref 11.5–15.5)
Smear Review: NORMAL
WBC Count: 2 10*3/uL — ABNORMAL LOW (ref 4.0–10.5)
nRBC: 0 % (ref 0.0–0.2)

## 2024-02-29 LAB — PREPARE RBC (CROSSMATCH)

## 2024-02-29 MED ORDER — SODIUM CHLORIDE 0.9% FLUSH
10.0000 mL | Freq: Once | INTRAVENOUS | Status: AC
Start: 2024-02-29 — End: 2024-02-29
  Administered 2024-02-29: 10 mL

## 2024-02-29 MED ORDER — HEPARIN SOD (PORK) LOCK FLUSH 100 UNIT/ML IV SOLN
500.0000 [IU] | Freq: Once | INTRAVENOUS | Status: AC
  Administered 2024-02-29: 500 [IU]

## 2024-02-29 MED ORDER — FILGRASTIM-SNDZ 300 MCG/0.5ML IJ SOSY
300.0000 ug | PREFILLED_SYRINGE | Freq: Once | INTRAMUSCULAR | Status: AC
Start: 2024-02-29 — End: 2024-02-29
  Administered 2024-02-29: 300 ug via SUBCUTANEOUS
  Filled 2024-02-29: qty 0.5

## 2024-02-29 NOTE — Progress Notes (Signed)
 Blood orders entered and confirmed by blood bank.   Faxed blood bank pick up slip to [facility] and [facility] with confirmation.

## 2024-02-29 NOTE — Progress Notes (Signed)
 CRITICAL VALUE STICKER  CRITICAL VALUE: hgb 6.6, ANC 0.4  RECEIVER (on-site recipient of call): [facility]   DATE & TIME NOTIFIED: 4/23 @ 1125  MESSENGER (representative from lab): Amber  MD NOTIFIED: Dr, Marguerita Shih   TIME OF NOTIFICATION: 1210  RESPONSE:Patient needs 2 units of blood and x3 days of Zarxio .

## 2024-03-01 ENCOUNTER — Encounter: Payer: Self-pay | Admitting: *Deleted

## 2024-03-01 ENCOUNTER — Inpatient Hospital Stay

## 2024-03-01 ENCOUNTER — Ambulatory Visit: Admitting: Physician Assistant

## 2024-03-01 ENCOUNTER — Ambulatory Visit

## 2024-03-01 ENCOUNTER — Telehealth: Payer: Self-pay

## 2024-03-01 ENCOUNTER — Other Ambulatory Visit

## 2024-03-01 DIAGNOSIS — Z5112 Encounter for antineoplastic immunotherapy: Secondary | ICD-10-CM | POA: Diagnosis not present

## 2024-03-01 DIAGNOSIS — C7972 Secondary malignant neoplasm of left adrenal gland: Secondary | ICD-10-CM | POA: Diagnosis not present

## 2024-03-01 DIAGNOSIS — D61818 Other pancytopenia: Secondary | ICD-10-CM | POA: Diagnosis not present

## 2024-03-01 DIAGNOSIS — R918 Other nonspecific abnormal finding of lung field: Secondary | ICD-10-CM

## 2024-03-01 DIAGNOSIS — Z5111 Encounter for antineoplastic chemotherapy: Secondary | ICD-10-CM | POA: Diagnosis not present

## 2024-03-01 DIAGNOSIS — C3491 Malignant neoplasm of unspecified part of right bronchus or lung: Secondary | ICD-10-CM | POA: Diagnosis not present

## 2024-03-01 DIAGNOSIS — Z79899 Other long term (current) drug therapy: Secondary | ICD-10-CM | POA: Diagnosis not present

## 2024-03-01 DIAGNOSIS — C3431 Malignant neoplasm of lower lobe, right bronchus or lung: Secondary | ICD-10-CM

## 2024-03-01 MED ORDER — FILGRASTIM-SNDZ 300 MCG/0.5ML IJ SOSY
300.0000 ug | PREFILLED_SYRINGE | Freq: Once | INTRAMUSCULAR | Status: AC
  Administered 2024-03-01: 300 ug via SUBCUTANEOUS
  Filled 2024-03-01: qty 0.5

## 2024-03-01 NOTE — Progress Notes (Signed)
 Scheduled at [facility] for 2 units blood on 4/25 at 0830. Blood ticket faxed to [facility] blood bank and [facility] stat pick up arranged for 0730 tomorrow. Confirmed that orders are in.

## 2024-03-01 NOTE — Telephone Encounter (Signed)
 Spoke with patients daughter Ramona in regards to appt today.  She requested appt to be sooner if possible.  Informed her appt was moved to 2:15.  She voiced understanding.

## 2024-03-02 ENCOUNTER — Encounter: Payer: Self-pay | Admitting: Internal Medicine

## 2024-03-02 ENCOUNTER — Inpatient Hospital Stay

## 2024-03-02 ENCOUNTER — Telehealth: Payer: Self-pay

## 2024-03-02 VITALS — BP 112/83 | HR 68 | Temp 98.1°F | Resp 16

## 2024-03-02 DIAGNOSIS — C7972 Secondary malignant neoplasm of left adrenal gland: Secondary | ICD-10-CM | POA: Diagnosis not present

## 2024-03-02 DIAGNOSIS — D61818 Other pancytopenia: Secondary | ICD-10-CM | POA: Diagnosis not present

## 2024-03-02 DIAGNOSIS — Z95828 Presence of other vascular implants and grafts: Secondary | ICD-10-CM

## 2024-03-02 DIAGNOSIS — C3491 Malignant neoplasm of unspecified part of right bronchus or lung: Secondary | ICD-10-CM | POA: Diagnosis not present

## 2024-03-02 DIAGNOSIS — Z79899 Other long term (current) drug therapy: Secondary | ICD-10-CM | POA: Diagnosis not present

## 2024-03-02 DIAGNOSIS — D649 Anemia, unspecified: Secondary | ICD-10-CM

## 2024-03-02 DIAGNOSIS — Z5111 Encounter for antineoplastic chemotherapy: Secondary | ICD-10-CM | POA: Diagnosis not present

## 2024-03-02 DIAGNOSIS — Z5112 Encounter for antineoplastic immunotherapy: Secondary | ICD-10-CM | POA: Diagnosis not present

## 2024-03-02 MED ORDER — SODIUM CHLORIDE 0.9% FLUSH
10.0000 mL | Freq: Once | INTRAVENOUS | Status: AC
  Administered 2024-03-02: 10 mL via INTRAVENOUS

## 2024-03-02 MED ORDER — HEPARIN SOD (PORK) LOCK FLUSH 100 UNIT/ML IV SOLN
500.0000 [IU] | Freq: Once | INTRAVENOUS | Status: AC
  Administered 2024-03-02: 500 [IU] via INTRAVENOUS

## 2024-03-02 MED ORDER — DIPHENHYDRAMINE HCL 25 MG PO CAPS
25.0000 mg | ORAL_CAPSULE | Freq: Once | ORAL | Status: AC
  Administered 2024-03-02: 25 mg via ORAL
  Filled 2024-03-02: qty 1

## 2024-03-02 MED ORDER — ACETAMINOPHEN 325 MG PO TABS
650.0000 mg | ORAL_TABLET | Freq: Once | ORAL | Status: AC
  Administered 2024-03-02: 650 mg via ORAL
  Filled 2024-03-02: qty 2

## 2024-03-02 MED ORDER — SODIUM CHLORIDE 0.9% IV SOLUTION
250.0000 mL | INTRAVENOUS | Status: DC
  Administered 2024-03-02: 100 mL via INTRAVENOUS

## 2024-03-02 NOTE — Telephone Encounter (Signed)
 Patient's Zarxio  300 mcg injection administration was missed today. Telephone call to patient/daughter and got this scheduled at [facility] on Saturday 04/26 at 1200. Patient's daughter aware and agreeable.

## 2024-03-02 NOTE — Patient Instructions (Signed)

## 2024-03-03 ENCOUNTER — Inpatient Hospital Stay

## 2024-03-03 VITALS — BP 135/82 | HR 79 | Temp 98.6°F | Resp 16

## 2024-03-03 DIAGNOSIS — C3431 Malignant neoplasm of lower lobe, right bronchus or lung: Secondary | ICD-10-CM

## 2024-03-03 DIAGNOSIS — Z79899 Other long term (current) drug therapy: Secondary | ICD-10-CM | POA: Diagnosis not present

## 2024-03-03 DIAGNOSIS — R918 Other nonspecific abnormal finding of lung field: Secondary | ICD-10-CM

## 2024-03-03 DIAGNOSIS — C3491 Malignant neoplasm of unspecified part of right bronchus or lung: Secondary | ICD-10-CM | POA: Diagnosis not present

## 2024-03-03 DIAGNOSIS — Z5112 Encounter for antineoplastic immunotherapy: Secondary | ICD-10-CM | POA: Diagnosis not present

## 2024-03-03 DIAGNOSIS — C7972 Secondary malignant neoplasm of left adrenal gland: Secondary | ICD-10-CM | POA: Diagnosis not present

## 2024-03-03 DIAGNOSIS — D61818 Other pancytopenia: Secondary | ICD-10-CM | POA: Diagnosis not present

## 2024-03-03 DIAGNOSIS — Z5111 Encounter for antineoplastic chemotherapy: Secondary | ICD-10-CM | POA: Diagnosis not present

## 2024-03-03 MED ORDER — FILGRASTIM-SNDZ 300 MCG/0.5ML IJ SOSY
300.0000 ug | PREFILLED_SYRINGE | Freq: Once | INTRAMUSCULAR | Status: AC
  Administered 2024-03-03: 300 ug via SUBCUTANEOUS

## 2024-03-05 ENCOUNTER — Inpatient Hospital Stay

## 2024-03-05 LAB — TYPE AND SCREEN
ABO/RH(D): O POS
Antibody Screen: NEGATIVE
Unit division: 0
Unit division: 0

## 2024-03-05 LAB — BPAM RBC
Blood Product Expiration Date: 202505262359
Blood Product Expiration Date: 202505262359
ISSUE DATE / TIME: 202504250714
ISSUE DATE / TIME: 202504250714
Unit Type and Rh: 5100
Unit Type and Rh: 5100

## 2024-03-05 NOTE — Progress Notes (Signed)
 [facility] OFFICE PROGRESS NOTE  [name], MD [address] [place] [address]  DIAGNOSIS: stage IV (T4, N2, M1 B) non-small cell lung cancer, adenocarcinoma presented with large right infrahilar mass in addition to right hilar and mediastinal lymphadenopathy as well as left adrenal gland metastasis diagnosed in December 2024.    Biomarker Findings Tumor Mutational Burden - 24 Muts/Mb HRD signature - HRDsig Negative Microsatellite status - MS-Stable Genomic Findings For a complete list of the genes assayed, please refer to the Appendix. BRAF G464V KRAS G13C ASXL1 deletion exon 12 TP53 splice site 920-2A>T 6 Disease relevant genes with no reportable alterations: ALK, EGFR, ERBB2, MET, RET, ROS1   PDL1 Expression: 90%  PRIOR THERAPY: None  CURRENT THERAPY: Systemic treatment with combination of chemoimmunotherapy with carboplatin  for AUC of 5, Alimta 500 Mg/M2 and Keytruda  200 Mg IV every 3 weeks. Starting from cycle #3 her dose of carboplatin  will be for AUC of 4 and Alimta 400 Mg/M2 secondary to pancytopenia. First dose December 08, 2023. Status post 4 cycles.   INTERVAL HISTORY: Izola Netter Leaks 78 y.o. female returns to the clinic today for a follow-up visit accompanied by her daughter.  The patient is currently undergoing chemotherapy and immunotherapy for her lung cancer. Starting from today, she is expected to start maintenance portion of her treatment. She had some neutropenia which required zarxio  injections. She denies signs or symptoms of infection including dysuria, malodorous urine, skin infections, or URI sx. She also required 2 units of blood in the interval last week.   Since receiving the blood transfusion, she has not noticed significant improvement in her energy, only a little if any at all.    She is having some tearing from the Alimta. She also is having some cold intolerance due to the anemia. She denies any fever or night sweats. Her weight is  stable. Her daughter tries to give her Glucerna if she skips a meal. She is having some taste alteration secondary to the carboplatin  for which she is using mouth rinses. She does not have thrush. She is scheduled to see a nutritionist today.  No nausea, vomiting, or rashes. Her breathing is described as 'okay,' though she experiences some increased windedness with activity, which may be related to her anemia. She denies cough, chest pain, or hemoptysis. She denies shortness of breath at rest. She has not needed to take the stool softener her daughter got for her for constipation. Denies diarrhea. She is here today for evaluation and repeat blood work before undergoing cycle #5.    MEDICAL HISTORY: Past Medical History:  Diagnosis Date   Dyspnea    History of kidney stones    HTN (hypertension)    Smoker     ALLERGIES:  has no known allergies.  MEDICATIONS:  Current Outpatient Medications  Medication Sig Dispense Refill   amLODipine (NORVASC) 10 MG tablet 1 tablet     Cholecalciferol (VITAMIN D3) 1.25 MG (50000 UT) CAPS Take 1 capsule by mouth once a week.     folic acid  (FOLVITE ) 1 MG tablet Take 1 tablet (1 mg total) by mouth daily. Start 7 days before pemetrexed  chemotherapy. Continue until 21 days after pemetrexed  completed. 100 tablet 3   lidocaine -prilocaine  (EMLA ) cream Apply to affected area once 30 g 3   LORazepam  (ATIVAN ) 0.5 MG tablet Take 1 tablet by mouth 30-60 minutes before your scan 2 tablet 0   ondansetron  (ZOFRAN ) 8 MG tablet Take 1 tablet (8 mg  total) by mouth every 8 (eight) hours as needed for nausea or vomiting. Start on the third Froberg after carboplatin . 30 tablet 1   prochlorperazine  (COMPAZINE ) 10 MG tablet Take 1 tablet (10 mg total) by mouth every 6 (six) hours as needed for nausea or vomiting. 30 tablet 1   RESTASIS 0.05 % ophthalmic emulsion Place 1 drop into both eyes 2 (two) times daily.     No current facility-administered medications for this visit.     SURGICAL HISTORY:  Past Surgical History:  Procedure Laterality Date   BRONCHIAL NEEDLE ASPIRATION BIOPSY  10/24/2023   Procedure: BRONCHIAL NEEDLE ASPIRATION BIOPSIES;  Surgeon: Prudy Brownie, DO;  Location: [facility];  Service: Pulmonary;;   COLONOSCOPY     IR IMAGING GUIDED PORT INSERTION  12/09/2023   PILONIDAL CYST EXCISION     tonisllectomy     VIDEO BRONCHOSCOPY WITH ENDOBRONCHIAL ULTRASOUND Right 10/24/2023   Procedure: VIDEO BRONCHOSCOPY WITH ENDOBRONCHIAL ULTRASOUND;  Surgeon: Prudy Brownie, DO;  Location: [facility];  Service: Pulmonary;  Laterality: Right;    REVIEW OF SYSTEMS:   Constitutional: Positive for cold intolerance and stable fatigue. Stable decreased appetite.  Negative for chills and fever.  HENT: Positive for taste alterations. Negative for mouth sores, nosebleeds, sore throat and trouble swallowing.   Eyes: Positive for watery eyes. Negative for eye problems and icterus.  Respiratory: Positive for stable dyspnea on exertion.  Negative for cough, hemoptysis, and wheezing.   Cardiovascular: Negative for chest pain and leg swelling.  Gastrointestinal: Negative for abdominal pain, diarrhea, nausea and vomiting.  Genitourinary: Negative for bladder incontinence, difficulty urinating, dysuria, frequency and hematuria.   Musculoskeletal: Negative for back pain, gait problem, neck pain and neck stiffness.  Skin: Negative for itching and rash.  Neurological: Negative for dizziness, extremity weakness, gait problem, headaches, light-headedness and seizures.  Hematological: Negative for adenopathy. Does not bruise/bleed easily.  Psychiatric/Behavioral: Negative for confusion, depression and sleep disturbance. The patient is not nervous/anxious.     PHYSICAL EXAMINATION:  There were no vitals taken for this visit.  ECOG PERFORMANCE STATUS: 1  Physical Exam  Constitutional: Oriented to person, place, and time and well-developed, well-nourished, and in no  distress.  HENT:  Head: Normocephalic and atraumatic.  Mouth/Throat: Oropharynx is clear and moist. No oropharyngeal exudate.  Eyes: Conjunctivae are normal. Right eye exhibits no discharge. Left eye exhibits no discharge. No scleral icterus.  Neck: Normal range of motion. Neck supple.  Cardiovascular: Normal rate, regular rhythm, normal heart sounds and intact distal pulses.   Pulmonary/Chest: Effort normal and breath sounds normal. No respiratory distress. No wheezes. No rales.  Abdominal: Soft. Bowel sounds are normal. Exhibits no distension and no mass. There is no tenderness.  Musculoskeletal: Normal range of motion. Exhibits no edema.  Lymphadenopathy:    No cervical adenopathy.  Neurological: Alert and oriented to person, place, and time. Exhibits normal muscle tone. Gait normal. Coordination normal.  Skin: Skin is warm and dry. No rash noted. Not diaphoretic. No erythema. No pallor.  Psychiatric: Mood, memory and judgment normal.  Vitals reviewed.  LABORATORY DATA: Lab Results  Component Value Date   WBC 2.0 (L) 02/29/2024   HGB 6.6 (LL) 02/29/2024   HCT 20.4 (L) 02/29/2024   MCV 91.5 02/29/2024   PLT 26 (L) 02/29/2024      Chemistry      Component Value Date/Time   NA 141 02/29/2024 1051   NA 142 10/12/2021 1043   K 3.7 02/29/2024 1051   CL  108 02/29/2024 1051   CO2 27 02/29/2024 1051   BUN 26 (H) 02/29/2024 1051   BUN 17 10/12/2021 1043   CREATININE 1.39 (H) 02/29/2024 1051      Component Value Date/Time   CALCIUM 9.0 02/29/2024 1051   ALKPHOS 57 02/29/2024 1051   AST 24 02/29/2024 1051   ALT 17 02/29/2024 1051   BILITOT 0.6 02/29/2024 1051       RADIOGRAPHIC STUDIES:  CT Chest W Contrast Result Date: 02/16/2024 CLINICAL DATA:  Restaging non-small cell lung cancer. * Tracking Code: BO * EXAM: CT CHEST, ABDOMEN, AND PELVIS WITH CONTRAST TECHNIQUE: Multidetector CT imaging of the chest, abdomen and pelvis was performed following the standard protocol during  bolus administration of intravenous contrast. RADIATION DOSE REDUCTION: This exam was performed according to the departmental dose-optimization program which includes automated exposure control, adjustment of the mA and/or kV according to patient size and/or use of iterative reconstruction technique. CONTRAST:  60mL OMNIPAQUE  IOHEXOL  300 MG/ML  SOLN COMPARISON:  PET-CT 10/26/2023 FINDINGS: CT CHEST FINDINGS Cardiovascular: Cardiac enlargement aortic atherosclerosis and multi vessel coronary artery calcifications. 4.1 cm ascending thoracic aortic aneurysm. Similar appearance right anterior predominant pericardial effusion which appears moderate in volume. Mediastinum/Nodes: Thyroid  gland, trachea and esophagus demonstrate no significant findings. Previous right paratracheal lymph node measures 0.6 cm, image 20/2. Previously 1.5 cm. Right hilar lymph node measures 1.9 cm, image 30/2. Suboptimally visualized on the previous exam reflecting lack of IV contrast material. Lungs/Pleura: Small right pleural effusions/pleural thickening is unchanged in the interval. Poorly defined right infrahilar lung mass measures approximately 4.8 by 3.5 cm on image 32/2. The margins of this mass are difficult to distinguish from atelectatic right lower lobe. On the previous examination this measured approximately 7.4 x 5.6 cm. The bronchus intermedius is now patent (formally occluded.) Previously occluded right middle lobe bronchus is also now patent. Right lower lobe bronchus remains occluded with persistent right lower lobe atelectasis. Well-circumscribed, low attenuation nodule within the right middle lobe measures 2.5 x 2.2 cm, image 83/4. This is been present since 12/25/2012 and is favored to represent a benign pulmonary hamartoma. Within the right upper lobe there is a sub solid, cystic and ground-glass lesion measuring 3.8 x 2.2 by 3.4 cm, image 80/6. This appears unchanged from the previous exam. None Musculoskeletal: No chest  wall mass or suspicious bone lesions identified. CT ABDOMEN PELVIS FINDINGS Hepatobiliary: No focal liver abnormality is seen. No gallstones, gallbladder wall thickening, or biliary dilatation. Pancreas: Unremarkable. No pancreatic ductal dilatation or surrounding inflammatory changes. Spleen: Normal in size without focal abnormality. Adrenals/Urinary Tract: Previous tracer avid left adrenal nodule is difficult to visualize on today's study. Right adrenal gland appears normal. Bilateral Bosniak class 1 kidney cysts are unchanged. Nonobstructing within the interpolar left renal collecting system measures 1.9 cm, image 67/2. bladder appears normal. Stomach/Bowel: Stomach is nondistended. No pathologic dilatation of the large or small bowel loops. No bowel wall thickening or inflammation. Vascular/Lymphatic: Aortic atherosclerosis. No abdominopelvic adenopathy. Reproductive: Uterus and bilateral adnexa are unremarkable. Other: No significant free fluid or fluid collections. No signs of pneumoperitoneum. Musculoskeletal: No acute or suspicious osseous findings. IMPRESSION: 1. Interval decrease in size of right infrahilar lung mass. Previously occluded bronchus intermedius and right middle lobe bronchus are now patent. Persistent occlusion of the right lower lobe bronchus. 2. Interval decrease in size of previous FDG avid right paratracheal lymph node. 3. Previous FDG avid right hilar lymph node currently measures 1.9 cm. This is difficult to compare with the  prior exam which was performed without intravenous contrast material. 4. Previous tracer avid left adrenal nodule is difficult to visualize on today's study. 5. Stable appearance of sub solid, cystic and ground-glass lesion within the right upper lobe. This appears unchanged from the previous exam. Suspicious for pulmonary adenocarcinoma. 6. Stable appearance of small right pleural effusion/pleural thickening. 7. Stable appearance of right anterior predominant  pericardial effusion which appears moderate in volume. 8. Nonobstructing left renal calculus. 9.  Aortic Atherosclerosis (ICD10-I70.0). Electronically Signed   By: Kimberley Penman M.D.   On: 02/16/2024 09:20   CT ABDOMEN PELVIS W CONTRAST Result Date: 02/16/2024 CLINICAL DATA:  Restaging non-small cell lung cancer. * Tracking Code: BO * EXAM: CT CHEST, ABDOMEN, AND PELVIS WITH CONTRAST TECHNIQUE: Multidetector CT imaging of the chest, abdomen and pelvis was performed following the standard protocol during bolus administration of intravenous contrast. RADIATION DOSE REDUCTION: This exam was performed according to the departmental dose-optimization program which includes automated exposure control, adjustment of the mA and/or kV according to patient size and/or use of iterative reconstruction technique. CONTRAST:  60mL OMNIPAQUE  IOHEXOL  300 MG/ML  SOLN COMPARISON:  PET-CT 10/26/2023 FINDINGS: CT CHEST FINDINGS Cardiovascular: Cardiac enlargement aortic atherosclerosis and multi vessel coronary artery calcifications. 4.1 cm ascending thoracic aortic aneurysm. Similar appearance right anterior predominant pericardial effusion which appears moderate in volume. Mediastinum/Nodes: Thyroid  gland, trachea and esophagus demonstrate no significant findings. Previous right paratracheal lymph node measures 0.6 cm, image 20/2. Previously 1.5 cm. Right hilar lymph node measures 1.9 cm, image 30/2. Suboptimally visualized on the previous exam reflecting lack of IV contrast material. Lungs/Pleura: Small right pleural effusions/pleural thickening is unchanged in the interval. Poorly defined right infrahilar lung mass measures approximately 4.8 by 3.5 cm on image 32/2. The margins of this mass are difficult to distinguish from atelectatic right lower lobe. On the previous examination this measured approximately 7.4 x 5.6 cm. The bronchus intermedius is now patent (formally occluded.) Previously occluded right middle lobe bronchus is  also now patent. Right lower lobe bronchus remains occluded with persistent right lower lobe atelectasis. Well-circumscribed, low attenuation nodule within the right middle lobe measures 2.5 x 2.2 cm, image 83/4. This is been present since 12/25/2012 and is favored to represent a benign pulmonary hamartoma. Within the right upper lobe there is a sub solid, cystic and ground-glass lesion measuring 3.8 x 2.2 by 3.4 cm, image 80/6. This appears unchanged from the previous exam. None Musculoskeletal: No chest wall mass or suspicious bone lesions identified. CT ABDOMEN PELVIS FINDINGS Hepatobiliary: No focal liver abnormality is seen. No gallstones, gallbladder wall thickening, or biliary dilatation. Pancreas: Unremarkable. No pancreatic ductal dilatation or surrounding inflammatory changes. Spleen: Normal in size without focal abnormality. Adrenals/Urinary Tract: Previous tracer avid left adrenal nodule is difficult to visualize on today's study. Right adrenal gland appears normal. Bilateral Bosniak class 1 kidney cysts are unchanged. Nonobstructing within the interpolar left renal collecting system measures 1.9 cm, image 67/2. bladder appears normal. Stomach/Bowel: Stomach is nondistended. No pathologic dilatation of the large or small bowel loops. No bowel wall thickening or inflammation. Vascular/Lymphatic: Aortic atherosclerosis. No abdominopelvic adenopathy. Reproductive: Uterus and bilateral adnexa are unremarkable. Other: No significant free fluid or fluid collections. No signs of pneumoperitoneum. Musculoskeletal: No acute or suspicious osseous findings. IMPRESSION: 1. Interval decrease in size of right infrahilar lung mass. Previously occluded bronchus intermedius and right middle lobe bronchus are now patent. Persistent occlusion of the right lower lobe bronchus. 2. Interval decrease in size of previous FDG avid  right paratracheal lymph node. 3. Previous FDG avid right hilar lymph node currently measures 1.9  cm. This is difficult to compare with the prior exam which was performed without intravenous contrast material. 4. Previous tracer avid left adrenal nodule is difficult to visualize on today's study. 5. Stable appearance of sub solid, cystic and ground-glass lesion within the right upper lobe. This appears unchanged from the previous exam. Suspicious for pulmonary adenocarcinoma. 6. Stable appearance of small right pleural effusion/pleural thickening. 7. Stable appearance of right anterior predominant pericardial effusion which appears moderate in volume. 8. Nonobstructing left renal calculus. 9.  Aortic Atherosclerosis (ICD10-I70.0). Electronically Signed   By: Kimberley Penman M.D.   On: 02/16/2024 09:20     ASSESSMENT/PLAN:  This is a very pleasant 78 year old African-American female with stage IV (T4, N2, M1 B) non-small cell lung cancer, adenocarcinoma.  She presented with a large right infrahilar mass in addition to right hilar and mediastinal lymphadenopathy as well as a left adrenal metastasis.  She was diagnosed in December 2024.  She had molecular studies performed by [facility] that showed no actionable mutations.  Her PD-L1 expression is 90%.   The patient is currently undergoing chemotherapy and immunotherapy with carboplatin  for an AUC of 5, Alimta 500 mg/m, Keytruda  200 mg IV every 3 weeks.  She is status post 4 cycles.  Starting from cycle #3, Dr. Marguerita Shih reduced the dose of her chemotherapy due to pancytopenia starting from cycle #4 with carboplatin  for an AUC of 4 and Alimta 400 mg/m.  Patient continues to have cytopenias with the most recent cycle of treatment despite the dose reduction requiring G-CSF injections and blood transfusion.  The patient was seen with Dr. Marguerita Shih.   She is expected to start maintenance treatment today which means she will no longer receive chemotherapy with carboplatin .  She will continue on dose reduced Alimta 400 mg/m and Keytruda .  If she continues to  have intolerance or cytopenias, then with her PD-L1 expression of 90% she would be a candidate for single agent immunotherapy. We will monitor her labs closely with weekly labs in the interval. I have standing orders entered for sample to blood bank.   We would consider her for blood transfusion if her Hbg <8.   Chemotherapy-induced neutropenia Neutropenia with improved neutrophil count post-injections. Continued monitoring necessary to prevent infection. - Monitor neutrophil count closely. - Continue current supportive care measures.  Labs were reviewed.  Recommend that she proceed with cycle #5 today as scheduled.  Will continue drinking Glucerna for extra calories.  I did let her know that the taste alterations are likely secondary to the carboplatin  which she will no longer be receiving after cycle #4.  She will continue using salt water rinses and Biotene. She is scheduled to see a nutritionist today while in the infusion room.    We will see her back for follow-up visit in 3 weeks for evaluation and repeat blood work before undergoing cycle #6.   The patient was advised to call immediately if she has any concerning symptoms in the interval. The patient voices understanding of current disease status and treatment options and is in agreement with the current care plan. All questions were answered. The patient knows to call the clinic with any problems, questions or concerns. We can certainly see the patient much sooner if necessary   No orders of the defined types were placed in this encounter.   Mariyah Upshaw L Vivyan Biggers, PA-C 03/05/24  ADDENDUM: Hematology/Oncology Attending: I had  a face-to-face encounter with the patient today.  I reviewed her records, lab and recommended her care plan.  This is a very pleasant 78 years old African-American female with a stage IV non-small cell lung cancer, adenocarcinoma diagnosed in December 2024 with no actionable mutation and PD-L1 expression  of 90%.  The patient started systemic chemotherapy with carboplatin , Alimta and Keytruda  every 3 weeks for 4 cycles and starting from cycle #5 she will be on reduced dose Alimta in addition to Keytruda  every 3 weeks and if not tolerated because of the pancytopenia, we will discontinue her treatment with Alimta and continue with single agent Keytruda  since the patient has very high PD-L1 expression. The patient and her daughter agreed to the current plan. She will come back for follow-up visit in 3 weeks for evaluation before starting cycle #6. She was advised to call immediately if she has any other concerning symptoms in the interval. The total time spent in the appointment was 20 minutes. Disclaimer: This note was dictated with voice recognition software. Similar sounding words can inadvertently be transcribed and may be missed upon review. Aurelio Blower, MD

## 2024-03-07 ENCOUNTER — Inpatient Hospital Stay: Payer: Medicare Other

## 2024-03-08 ENCOUNTER — Inpatient Hospital Stay

## 2024-03-08 ENCOUNTER — Inpatient Hospital Stay: Attending: Internal Medicine

## 2024-03-08 ENCOUNTER — Encounter: Payer: Self-pay | Admitting: Internal Medicine

## 2024-03-08 ENCOUNTER — Inpatient Hospital Stay: Admitting: Physician Assistant

## 2024-03-08 ENCOUNTER — Inpatient Hospital Stay: Admitting: Nutrition

## 2024-03-08 VITALS — BP 114/70 | HR 77 | Temp 98.3°F | Resp 18

## 2024-03-08 VITALS — BP 136/89 | HR 86 | Temp 98.0°F | Resp 16 | Wt 148.0 lb

## 2024-03-08 DIAGNOSIS — Z5112 Encounter for antineoplastic immunotherapy: Secondary | ICD-10-CM | POA: Diagnosis not present

## 2024-03-08 DIAGNOSIS — D61818 Other pancytopenia: Secondary | ICD-10-CM | POA: Insufficient documentation

## 2024-03-08 DIAGNOSIS — C3431 Malignant neoplasm of lower lobe, right bronchus or lung: Secondary | ICD-10-CM | POA: Diagnosis not present

## 2024-03-08 DIAGNOSIS — D701 Agranulocytosis secondary to cancer chemotherapy: Secondary | ICD-10-CM | POA: Diagnosis not present

## 2024-03-08 DIAGNOSIS — Z87891 Personal history of nicotine dependence: Secondary | ICD-10-CM | POA: Diagnosis not present

## 2024-03-08 DIAGNOSIS — R918 Other nonspecific abnormal finding of lung field: Secondary | ICD-10-CM

## 2024-03-08 DIAGNOSIS — Z79899 Other long term (current) drug therapy: Secondary | ICD-10-CM | POA: Diagnosis not present

## 2024-03-08 DIAGNOSIS — Z5111 Encounter for antineoplastic chemotherapy: Secondary | ICD-10-CM

## 2024-03-08 DIAGNOSIS — D6481 Anemia due to antineoplastic chemotherapy: Secondary | ICD-10-CM

## 2024-03-08 DIAGNOSIS — C7972 Secondary malignant neoplasm of left adrenal gland: Secondary | ICD-10-CM | POA: Insufficient documentation

## 2024-03-08 DIAGNOSIS — T451X5A Adverse effect of antineoplastic and immunosuppressive drugs, initial encounter: Secondary | ICD-10-CM | POA: Diagnosis not present

## 2024-03-08 DIAGNOSIS — C3491 Malignant neoplasm of unspecified part of right bronchus or lung: Secondary | ICD-10-CM | POA: Diagnosis not present

## 2024-03-08 LAB — CBC WITH DIFFERENTIAL (CANCER CENTER ONLY)
Abs Immature Granulocytes: 0.73 10*3/uL — ABNORMAL HIGH (ref 0.00–0.07)
Basophils Absolute: 0 10*3/uL (ref 0.0–0.1)
Basophils Relative: 0 %
Eosinophils Absolute: 0 10*3/uL (ref 0.0–0.5)
Eosinophils Relative: 0 %
HCT: 24.7 % — ABNORMAL LOW (ref 36.0–46.0)
Hemoglobin: 8.2 g/dL — ABNORMAL LOW (ref 12.0–15.0)
Immature Granulocytes: 6 %
Lymphocytes Relative: 15 %
Lymphs Abs: 2 10*3/uL (ref 0.7–4.0)
MCH: 29.9 pg (ref 26.0–34.0)
MCHC: 33.2 g/dL (ref 30.0–36.0)
MCV: 90.1 fL (ref 80.0–100.0)
Monocytes Absolute: 1.9 10*3/uL — ABNORMAL HIGH (ref 0.1–1.0)
Monocytes Relative: 15 %
Neutro Abs: 8.4 10*3/uL — ABNORMAL HIGH (ref 1.7–7.7)
Neutrophils Relative %: 64 %
Platelet Count: 144 10*3/uL — ABNORMAL LOW (ref 150–400)
RBC: 2.74 MIL/uL — ABNORMAL LOW (ref 3.87–5.11)
RDW: 21.1 % — ABNORMAL HIGH (ref 11.5–15.5)
WBC Count: 13.1 10*3/uL — ABNORMAL HIGH (ref 4.0–10.5)
nRBC: 0.2 % (ref 0.0–0.2)

## 2024-03-08 LAB — CMP (CANCER CENTER ONLY)
ALT: 10 U/L (ref 0–44)
AST: 18 U/L (ref 15–41)
Albumin: 3.4 g/dL — ABNORMAL LOW (ref 3.5–5.0)
Alkaline Phosphatase: 69 U/L (ref 38–126)
Anion gap: 6 (ref 5–15)
BUN: 18 mg/dL (ref 8–23)
CO2: 29 mmol/L (ref 22–32)
Calcium: 8.8 mg/dL — ABNORMAL LOW (ref 8.9–10.3)
Chloride: 106 mmol/L (ref 98–111)
Creatinine: 1.44 mg/dL — ABNORMAL HIGH (ref 0.44–1.00)
GFR, Estimated: 37 mL/min — ABNORMAL LOW (ref >60)
Glucose, Bld: 90 mg/dL (ref 70–99)
Potassium: 3.5 mmol/L (ref 3.5–5.1)
Sodium: 141 mmol/L (ref 135–145)
Total Bilirubin: 0.4 mg/dL (ref 0.0–1.2)
Total Protein: 7.7 g/dL (ref 6.5–8.1)

## 2024-03-08 LAB — SAMPLE TO BLOOD BANK

## 2024-03-08 MED ORDER — SODIUM CHLORIDE 0.9 % IV SOLN
INTRAVENOUS | Status: DC
Start: 2024-03-08 — End: 2024-03-08

## 2024-03-08 MED ORDER — SODIUM CHLORIDE 0.9 % IV SOLN
200.0000 mg | Freq: Once | INTRAVENOUS | Status: AC
  Administered 2024-03-08: 200 mg via INTRAVENOUS
  Filled 2024-03-08: qty 8

## 2024-03-08 MED ORDER — SODIUM CHLORIDE 0.9% FLUSH
10.0000 mL | INTRAVENOUS | Status: DC | PRN
  Administered 2024-03-08: 10 mL

## 2024-03-08 MED ORDER — SODIUM CHLORIDE 0.9% FLUSH
10.0000 mL | Freq: Once | INTRAVENOUS | Status: AC
  Administered 2024-03-08: 10 mL

## 2024-03-08 MED ORDER — SODIUM CHLORIDE 0.9 % IV SOLN
400.0000 mg/m2 | Freq: Once | INTRAVENOUS | Status: AC
Start: 2024-03-08 — End: 2024-03-08
  Administered 2024-03-08: 700 mg via INTRAVENOUS
  Filled 2024-03-08: qty 20

## 2024-03-08 MED ORDER — HEPARIN SOD (PORK) LOCK FLUSH 100 UNIT/ML IV SOLN
500.0000 [IU] | Freq: Once | INTRAVENOUS | Status: AC | PRN
  Administered 2024-03-08: 500 [IU]

## 2024-03-08 MED ORDER — PROCHLORPERAZINE MALEATE 10 MG PO TABS
10.0000 mg | ORAL_TABLET | Freq: Once | ORAL | Status: AC
Start: 2024-03-08 — End: 2024-03-08
  Administered 2024-03-08: 10 mg via ORAL
  Filled 2024-03-08: qty 1

## 2024-03-08 NOTE — Progress Notes (Signed)
 Nutrition follow-up completed with patient and daughter during infusion for stage IV non-small cell lung cancer.  Patient is receiving maintenance treatment.  She is followed by Dr. Liam Redhead.  Weight: 148 pounds May 1. 144 pounds 4.8 ounces March 12.  Labs include BUN 18, creatinine 1.44.  Patient reports increased energy.  She continues to report taste alterations.  She thinks baking soda and salt water gargles have helped.  She states some foods taste like metal.  She is drinking 1-2 bottles of Glucerna daily.  She denies nausea, vomiting, constipation, diarrhea.  Nutrition diagnosis: Unintended weight loss has improved.  Intervention: Educated patient to continue strategies for increasing calorie and protein intake throughout the [place]. Continue Glucerna twice daily.  Provided coupons. Enforced strategies for improving taste alterations. Provided nutrition fact sheet for taste alterations.  Monitoring, evaluation, goals: Patient will tolerate adequate calories and protein to minimize weight loss.  Next visit: Thursday, May 22 during infusion.  **Disclaimer: This note was dictated with voice recognition software. Similar sounding words can inadvertently be transcribed and this note may contain transcription errors which may not have been corrected upon publication of note.**

## 2024-03-08 NOTE — Patient Instructions (Signed)
 [facility] - A DEPT OF [place]  Discharge Instructions: Thank you for choosing [facility] to provide your oncology and hematology care.   If you have a lab appointment with the Cancer Center, please go directly to the Cancer Center and check in at the registration area.   Wear comfortable clothing and clothing appropriate for easy access to any Portacath or PICC line.   We strive to give you quality time with your provider. You may need to reschedule your appointment if you arrive late (15 or more minutes).  Arriving late affects you and other patients whose appointments are after yours.  Also, if you miss three or more appointments without notifying the office, you may be dismissed from the clinic at the provider's discretion.      For prescription refill requests, have your pharmacy contact our office and allow 72 hours for refills to be completed.    Today you received the following chemotherapy and/or immunotherapy agents:   PEMEtrexed  Disodium (ALIMTA) and pembrolizumab  (KEYTRUDA )   To help prevent nausea and vomiting after your treatment, we encourage you to take your nausea medication as directed.  BELOW ARE SYMPTOMS THAT SHOULD BE REPORTED IMMEDIATELY: *FEVER GREATER THAN 100.4 F (38 C) OR HIGHER *CHILLS OR SWEATING *NAUSEA AND VOMITING THAT IS NOT CONTROLLED WITH YOUR NAUSEA MEDICATION *UNUSUAL SHORTNESS OF BREATH *UNUSUAL BRUISING OR BLEEDING *URINARY PROBLEMS (pain or burning when urinating, or frequent urination) *BOWEL PROBLEMS (unusual diarrhea, constipation, pain near the anus) TENDERNESS IN MOUTH AND THROAT WITH OR WITHOUT PRESENCE OF ULCERS (sore throat, sores in mouth, or a toothache) UNUSUAL RASH, SWELLING OR PAIN  UNUSUAL VAGINAL DISCHARGE OR ITCHING   Items with * indicate a potential emergency and should be followed up as soon as possible or go to the Emergency Department if any problems should occur.  Please  show the CHEMOTHERAPY ALERT CARD or IMMUNOTHERAPY ALERT CARD at check-in to the Emergency Department and triage nurse.  Should you have questions after your visit or need to cancel or reschedule your appointment, please contact [facility] - A DEPT OF [facility]  Dept: [phone]  and follow the prompts.  Office hours are 8:00 a.m. to 4:30 p.m. Monday - Friday. Please note that voicemails left after 4:00 p.m. may not be returned until the following business [place].  We are closed weekends and major holidays. You have access to a nurse at all times for urgent questions. Please call the main number to the clinic Dept: [phone] and follow the prompts.   For any non-urgent questions, you may also contact your provider using MyChart. We now offer e-Visits for anyone 44 and older to request care online for non-urgent symptoms. For details visit [email or web address].   Also download the MyChart app! Go to the app store, search "MyChart", open the app, select [place], and log in with your MyChart username and password.

## 2024-03-10 ENCOUNTER — Other Ambulatory Visit: Payer: Self-pay

## 2024-03-14 ENCOUNTER — Inpatient Hospital Stay: Payer: Medicare Other

## 2024-03-14 DIAGNOSIS — C7972 Secondary malignant neoplasm of left adrenal gland: Secondary | ICD-10-CM | POA: Diagnosis not present

## 2024-03-14 DIAGNOSIS — Z5112 Encounter for antineoplastic immunotherapy: Secondary | ICD-10-CM | POA: Diagnosis not present

## 2024-03-14 DIAGNOSIS — D61818 Other pancytopenia: Secondary | ICD-10-CM | POA: Diagnosis not present

## 2024-03-14 DIAGNOSIS — R918 Other nonspecific abnormal finding of lung field: Secondary | ICD-10-CM

## 2024-03-14 DIAGNOSIS — C3491 Malignant neoplasm of unspecified part of right bronchus or lung: Secondary | ICD-10-CM | POA: Diagnosis not present

## 2024-03-14 DIAGNOSIS — Z5111 Encounter for antineoplastic chemotherapy: Secondary | ICD-10-CM | POA: Diagnosis not present

## 2024-03-14 DIAGNOSIS — Z79899 Other long term (current) drug therapy: Secondary | ICD-10-CM | POA: Diagnosis not present

## 2024-03-14 DIAGNOSIS — C3431 Malignant neoplasm of lower lobe, right bronchus or lung: Secondary | ICD-10-CM

## 2024-03-14 DIAGNOSIS — Z87891 Personal history of nicotine dependence: Secondary | ICD-10-CM | POA: Diagnosis not present

## 2024-03-14 DIAGNOSIS — D701 Agranulocytosis secondary to cancer chemotherapy: Secondary | ICD-10-CM | POA: Diagnosis not present

## 2024-03-14 DIAGNOSIS — D649 Anemia, unspecified: Secondary | ICD-10-CM

## 2024-03-14 LAB — CMP (CANCER CENTER ONLY)
ALT: 17 U/L (ref 0–44)
AST: 28 U/L (ref 15–41)
Albumin: 3.3 g/dL — ABNORMAL LOW (ref 3.5–5.0)
Alkaline Phosphatase: 52 U/L (ref 38–126)
Anion gap: 10 (ref 5–15)
BUN: 29 mg/dL — ABNORMAL HIGH (ref 8–23)
CO2: 27 mmol/L (ref 22–32)
Calcium: 8.9 mg/dL (ref 8.9–10.3)
Chloride: 100 mmol/L (ref 98–111)
Creatinine: 1.58 mg/dL — ABNORMAL HIGH (ref 0.44–1.00)
GFR, Estimated: 34 mL/min — ABNORMAL LOW (ref >60)
Glucose, Bld: 120 mg/dL — ABNORMAL HIGH (ref 70–99)
Potassium: 4 mmol/L (ref 3.5–5.1)
Sodium: 137 mmol/L (ref 135–145)
Total Bilirubin: 1 mg/dL (ref 0.0–1.2)
Total Protein: 7.9 g/dL (ref 6.5–8.1)

## 2024-03-14 LAB — CBC WITH DIFFERENTIAL (CANCER CENTER ONLY)
Abs Immature Granulocytes: 0.08 10*3/uL — ABNORMAL HIGH (ref 0.00–0.07)
Basophils Absolute: 0 10*3/uL (ref 0.0–0.1)
Basophils Relative: 0 %
Eosinophils Absolute: 0 10*3/uL (ref 0.0–0.5)
Eosinophils Relative: 0 %
HCT: 23.1 % — ABNORMAL LOW (ref 36.0–46.0)
Hemoglobin: 7.8 g/dL — ABNORMAL LOW (ref 12.0–15.0)
Immature Granulocytes: 1 %
Lymphocytes Relative: 4 %
Lymphs Abs: 0.5 10*3/uL — ABNORMAL LOW (ref 0.7–4.0)
MCH: 30.5 pg (ref 26.0–34.0)
MCHC: 33.8 g/dL (ref 30.0–36.0)
MCV: 90.2 fL (ref 80.0–100.0)
Monocytes Absolute: 0.1 10*3/uL (ref 0.1–1.0)
Monocytes Relative: 1 %
Neutro Abs: 10.3 10*3/uL — ABNORMAL HIGH (ref 1.7–7.7)
Neutrophils Relative %: 94 %
Platelet Count: 236 10*3/uL (ref 150–400)
RBC: 2.56 MIL/uL — ABNORMAL LOW (ref 3.87–5.11)
RDW: 20.3 % — ABNORMAL HIGH (ref 11.5–15.5)
WBC Count: 11.1 10*3/uL — ABNORMAL HIGH (ref 4.0–10.5)
nRBC: 0 % (ref 0.0–0.2)

## 2024-03-14 LAB — SAMPLE TO BLOOD BANK

## 2024-03-14 MED ORDER — HEPARIN SOD (PORK) LOCK FLUSH 100 UNIT/ML IV SOLN
500.0000 [IU] | Freq: Once | INTRAVENOUS | Status: AC
  Administered 2024-03-14: 500 [IU]

## 2024-03-14 MED ORDER — SODIUM CHLORIDE 0.9% FLUSH
10.0000 mL | Freq: Once | INTRAVENOUS | Status: AC
  Administered 2024-03-14: 10 mL

## 2024-03-19 ENCOUNTER — Other Ambulatory Visit: Payer: Self-pay

## 2024-03-20 ENCOUNTER — Encounter: Payer: Self-pay | Admitting: Internal Medicine

## 2024-03-20 ENCOUNTER — Telehealth: Payer: Self-pay

## 2024-03-20 NOTE — Telephone Encounter (Signed)
 Received a phone call from patient's daughter this morning. LVM in response. Daughter reported that the patient has been sleeping more than usual, eating very little, appears unsteady, and has remained in bed since Thursday except for getting up to use the bathroom. She also requested a review of the patient's labs from last week.  Spoke with Dr. Marguerita Shih and relayed the daughter's concerns. Dr. Marguerita Shih recommends that the patient either come in today for a lab recheck or keep her scheduled lab recheck appointment for tomorrow. He will evaluate the results for a possible blood transfusion, etc.  LVM for daughter with this information and requested a call back.

## 2024-03-20 NOTE — Telephone Encounter (Signed)
 Spoke with patient's daughter to inform her of the appointment change for tomorrow, including the possibility of a blood transfusion. Appointment scheduled for 1:00 PM for lab work, followed by a potential blood transfusion at 2:00 PM. Daughter voiced understanding and had no further questions at this time.

## 2024-03-21 ENCOUNTER — Other Ambulatory Visit: Payer: Medicare Other

## 2024-03-21 ENCOUNTER — Other Ambulatory Visit: Payer: Self-pay | Admitting: Medical Oncology

## 2024-03-21 ENCOUNTER — Inpatient Hospital Stay

## 2024-03-21 ENCOUNTER — Other Ambulatory Visit: Payer: Self-pay | Admitting: Physician Assistant

## 2024-03-21 ENCOUNTER — Telehealth: Payer: Self-pay | Admitting: Medical Oncology

## 2024-03-21 ENCOUNTER — Ambulatory Visit: Payer: Medicare Other

## 2024-03-21 ENCOUNTER — Ambulatory Visit: Payer: Medicare Other | Admitting: Internal Medicine

## 2024-03-21 DIAGNOSIS — C3491 Malignant neoplasm of unspecified part of right bronchus or lung: Secondary | ICD-10-CM | POA: Diagnosis not present

## 2024-03-21 DIAGNOSIS — Z5112 Encounter for antineoplastic immunotherapy: Secondary | ICD-10-CM | POA: Diagnosis not present

## 2024-03-21 DIAGNOSIS — Z87891 Personal history of nicotine dependence: Secondary | ICD-10-CM | POA: Diagnosis not present

## 2024-03-21 DIAGNOSIS — D649 Anemia, unspecified: Secondary | ICD-10-CM

## 2024-03-21 DIAGNOSIS — Z5111 Encounter for antineoplastic chemotherapy: Secondary | ICD-10-CM | POA: Diagnosis not present

## 2024-03-21 DIAGNOSIS — R918 Other nonspecific abnormal finding of lung field: Secondary | ICD-10-CM

## 2024-03-21 DIAGNOSIS — Z79899 Other long term (current) drug therapy: Secondary | ICD-10-CM | POA: Diagnosis not present

## 2024-03-21 DIAGNOSIS — C7972 Secondary malignant neoplasm of left adrenal gland: Secondary | ICD-10-CM | POA: Diagnosis not present

## 2024-03-21 DIAGNOSIS — R7989 Other specified abnormal findings of blood chemistry: Secondary | ICD-10-CM

## 2024-03-21 DIAGNOSIS — D61818 Other pancytopenia: Secondary | ICD-10-CM | POA: Diagnosis not present

## 2024-03-21 DIAGNOSIS — D701 Agranulocytosis secondary to cancer chemotherapy: Secondary | ICD-10-CM | POA: Diagnosis not present

## 2024-03-21 DIAGNOSIS — C3431 Malignant neoplasm of lower lobe, right bronchus or lung: Secondary | ICD-10-CM

## 2024-03-21 DIAGNOSIS — D6481 Anemia due to antineoplastic chemotherapy: Secondary | ICD-10-CM

## 2024-03-21 LAB — CBC WITH DIFFERENTIAL (CANCER CENTER ONLY)
Abs Immature Granulocytes: 0.03 10*3/uL (ref 0.00–0.07)
Basophils Absolute: 0 10*3/uL (ref 0.0–0.1)
Basophils Relative: 0 %
Eosinophils Absolute: 0 10*3/uL (ref 0.0–0.5)
Eosinophils Relative: 0 %
HCT: 17 % — ABNORMAL LOW (ref 36.0–46.0)
Hemoglobin: 5.7 g/dL — CL (ref 12.0–15.0)
Immature Granulocytes: 1 %
Lymphocytes Relative: 28 %
Lymphs Abs: 1.2 10*3/uL (ref 0.7–4.0)
MCH: 31.1 pg (ref 26.0–34.0)
MCHC: 33.5 g/dL (ref 30.0–36.0)
MCV: 92.9 fL (ref 80.0–100.0)
Monocytes Absolute: 0.4 10*3/uL (ref 0.1–1.0)
Monocytes Relative: 9 %
Neutro Abs: 2.7 10*3/uL (ref 1.7–7.7)
Neutrophils Relative %: 62 %
Platelet Count: 54 10*3/uL — ABNORMAL LOW (ref 150–400)
RBC: 1.83 MIL/uL — ABNORMAL LOW (ref 3.87–5.11)
RDW: 19 % — ABNORMAL HIGH (ref 11.5–15.5)
WBC Count: 4.3 10*3/uL (ref 4.0–10.5)
nRBC: 0 % (ref 0.0–0.2)

## 2024-03-21 LAB — CMP (CANCER CENTER ONLY)
ALT: 12 U/L (ref 0–44)
AST: 25 U/L (ref 15–41)
Albumin: 3 g/dL — ABNORMAL LOW (ref 3.5–5.0)
Alkaline Phosphatase: 46 U/L (ref 38–126)
Anion gap: 7 (ref 5–15)
BUN: 50 mg/dL — ABNORMAL HIGH (ref 8–23)
CO2: 27 mmol/L (ref 22–32)
Calcium: 8.8 mg/dL — ABNORMAL LOW (ref 8.9–10.3)
Chloride: 103 mmol/L (ref 98–111)
Creatinine: 2.19 mg/dL — ABNORMAL HIGH (ref 0.44–1.00)
GFR, Estimated: 23 mL/min — ABNORMAL LOW (ref >60)
Glucose, Bld: 117 mg/dL — ABNORMAL HIGH (ref 70–99)
Potassium: 3.8 mmol/L (ref 3.5–5.1)
Sodium: 137 mmol/L (ref 135–145)
Total Bilirubin: 0.4 mg/dL (ref 0.0–1.2)
Total Protein: 7.4 g/dL (ref 6.5–8.1)

## 2024-03-21 LAB — PREPARE RBC (CROSSMATCH)

## 2024-03-21 LAB — SAMPLE TO BLOOD BANK

## 2024-03-21 MED ORDER — SODIUM CHLORIDE 0.9% IV SOLUTION
250.0000 mL | INTRAVENOUS | Status: DC
  Administered 2024-03-21: 250 mL via INTRAVENOUS

## 2024-03-21 MED ORDER — SODIUM CHLORIDE 0.9 % IV SOLN: Freq: Once | INTRAVENOUS | Status: AC

## 2024-03-21 MED ORDER — SODIUM CHLORIDE 0.9% FLUSH
10.0000 mL | INTRAVENOUS | Status: AC | PRN
Start: 2024-03-21 — End: 2024-03-21
  Administered 2024-03-21: 10 mL

## 2024-03-21 MED ORDER — ACETAMINOPHEN 325 MG PO TABS
650.0000 mg | ORAL_TABLET | Freq: Once | ORAL | Status: AC
  Administered 2024-03-21: 650 mg via ORAL
  Filled 2024-03-21: qty 2

## 2024-03-21 MED ORDER — DIPHENHYDRAMINE HCL 25 MG PO CAPS
25.0000 mg | ORAL_CAPSULE | Freq: Once | ORAL | Status: AC
  Administered 2024-03-21: 25 mg via ORAL
  Filled 2024-03-21: qty 1

## 2024-03-21 MED ORDER — SODIUM CHLORIDE 0.9% FLUSH
10.0000 mL | Freq: Once | INTRAVENOUS | Status: AC
  Administered 2024-03-21: 10 mL

## 2024-03-21 MED ORDER — HEPARIN SOD (PORK) LOCK FLUSH 100 UNIT/ML IV SOLN
500.0000 [IU] | Freq: Every day | INTRAVENOUS | Status: AC | PRN
  Administered 2024-03-21: 500 [IU]

## 2024-03-21 NOTE — Patient Instructions (Signed)
 Blood Transfusion, Adult A blood transfusion is a procedure in which you receive blood or a type of blood cell (blood component) through an IV. You may need a blood transfusion when you have a low blood count, which is a low number of any blood cell. This may result from a bleeding disorder, illness, injury, or surgery. The blood may come from a donor, or you may be able to have your own blood collected and stored (autologous blood donation) before a planned surgery. The blood given in a transfusion may be made up of different blood components. You may receive: Red blood cells. These carry oxygen to the cells in the body. Platelets. These help your blood to clot. Plasma. This is the liquid part of your blood. It carries proteins and other substances throughout the body. White blood cells. These help you fight infections. If you have hemophilia or another clotting disorder, you may also receive other types of blood products. Depending on the type of blood product, this procedure may take 1-4 hours to complete. Tell a health care provider about: Any bleeding problems you have. Any previous reactions you have had during a blood transfusion. Any allergies you have. All medicines you are taking, including vitamins, herbs, eye drops, creams, and over-the-counter medicines. Any surgeries you have had. Any medical conditions you have. Whether you are pregnant or may be pregnant. What are the risks? Talk with your health care provider about risks. The most common problems include: A mild allergic reaction, such as red, swollen areas of skin (hives) and itching. Fever or chills. This may be the body's response to new blood cells received. This may occur during or up to 4 hours after the transfusion. More serious problems may include: A serious allergic reaction that causes difficulty breathing or swelling around the face and lips. Transfusion-associated circulatory overload (TACO), or too much fluid in  the lungs. This may cause breathing problems. Transfusion-related acute lung injury (TRALI), which causes breathing difficulty and low oxygen in the blood. This can occur within hours of the transfusion or several days later. Iron overload. This can happen after receiving many blood transfusions over a period of time. Infection or virus being transmitted. This is rare because donated blood is carefully tested before it is given. Hemolytic transfusion reaction. This is rare. It happens when the body's defense system (immune system)tries to attack the new blood cells. Symptoms may include fever, chills, nausea, low blood pressure, and low back or chest pain. Transfusion-associated graft-versus-host disease (TAGVHD). This is rare. It happens when donated cells attack the body's healthy tissues. What happens before the procedure? You will have a blood test to check your blood type. This test is done to know what kind of blood your body will accept and to match it to the donor blood. If you are going to have a planned surgery, you may be able to do an autologous blood donation. This may be done in case you need to have a transfusion. You will have your temperature, blood pressure, and pulse checked before the transfusion. If you have had an allergic reaction to a transfusion in the past, you may be given medicine to help prevent a reaction. This medicine may be given to you by mouth (orally) or through an IV. What happens during the procedure?  An IV will be inserted into one of your veins. The bag of blood will be attached to your IV. The blood will then enter through your vein. Your temperature, blood pressure,  and pulse will be monitored during the transfusion. This monitoring is done to detect early signs of a transfusion reaction. Tell your nurse right away if you have any of these symptoms during the transfusion: Shortness of breath or trouble breathing. Chest or back pain. Fever or  chills. Itching or hives. If you have any signs or symptoms of a reaction, your transfusion will be stopped and you may be given medicine. When the transfusion is complete, your IV will be removed. Pressure may be applied to the IV site for a few minutes. A bandage (dressing)will be applied. The procedure may vary among health care providers and hospitals. What happens after the procedure? Your temperature, blood pressure, pulse, breathing rate, and blood oxygen level will be monitored until you leave the hospital or clinic. Your blood may be tested to see how you have responded to the transfusion. You may be warmed with fluids or blankets to maintain a normal body temperature. If you receive your blood transfusion in an outpatient setting, you will be told whom to contact to report any reactions. Where to find more information Visit the American Red Cross: redcross.org Summary A blood transfusion is a procedure in which you receive blood or a type of blood cell (blood component) through an IV. The blood given in a transfusion may be made up of different blood components. You may receive red blood cells, platelets, plasma, or white blood cells depending on the condition treated. Your temperature, blood pressure, and pulse will be monitored before, during, and after the transfusion. After the transfusion, your blood may be tested to see how your body has responded. This information is not intended to replace advice given to you by your health care provider. Make sure you discuss any questions you have with your health care provider. Document Revised: 01/22/2022 Document Reviewed: 01/22/2022 Elsevier Patient Education  2024 [employer].

## 2024-03-21 NOTE — Progress Notes (Unsigned)
Pt scheduled for blood transfusion tomorrow

## 2024-03-21 NOTE — Telephone Encounter (Signed)
 CRITICAL VALUE STICKER  CRITICAL VALUE:HGB 5.7  RECEIVER (on-site recipient of call):Soha Thorup, RN DATE & TIME NOTIFIED: 03/21/2024 @1320   MESSENGER (representative from lab):lab  MD NOTIFIED: [place] @ 1322  TIME OF NOTIFICATION:13322  RESPONSE:  pt scheduled for 1 unit of blood today and 1 unit tomorrow.

## 2024-03-21 NOTE — Progress Notes (Signed)
 Blood transfusion ordered for today and tomorrow.

## 2024-03-22 ENCOUNTER — Inpatient Hospital Stay

## 2024-03-22 ENCOUNTER — Telehealth: Payer: Self-pay | Admitting: *Deleted

## 2024-03-22 DIAGNOSIS — Z5112 Encounter for antineoplastic immunotherapy: Secondary | ICD-10-CM | POA: Diagnosis not present

## 2024-03-22 DIAGNOSIS — C7972 Secondary malignant neoplasm of left adrenal gland: Secondary | ICD-10-CM | POA: Diagnosis not present

## 2024-03-22 DIAGNOSIS — D61818 Other pancytopenia: Secondary | ICD-10-CM | POA: Diagnosis not present

## 2024-03-22 DIAGNOSIS — Z87891 Personal history of nicotine dependence: Secondary | ICD-10-CM | POA: Diagnosis not present

## 2024-03-22 DIAGNOSIS — C3491 Malignant neoplasm of unspecified part of right bronchus or lung: Secondary | ICD-10-CM | POA: Diagnosis not present

## 2024-03-22 DIAGNOSIS — D701 Agranulocytosis secondary to cancer chemotherapy: Secondary | ICD-10-CM | POA: Diagnosis not present

## 2024-03-22 DIAGNOSIS — Z79899 Other long term (current) drug therapy: Secondary | ICD-10-CM | POA: Diagnosis not present

## 2024-03-22 DIAGNOSIS — D649 Anemia, unspecified: Secondary | ICD-10-CM

## 2024-03-22 DIAGNOSIS — Z5111 Encounter for antineoplastic chemotherapy: Secondary | ICD-10-CM | POA: Diagnosis not present

## 2024-03-22 MED ORDER — SODIUM CHLORIDE 0.9% IV SOLUTION
250.0000 mL | INTRAVENOUS | Status: DC
  Administered 2024-03-22: 100 mL via INTRAVENOUS

## 2024-03-22 MED ORDER — HEPARIN SOD (PORK) LOCK FLUSH 100 UNIT/ML IV SOLN
500.0000 [IU] | Freq: Every day | INTRAVENOUS | Status: AC | PRN
  Administered 2024-03-22: 500 [IU]

## 2024-03-22 MED ORDER — DIPHENHYDRAMINE HCL 25 MG PO CAPS
25.0000 mg | ORAL_CAPSULE | Freq: Once | ORAL | Status: AC
  Administered 2024-03-22: 25 mg via ORAL
  Filled 2024-03-22: qty 1

## 2024-03-22 MED ORDER — SODIUM CHLORIDE 0.9% FLUSH
10.0000 mL | INTRAVENOUS | Status: AC | PRN
Start: 2024-03-22 — End: 2024-03-22
  Administered 2024-03-22: 10 mL

## 2024-03-22 MED ORDER — ACETAMINOPHEN 325 MG PO TABS
650.0000 mg | ORAL_TABLET | Freq: Once | ORAL | Status: AC
  Administered 2024-03-22: 650 mg via ORAL
  Filled 2024-03-22: qty 2

## 2024-03-22 NOTE — Telephone Encounter (Signed)
 Patient's daughter Dominique Frieze called office. States patient's left ankle is slightly swollen and bruised this morning. She states patient lives with her and that since she works third shift, she is not always home to see what happens. Ramona said patient denies bumping it on anything.  Dr. Marguerita Shih informed. He said patient could elevate ankle while sitting. Patient/family should report bilateral ankle swelling or calf swelling with tenderness/pain. Ramona given information provided by Dr. Marguerita Shih and verbalized understanding

## 2024-03-22 NOTE — Patient Instructions (Signed)
 Blood Transfusion, Adult A blood transfusion is a procedure in which you receive blood or a type of blood cell (blood component) through an IV. You may need a blood transfusion when you have a low blood count, which is a low number of any blood cell. This may result from a bleeding disorder, illness, injury, or surgery. The blood may come from a donor, or you may be able to have your own blood collected and stored (autologous blood donation) before a planned surgery. The blood given in a transfusion may be made up of different blood components. You may receive: Red blood cells. These carry oxygen to the cells in the body. Platelets. These help your blood to clot. Plasma. This is the liquid part of your blood. It carries proteins and other substances throughout the body. White blood cells. These help you fight infections. If you have hemophilia or another clotting disorder, you may also receive other types of blood products. Depending on the type of blood product, this procedure may take 1-4 hours to complete. Tell a health care provider about: Any bleeding problems you have. Any previous reactions you have had during a blood transfusion. Any allergies you have. All medicines you are taking, including vitamins, herbs, eye drops, creams, and over-the-counter medicines. Any surgeries you have had. Any medical conditions you have. Whether you are pregnant or may be pregnant. What are the risks? Talk with your health care provider about risks. The most common problems include: A mild allergic reaction, such as red, swollen areas of skin (hives) and itching. Fever or chills. This may be the body's response to new blood cells received. This may occur during or up to 4 hours after the transfusion. More serious problems may include: A serious allergic reaction that causes difficulty breathing or swelling around the face and lips. Transfusion-associated circulatory overload (TACO), or too much fluid in  the lungs. This may cause breathing problems. Transfusion-related acute lung injury (TRALI), which causes breathing difficulty and low oxygen in the blood. This can occur within hours of the transfusion or several days later. Iron overload. This can happen after receiving many blood transfusions over a period of time. Infection or virus being transmitted. This is rare because donated blood is carefully tested before it is given. Hemolytic transfusion reaction. This is rare. It happens when the body's defense system (immune system)tries to attack the new blood cells. Symptoms may include fever, chills, nausea, low blood pressure, and low back or chest pain. Transfusion-associated graft-versus-host disease (TAGVHD). This is rare. It happens when donated cells attack the body's healthy tissues. What happens before the procedure? You will have a blood test to check your blood type. This test is done to know what kind of blood your body will accept and to match it to the donor blood. If you are going to have a planned surgery, you may be able to do an autologous blood donation. This may be done in case you need to have a transfusion. You will have your temperature, blood pressure, and pulse checked before the transfusion. If you have had an allergic reaction to a transfusion in the past, you may be given medicine to help prevent a reaction. This medicine may be given to you by mouth (orally) or through an IV. What happens during the procedure?  An IV will be inserted into one of your veins. The bag of blood will be attached to your IV. The blood will then enter through your vein. Your temperature, blood pressure,  and pulse will be monitored during the transfusion. This monitoring is done to detect early signs of a transfusion reaction. Tell your nurse right away if you have any of these symptoms during the transfusion: Shortness of breath or trouble breathing. Chest or back pain. Fever or  chills. Itching or hives. If you have any signs or symptoms of a reaction, your transfusion will be stopped and you may be given medicine. When the transfusion is complete, your IV will be removed. Pressure may be applied to the IV site for a few minutes. A bandage (dressing)will be applied. The procedure may vary among health care providers and hospitals. What happens after the procedure? Your temperature, blood pressure, pulse, breathing rate, and blood oxygen level will be monitored until you leave the hospital or clinic. Your blood may be tested to see how you have responded to the transfusion. You may be warmed with fluids or blankets to maintain a normal body temperature. If you receive your blood transfusion in an outpatient setting, you will be told whom to contact to report any reactions. Where to find more information Visit the American Red Cross: redcross.org Summary A blood transfusion is a procedure in which you receive blood or a type of blood cell (blood component) through an IV. The blood given in a transfusion may be made up of different blood components. You may receive red blood cells, platelets, plasma, or white blood cells depending on the condition treated. Your temperature, blood pressure, and pulse will be monitored before, during, and after the transfusion. After the transfusion, your blood may be tested to see how your body has responded. This information is not intended to replace advice given to you by your health care provider. Make sure you discuss any questions you have with your health care provider. Document Revised: 01/22/2022 Document Reviewed: 01/22/2022 Elsevier Patient Education  2024 [employer].

## 2024-03-23 LAB — BPAM RBC
Blood Product Expiration Date: 202506122359
Blood Product Expiration Date: 202506122359
ISSUE DATE / TIME: 202505141419
ISSUE DATE / TIME: 202505150900
Unit Type and Rh: 202506122359
Unit Type and Rh: 5100
Unit Type and Rh: 5100

## 2024-03-23 LAB — TYPE AND SCREEN
ABO/RH(D): O POS
Antibody Screen: NEGATIVE
Unit division: 0
Unit division: 0

## 2024-03-28 ENCOUNTER — Encounter: Payer: Self-pay | Admitting: Internal Medicine

## 2024-03-29 ENCOUNTER — Inpatient Hospital Stay

## 2024-03-29 ENCOUNTER — Encounter: Payer: Self-pay | Admitting: Nutrition

## 2024-03-29 ENCOUNTER — Inpatient Hospital Stay: Admitting: Nutrition

## 2024-03-29 ENCOUNTER — Other Ambulatory Visit: Payer: Self-pay

## 2024-03-29 ENCOUNTER — Inpatient Hospital Stay: Admitting: Internal Medicine

## 2024-03-29 VITALS — BP 105/79 | HR 76 | Temp 97.8°F | Resp 18 | Wt 150.6 lb

## 2024-03-29 DIAGNOSIS — R918 Other nonspecific abnormal finding of lung field: Secondary | ICD-10-CM

## 2024-03-29 DIAGNOSIS — Z79899 Other long term (current) drug therapy: Secondary | ICD-10-CM | POA: Diagnosis not present

## 2024-03-29 DIAGNOSIS — D701 Agranulocytosis secondary to cancer chemotherapy: Secondary | ICD-10-CM | POA: Diagnosis not present

## 2024-03-29 DIAGNOSIS — D6481 Anemia due to antineoplastic chemotherapy: Secondary | ICD-10-CM

## 2024-03-29 DIAGNOSIS — D649 Anemia, unspecified: Secondary | ICD-10-CM

## 2024-03-29 DIAGNOSIS — Z5111 Encounter for antineoplastic chemotherapy: Secondary | ICD-10-CM | POA: Diagnosis not present

## 2024-03-29 DIAGNOSIS — C3431 Malignant neoplasm of lower lobe, right bronchus or lung: Secondary | ICD-10-CM

## 2024-03-29 DIAGNOSIS — Z5112 Encounter for antineoplastic immunotherapy: Secondary | ICD-10-CM | POA: Diagnosis not present

## 2024-03-29 DIAGNOSIS — C7972 Secondary malignant neoplasm of left adrenal gland: Secondary | ICD-10-CM | POA: Diagnosis not present

## 2024-03-29 DIAGNOSIS — Z87891 Personal history of nicotine dependence: Secondary | ICD-10-CM | POA: Diagnosis not present

## 2024-03-29 DIAGNOSIS — D61818 Other pancytopenia: Secondary | ICD-10-CM | POA: Diagnosis not present

## 2024-03-29 DIAGNOSIS — C3491 Malignant neoplasm of unspecified part of right bronchus or lung: Secondary | ICD-10-CM | POA: Diagnosis not present

## 2024-03-29 LAB — CMP (CANCER CENTER ONLY)
ALT: 40 U/L (ref 0–44)
AST: 44 U/L — ABNORMAL HIGH (ref 15–41)
Albumin: 2.9 g/dL — ABNORMAL LOW (ref 3.5–5.0)
Alkaline Phosphatase: 57 U/L (ref 38–126)
Anion gap: 6 (ref 5–15)
BUN: 32 mg/dL — ABNORMAL HIGH (ref 8–23)
CO2: 28 mmol/L (ref 22–32)
Calcium: 8.6 mg/dL — ABNORMAL LOW (ref 8.9–10.3)
Chloride: 108 mmol/L (ref 98–111)
Creatinine: 1.69 mg/dL — ABNORMAL HIGH (ref 0.44–1.00)
GFR, Estimated: 31 mL/min — ABNORMAL LOW (ref >60)
Glucose, Bld: 113 mg/dL — ABNORMAL HIGH (ref 70–99)
Potassium: 3.9 mmol/L (ref 3.5–5.1)
Sodium: 142 mmol/L (ref 135–145)
Total Bilirubin: 0.3 mg/dL (ref 0.0–1.2)
Total Protein: 7.1 g/dL (ref 6.5–8.1)

## 2024-03-29 LAB — CBC WITH DIFFERENTIAL (CANCER CENTER ONLY)
Abs Immature Granulocytes: 0.04 10*3/uL (ref 0.00–0.07)
Basophils Absolute: 0 10*3/uL (ref 0.0–0.1)
Basophils Relative: 0 %
Eosinophils Absolute: 0 10*3/uL (ref 0.0–0.5)
Eosinophils Relative: 0 %
HCT: 24.6 % — ABNORMAL LOW (ref 36.0–46.0)
Hemoglobin: 7.9 g/dL — ABNORMAL LOW (ref 12.0–15.0)
Immature Granulocytes: 1 %
Lymphocytes Relative: 20 %
Lymphs Abs: 1.3 10*3/uL (ref 0.7–4.0)
MCH: 30.3 pg (ref 26.0–34.0)
MCHC: 32.1 g/dL (ref 30.0–36.0)
MCV: 94.3 fL (ref 80.0–100.0)
Monocytes Absolute: 0.6 10*3/uL (ref 0.1–1.0)
Monocytes Relative: 9 %
Neutro Abs: 4.4 10*3/uL (ref 1.7–7.7)
Neutrophils Relative %: 70 %
Platelet Count: 59 10*3/uL — ABNORMAL LOW (ref 150–400)
RBC: 2.61 MIL/uL — ABNORMAL LOW (ref 3.87–5.11)
RDW: 16.9 % — ABNORMAL HIGH (ref 11.5–15.5)
WBC Count: 6.3 10*3/uL (ref 4.0–10.5)
nRBC: 0 % (ref 0.0–0.2)

## 2024-03-29 LAB — SAMPLE TO BLOOD BANK

## 2024-03-29 LAB — TSH: TSH: 1.29 u[IU]/mL (ref 0.350–4.500)

## 2024-03-29 MED ORDER — SODIUM CHLORIDE 0.9% FLUSH
10.0000 mL | Freq: Once | INTRAVENOUS | Status: AC
  Administered 2024-03-29: 10 mL

## 2024-03-29 MED ORDER — CYANOCOBALAMIN 1000 MCG/ML IJ SOLN: 1000.0000 ug | Freq: Once | INTRAMUSCULAR | Status: DC

## 2024-03-29 NOTE — Progress Notes (Signed)
 [facility] [phone]   [phone]  OFFICE PROGRESS NOTE  [name], MD [address] [place] [address]  DIAGNOSIS: stage IV (T4, N2, M1 B) non-small cell lung cancer, adenocarcinoma presented with large right infrahilar mass in addition to right hilar and mediastinal lymphadenopathy as well as left adrenal gland metastasis diagnosed in December 2024.   Biomarker Findings Tumor Mutational Burden - 24 Muts/Mb HRD signature - HRDsig Negative Microsatellite status - MS-Stable Genomic Findings For a complete list of the genes assayed, please refer to the Appendix. BRAF G464V KRAS G13C ASXL1 deletion exon 12 TP53 splice site 920-2A>T 6 Disease relevant genes with no reportable alterations: ALK, EGFR, ERBB2, MET, RET, ROS1  PDL1 Expression: 90%  PRIOR THERAPY: None  CURRENT THERAPY: Systemic treatment with combination of chemoimmunotherapy with carboplatin  for AUC of 5, Alimta 500 Mg/M2 and Keytruda  200 Mg IV every 3 weeks.  First dose December 08, 2023.  Status post 5 cycles.  Starting from cycle #3 her dose of carboplatin  will be for AUC of 4 and Alimta 400 Mg/M2 secondary to pancytopenia.  Starting cycle #5 she was on maintenance treatment with Alimta and Keytruda  every 3 weeks.  Starting from cycle #6 she will be on single agent Keytruda  every 3 weeks.  Alimta discontinued secondary to toxicity.  INTERVAL HISTORY: Tami Shea 78 y.o. female returns to the clinic today for follow-up visit accompanied by her daughter. Discussed the use of AI scribe software for clinical note transcription with the patient, who gave verbal consent to proceed.  History of Present Illness   Tami Shea is a 78 year old female with stage four non-small cell lung cancer who presents for evaluation before starting cycle number six of her treatment. She is accompanied by her daughter.  Diagnosed with stage four non-small cell lung cancer, adenocarcinoma,  in December 2024, she has no actionable mutations but a PD-L1 expression of 90%. Initially treated with carboplatin , Alimta, and Keytruda  every three weeks for four cycles, carboplatin  was discontinued after the fourth cycle. She continues with Alimta and Keytruda  as maintenance therapy every three weeks and is here for evaluation before starting cycle number six.  Recently, her hemoglobin dropped to 5.7 on Mar 21, 2024, requiring a transfusion of two units of blood, which slightly improved her condition. Despite this, she experiences low energy levels and spends significant time in bed. Her appetite has been poor, but she has been consuming Ensure twice a [place], with slight improvement over the last two days. Her hemoglobin has increased to 7.9 today, but she remains anemic. Her platelet count is low at 59,000, which is insufficient for treatment today.  She has bruising on her leg, attributed to a fall she cannot recall. Despite the bruising, she is able to walk on it, and her daughter has been elevating and applying ice to the area. No significant pain is reported in the leg despite the bruising.  No significant pain in her leg despite bruising. Low energy levels and increased time spent in bed. Poor appetite but slightly improving.        MEDICAL HISTORY: Past Medical History:  Diagnosis Date   Dyspnea    History of kidney stones    HTN (hypertension)    Smoker     ALLERGIES:  has no known allergies.  MEDICATIONS:  Current Outpatient Medications  Medication Sig Dispense Refill   amLODipine (NORVASC) 10 MG tablet 1 tablet  Cholecalciferol (VITAMIN D3) 1.25 MG (50000 UT) CAPS Take 1 capsule by mouth once a week.     folic acid  (FOLVITE ) 1 MG tablet Take 1 tablet (1 mg total) by mouth daily. Start 7 days before pemetrexed  chemotherapy. Continue until 21 days after pemetrexed  completed. 100 tablet 3   lidocaine -prilocaine  (EMLA ) cream Apply to affected area once 30 g 3   LORazepam   (ATIVAN ) 0.5 MG tablet Take 1 tablet by mouth 30-60 minutes before your scan 2 tablet 0   ondansetron  (ZOFRAN ) 8 MG tablet Take 1 tablet (8 mg total) by mouth every 8 (eight) hours as needed for nausea or vomiting. Start on the third Heick after carboplatin . (Patient not taking: Reported on 03/08/2024) 30 tablet 1   prochlorperazine  (COMPAZINE ) 10 MG tablet Take 1 tablet (10 mg total) by mouth every 6 (six) hours as needed for nausea or vomiting. (Patient not taking: Reported on 03/08/2024) 30 tablet 1   RESTASIS 0.05 % ophthalmic emulsion Place 1 drop into both eyes 2 (two) times daily.     No current facility-administered medications for this visit.    SURGICAL HISTORY:  Past Surgical History:  Procedure Laterality Date   BRONCHIAL NEEDLE ASPIRATION BIOPSY  10/24/2023   Procedure: BRONCHIAL NEEDLE ASPIRATION BIOPSIES;  Surgeon: Prudy Brownie, DO;  Location: [facility];  Service: Pulmonary;;   COLONOSCOPY     IR IMAGING GUIDED PORT INSERTION  12/09/2023   PILONIDAL CYST EXCISION     tonisllectomy     VIDEO BRONCHOSCOPY WITH ENDOBRONCHIAL ULTRASOUND Right 10/24/2023   Procedure: VIDEO BRONCHOSCOPY WITH ENDOBRONCHIAL ULTRASOUND;  Surgeon: Prudy Brownie, DO;  Location: [facility];  Service: Pulmonary;  Laterality: Right;    REVIEW OF SYSTEMS:  Constitutional: positive for fatigue Eyes: negative Ears, nose, mouth, throat, and face: negative Respiratory: negative Cardiovascular: negative Gastrointestinal: negative Genitourinary:negative Integument/breast: negative Hematologic/lymphatic: negative Musculoskeletal:positive for arthralgias Neurological: negative Behavioral/Psych: negative Endocrine: negative Allergic/Immunologic: negative   PHYSICAL EXAMINATION: General appearance: alert, cooperative, fatigued, and no distress Head: Normocephalic, without obvious abnormality, atraumatic Neck: no adenopathy, no JVD, supple, symmetrical, trachea midline, and thyroid  not enlarged,  symmetric, no tenderness/mass/nodules Lymph nodes: Cervical, supraclavicular, and axillary nodes normal. Resp: clear to auscultation bilaterally Back: symmetric, no curvature. ROM normal. No CVA tenderness. Cardio: regular rate and rhythm, S1, S2 normal, no murmur, click, rub or gallop GI: soft, non-tender; bowel sounds normal; no masses,  no organomegaly Extremities: extremities normal, atraumatic, no cyanosis or edema Neurologic: Alert and oriented X 3, normal strength and tone. Normal symmetric reflexes. Normal coordination and gait  ECOG PERFORMANCE STATUS: 1 - Symptomatic but completely ambulatory  Blood pressure 105/79, pulse 76, temperature 97.8 F (36.6 C), temperature source Temporal, resp. rate 18, weight 150 lb 9.6 oz (68.3 kg), SpO2 99%.  LABORATORY DATA: Lab Results  Component Value Date   WBC 6.3 03/29/2024   HGB 7.9 (L) 03/29/2024   HCT 24.6 (L) 03/29/2024   MCV 94.3 03/29/2024   PLT 59 (L) 03/29/2024      Chemistry      Component Value Date/Time   NA 137 03/21/2024 1258   NA 142 10/12/2021 1043   K 3.8 03/21/2024 1258   CL 103 03/21/2024 1258   CO2 27 03/21/2024 1258   BUN 50 (H) 03/21/2024 1258   BUN 17 10/12/2021 1043   CREATININE 2.19 (H) 03/21/2024 1258      Component Value Date/Time   CALCIUM 8.8 (L) 03/21/2024 1258   ALKPHOS 46 03/21/2024 1258   AST 25 03/21/2024  1258   ALT 12 03/21/2024 1258   BILITOT 0.4 03/21/2024 1258       RADIOGRAPHIC STUDIES: No results found.   ASSESSMENT AND PLAN: This is a very pleasant 78 years old African-American female with  stage IV (T4, N2, M1 B) non-small cell lung cancer, adenocarcinoma presented with large right infrahilar mass in addition to right hilar and mediastinal lymphadenopathy as well as left adrenal gland metastasis diagnosed in December 2024.  She had molecular studies by [facility] that showed no actionable mutations and PD-L1 TPS of 90%. She is currently undergoing treatment with  chemoimmunotherapy with carboplatin  for AUC of 5, Alimta 500 Mg/M2 and Keytruda  200 Mg IV every 3 weeks for 4 cycles followed by maintenance treatment with Alimta and Keytruda .  Status post 5 cycle.  Starting from cycle #3 her dose of carboplatin  was reduced to AUC of 4 and Alimta 400 Mg/M2 secondary to pancytopenia.  Starting cycle #6 she will be on single agent Keytruda  every 3 weeks.  Alimta was discontinued secondary to intolerance and persistent pancytopenia. Assessment and Plan    Stage 4 non-small cell lung cancer, adenocarcinoma Stage 4 non-small cell lung cancer, adenocarcinoma, diagnosed in December 2024 with no actionable mutations but high PD-L1 expression (90%). Currently on maintenance treatment with Alimta and Keytruda  after initial treatment with carboplatin , Alimta, and Keytruda . Considering dropping Alimta due to continuous drop in blood counts, likely due to chemotherapy. Keytruda  monotherapy is a viable option given high PD-L1 expression, which suggests good response to immunotherapy. Keytruda  is less likely to cause further drops in blood counts. - Delay cycle 6 of chemotherapy by one week. - Discontinue Alimta and continue with Keytruda  monotherapy if blood counts recover. - Monitor blood counts and overall condition next week before resuming treatment. - Encourage hydration and nutritional intake to support recovery.  Anemia Severe anemia with hemoglobin dropping to 5.7 g/dL, requiring transfusion of two units of blood. Hemoglobin improved to 7.9 g/dL post-transfusion. Anemia likely related to chemotherapy and low platelet count. Symptoms include fatigue and decreased appetite. - Monitor hemoglobin and platelet counts. - Encourage nutritional intake, including Ensure supplements. - Monitor for symptoms of anemia such as fatigue and bleeding.   The patient was advised to call immediately if she has any other concerning symptoms in the interval.  The patient voices understanding  of current disease status and treatment options and is in agreement with the current care plan.  All questions were answered. The patient knows to call the clinic with any problems, questions or concerns. We can certainly see the patient much sooner if necessary.  The total time spent in the appointment was 30 minutes.  Disclaimer: This note was dictated with voice recognition software. Similar sounding words can inadvertently be transcribed and may not be corrected upon review.

## 2024-03-29 NOTE — Progress Notes (Signed)
 Nutrition follow-up could not be completed.  Today's infusion was canceled.  I will attempt to reschedule with future visits.  Patient has RD contact information from previous visits.

## 2024-03-29 NOTE — Progress Notes (Signed)
 Patient seen by Dr. Regis Captain are within treatment parameters.  Labs reviewed: and are not all within treatment parameters.   Plts 59, Hgb: 7.9  Per physician team, patient will not be receiving treatment today.

## 2024-03-30 LAB — T4: T4, Total: 6.1 ug/dL (ref 4.5–12.0)

## 2024-04-05 ENCOUNTER — Inpatient Hospital Stay

## 2024-04-05 ENCOUNTER — Telehealth: Payer: Self-pay

## 2024-04-05 DIAGNOSIS — Z87891 Personal history of nicotine dependence: Secondary | ICD-10-CM | POA: Diagnosis not present

## 2024-04-05 DIAGNOSIS — D701 Agranulocytosis secondary to cancer chemotherapy: Secondary | ICD-10-CM | POA: Diagnosis not present

## 2024-04-05 DIAGNOSIS — C3491 Malignant neoplasm of unspecified part of right bronchus or lung: Secondary | ICD-10-CM | POA: Diagnosis not present

## 2024-04-05 DIAGNOSIS — R918 Other nonspecific abnormal finding of lung field: Secondary | ICD-10-CM

## 2024-04-05 DIAGNOSIS — Z5111 Encounter for antineoplastic chemotherapy: Secondary | ICD-10-CM | POA: Diagnosis not present

## 2024-04-05 DIAGNOSIS — D6481 Anemia due to antineoplastic chemotherapy: Secondary | ICD-10-CM

## 2024-04-05 DIAGNOSIS — Z79899 Other long term (current) drug therapy: Secondary | ICD-10-CM | POA: Diagnosis not present

## 2024-04-05 DIAGNOSIS — C3431 Malignant neoplasm of lower lobe, right bronchus or lung: Secondary | ICD-10-CM

## 2024-04-05 DIAGNOSIS — D61818 Other pancytopenia: Secondary | ICD-10-CM | POA: Diagnosis not present

## 2024-04-05 DIAGNOSIS — Z5112 Encounter for antineoplastic immunotherapy: Secondary | ICD-10-CM | POA: Diagnosis not present

## 2024-04-05 DIAGNOSIS — C7972 Secondary malignant neoplasm of left adrenal gland: Secondary | ICD-10-CM | POA: Diagnosis not present

## 2024-04-05 LAB — CBC WITH DIFFERENTIAL (CANCER CENTER ONLY)
Abs Immature Granulocytes: 0.03 10*3/uL (ref 0.00–0.07)
Basophils Absolute: 0 10*3/uL (ref 0.0–0.1)
Basophils Relative: 0 %
Eosinophils Absolute: 0 10*3/uL (ref 0.0–0.5)
Eosinophils Relative: 0 %
HCT: 24.2 % — ABNORMAL LOW (ref 36.0–46.0)
Hemoglobin: 7.9 g/dL — ABNORMAL LOW (ref 12.0–15.0)
Immature Granulocytes: 0 %
Lymphocytes Relative: 21 %
Lymphs Abs: 1.6 10*3/uL (ref 0.7–4.0)
MCH: 31 pg (ref 26.0–34.0)
MCHC: 32.6 g/dL (ref 30.0–36.0)
MCV: 94.9 fL (ref 80.0–100.0)
Monocytes Absolute: 1.1 10*3/uL — ABNORMAL HIGH (ref 0.1–1.0)
Monocytes Relative: 15 %
Neutro Abs: 4.6 10*3/uL (ref 1.7–7.7)
Neutrophils Relative %: 64 %
Platelet Count: 172 10*3/uL (ref 150–400)
RBC: 2.55 MIL/uL — ABNORMAL LOW (ref 3.87–5.11)
RDW: 17.7 % — ABNORMAL HIGH (ref 11.5–15.5)
WBC Count: 7.2 10*3/uL (ref 4.0–10.5)
nRBC: 0 % (ref 0.0–0.2)

## 2024-04-05 LAB — CMP (CANCER CENTER ONLY)
ALT: 23 U/L (ref 0–44)
AST: 34 U/L (ref 15–41)
Albumin: 3 g/dL — ABNORMAL LOW (ref 3.5–5.0)
Alkaline Phosphatase: 60 U/L (ref 38–126)
Anion gap: 7 (ref 5–15)
BUN: 23 mg/dL (ref 8–23)
CO2: 29 mmol/L (ref 22–32)
Calcium: 8.7 mg/dL — ABNORMAL LOW (ref 8.9–10.3)
Chloride: 105 mmol/L (ref 98–111)
Creatinine: 1.73 mg/dL — ABNORMAL HIGH (ref 0.44–1.00)
GFR, Estimated: 30 mL/min — ABNORMAL LOW (ref >60)
Glucose, Bld: 102 mg/dL — ABNORMAL HIGH (ref 70–99)
Potassium: 3.9 mmol/L (ref 3.5–5.1)
Sodium: 141 mmol/L (ref 135–145)
Total Bilirubin: 0.4 mg/dL (ref 0.0–1.2)
Total Protein: 7.4 g/dL (ref 6.5–8.1)

## 2024-04-05 LAB — SAMPLE TO BLOOD BANK

## 2024-04-05 LAB — TSH: TSH: 1.73 u[IU]/mL (ref 0.350–4.500)

## 2024-04-05 MED ORDER — HEPARIN SOD (PORK) LOCK FLUSH 100 UNIT/ML IV SOLN
500.0000 [IU] | Freq: Once | INTRAVENOUS | Status: AC
  Administered 2024-04-05: 500 [IU]

## 2024-04-05 MED ORDER — SODIUM CHLORIDE 0.9% FLUSH
10.0000 mL | Freq: Once | INTRAVENOUS | Status: AC
  Administered 2024-04-05: 10 mL

## 2024-04-05 NOTE — Telephone Encounter (Signed)
 CRITICAL VALUE STICKER  CRITICAL VALUE:  Hgb 7.9  RECEIVER (on-site recipient of call): Arbie Knock, LPN  DATE & TIME NOTIFIED: 04/05/24 10:27  MESSENGER (representative from lab): Trenia Fritter  MD NOTIFIED: Marlene Simas, MD  TIME OF NOTIFICATION:10:27  RESPONSE:   *lab only appt with BB hold

## 2024-04-06 ENCOUNTER — Telehealth: Payer: Self-pay

## 2024-04-06 LAB — T4: T4, Total: 8 ug/dL (ref 4.5–12.0)

## 2024-04-06 NOTE — Telephone Encounter (Signed)
 error

## 2024-04-06 NOTE — Telephone Encounter (Signed)
 Advised pt of Hgb 7.9 and per Dr Marguerita Shih we will monitor for now. Pt with VU and agreed to this plan

## 2024-04-06 NOTE — Telephone Encounter (Signed)
 Advised pt her Hgb is 7.9 and per Dr Marguerita Shih we will monitor for now. Pt with VU and agreed to this plan

## 2024-04-09 ENCOUNTER — Other Ambulatory Visit: Payer: Self-pay | Admitting: Physician Assistant

## 2024-04-09 ENCOUNTER — Encounter: Payer: Self-pay | Admitting: Internal Medicine

## 2024-04-09 DIAGNOSIS — C3431 Malignant neoplasm of lower lobe, right bronchus or lung: Secondary | ICD-10-CM

## 2024-04-09 NOTE — Progress Notes (Unsigned)
 [facility] OFFICE PROGRESS NOTE  [name], MD [address], [address] [place] [address]  DIAGNOSIS: stage IV (T4, N2, M1 B) non-small cell lung cancer, adenocarcinoma presented with large right infrahilar mass in addition to right hilar and mediastinal lymphadenopathy as well as left adrenal gland metastasis diagnosed in December 2024.    Biomarker Findings Tumor Mutational Burden - 24 Muts/Mb HRD signature - HRDsig Negative Microsatellite status - MS-Stable Genomic Findings For a complete list of the genes assayed, please refer to the Appendix. BRAF G464V KRAS G13C ASXL1 deletion exon 12 TP53 splice site 920-2A>T 6 Disease relevant genes with no reportable alterations: ALK, EGFR, ERBB2, MET, RET, ROS1   PDL1 Expression: 90%  PRIOR THERAPY: None  CURRENT THERAPY: Systemic treatment with combination of chemoimmunotherapy with carboplatin  for AUC of 5, Alimta 500 Mg/M2 and Keytruda  200 Mg IV every 3 weeks.  First dose December 08, 2023.  Status post 5 cycles.  Starting from cycle #3 her dose of carboplatin  will be for AUC of 4 and Alimta 400 Mg/M2 secondary to pancytopenia.  Starting cycle #5 she was on maintenance treatment with Alimta and Keytruda  every 3 weeks.  Starting from cycle #6 she will be on single agent Keytruda  every 3 weeks.  Alimta discontinued secondary to toxicity.   INTERVAL HISTORY: Tami Shea 78 y.o. female returns to the clinic today for a follow-up visit accompanied by her son. The patient was undergoing chemotherapy and immunotherapy for her lung cancer. She had been struggling with anemia despite her carboplatin  being dropped after cycle #4. Therefore, her chemotherapy (alimta and carboplatin ) portion of her treatment has been discontinued. She is expected to be on single agent immunotherapy with Keytruda  starting from this cycle of treatment, which is scheduled for tomorrow.   No visible bleeding, such as epistaxis, gum bleeding, or melena. She  feels cold, which may be related to her anemia, but denies significant dyspnea, cough, chest pain, or emesis. She denies fevers. Occasional nausea is present for which she uses her anti-emetic.   She received a blood transfusion two weeks ago. She is feeling well today overall except she has an area of increased pigmentation on her posterior left ankle with some ankle edema. She denies traumas or injuries. She is able to ambulate normally. There is no warmth. There may be mild underlying erythema under the area of skin darkening. She denies calf pain. She has not had any surgeries on her left knee or hip. She is not currently taking an iron supplement but drinks orange juice, which can aid in iron absorption. No significant rashes or skin breakouts apart from the leg discoloration.   She is here today for evaluation and repeat blood work before undergoing cycle #6.        MEDICAL HISTORY: Past Medical History:  Diagnosis Date   Dyspnea    History of kidney stones    HTN (hypertension)    Smoker     ALLERGIES:  has no known allergies.  MEDICATIONS:  Current Outpatient Medications  Medication Sig Dispense Refill   amLODipine (NORVASC) 10 MG tablet 1 tablet     Cholecalciferol (VITAMIN D3) 1.25 MG (50000 UT) CAPS Take 1 capsule by mouth once a week.     folic acid  (FOLVITE ) 1 MG tablet Take 1 tablet (1 mg total) by mouth daily. Start 7 days before pemetrexed  chemotherapy. Continue until 21 days after pemetrexed  completed. 100 tablet 3   lidocaine -prilocaine  (EMLA ) cream Apply to affected area  once 30 g 3   LORazepam  (ATIVAN ) 0.5 MG tablet Take 1 tablet by mouth 30-60 minutes before your scan 2 tablet 0   ondansetron  (ZOFRAN ) 8 MG tablet Take 1 tablet (8 mg total) by mouth every 8 (eight) hours as needed for nausea or vomiting. Start on the third Enriquez after carboplatin . (Patient not taking: Reported on 03/08/2024) 30 tablet 1   prochlorperazine  (COMPAZINE ) 10 MG tablet Take 1 tablet (10 mg  total) by mouth every 6 (six) hours as needed for nausea or vomiting. (Patient not taking: Reported on 03/08/2024) 30 tablet 1   RESTASIS 0.05 % ophthalmic emulsion Place 1 drop into both eyes 2 (two) times daily.     No current facility-administered medications for this visit.    SURGICAL HISTORY:  Past Surgical History:  Procedure Laterality Date   BRONCHIAL NEEDLE ASPIRATION BIOPSY  10/24/2023   Procedure: BRONCHIAL NEEDLE ASPIRATION BIOPSIES;  Surgeon: Prudy Brownie, DO;  Location: [facility];  Service: Pulmonary;;   COLONOSCOPY     IR IMAGING GUIDED PORT INSERTION  12/09/2023   PILONIDAL CYST EXCISION     tonisllectomy     VIDEO BRONCHOSCOPY WITH ENDOBRONCHIAL ULTRASOUND Right 10/24/2023   Procedure: VIDEO BRONCHOSCOPY WITH ENDOBRONCHIAL ULTRASOUND;  Surgeon: Prudy Brownie, DO;  Location: [facility];  Service: Pulmonary;  Laterality: Right;    REVIEW OF SYSTEMS:   Review of Systems  Constitutional: Positive for fatigue and cold intolerance. Negative for appetite change, chills,  fever and unexpected weight change.  HENT: Negative for mouth sores, nosebleeds, sore throat and trouble swallowing.   Eyes: Negative for eye problems and icterus.  Respiratory: Negative for cough, hemoptysis, shortness of breath and wheezing.   Cardiovascular: Negative for chest pain. Positive for some left ankle and lower calf swelling with some mild pitting edema.  Gastrointestinal: Occasional mild nausea. Negative for abdominal pain, constipation, diarrhea, and vomiting.  Genitourinary: Negative for bladder incontinence, difficulty urinating, dysuria, frequency and hematuria.   Musculoskeletal: Negative for back pain, gait problem, neck pain and neck stiffness.  Skin: Positive for hyperpigmentation in posterior left ankle.  Neurological: Negative for dizziness, extremity weakness, gait problem, headaches, light-headedness and seizures.  Hematological: Negative for adenopathy. Does not  bruise/bleed easily.  Psychiatric/Behavioral: Negative for confusion, depression and sleep disturbance. The patient is not nervous/anxious.     PHYSICAL EXAMINATION:  There were no vitals taken for this visit.  ECOG PERFORMANCE STATUS: 1  Physical Exam  Constitutional: Oriented to person, place, and time and well-developed, well-nourished, and in no distress.  HENT:  Head: Normocephalic and atraumatic.  Mouth/Throat: Oropharynx is clear and moist. No oropharyngeal exudate.  Eyes: Conjunctivae are normal. Right eye exhibits no discharge. Left eye exhibits no discharge. No scleral icterus.  Neck: Normal range of motion. Neck supple.  Cardiovascular: Normal rate, regular rhythm, normal heart sounds and intact distal pulses.   Pulmonary/Chest: Effort normal and breath sounds normal. No respiratory distress. No wheezes. No rales.  Abdominal: Soft. Bowel sounds are normal. Exhibits no distension and no mass. There is no tenderness.  Musculoskeletal: Normal range of motion. Mild left ankle pitting edema.  Lymphadenopathy:    No cervical adenopathy.  Neurological: Alert and oriented to person, place, and time. Exhibits normal muscle tone. Gait normal. Coordination normal.  Skin: Skin is warm and dry. Positive for hyperpigmentation on posterior left ankle with skin darkening which is dry and thickened appearing. Not diaphoretic. No erythema. No pallor.  Psychiatric: Mood, memory and judgment normal.  Vitals reviewed.  LABORATORY DATA: Lab Results  Component Value Date   WBC 7.2 04/05/2024   HGB 7.9 (L) 04/05/2024   HCT 24.2 (L) 04/05/2024   MCV 94.9 04/05/2024   PLT 172 04/05/2024      Chemistry      Component Value Date/Time   NA 141 04/05/2024 0943   NA 142 10/12/2021 1043   K 3.9 04/05/2024 0943   CL 105 04/05/2024 0943   CO2 29 04/05/2024 0943   BUN 23 04/05/2024 0943   BUN 17 10/12/2021 1043   CREATININE 1.73 (H) 04/05/2024 0943      Component Value Date/Time   CALCIUM  8.7 (L) 04/05/2024 0943   ALKPHOS 60 04/05/2024 0943   AST 34 04/05/2024 0943   ALT 23 04/05/2024 0943   BILITOT 0.4 04/05/2024 0943       RADIOGRAPHIC STUDIES:  No results found.   ASSESSMENT/PLAN:  This is a very pleasant 78 year old African-American female with stage IV (T4, N2, M1 B) non-small cell lung cancer, adenocarcinoma.  She presented with a large right infrahilar mass in addition to right hilar and mediastinal lymphadenopathy as well as a left adrenal metastasis.  She was diagnosed in December 2024.  She had molecular studies performed by [facility] that showed no actionable mutations.  Her PD-L1 expression is 90%.   The patient is currently undergoing chemotherapy and immunotherapy with carboplatin  for an AUC of 5, Alimta 500 mg/m, Keytruda  200 mg IV every 3 weeks.  She is status post 5 cycles.  Starting from cycle #3, Dr. Marguerita Shih reduced the dose of her chemotherapy due to pancytopenia starting from cycle #4 with carboplatin  for an AUC of 4 and Alimta 400 mg/m.  Starting from cycle #5, Dr. Marguerita Shih discontinued her chemotherapy.  Starting from cycle #6 she will be on single agent maintenance Keytruda .  Labs were reviewed. Recommend she proceed with cycle #6 tomorrow as scheduled.   I may want to monitor her labs closely on a weekly basis until her next appointment to ensure her labs are stable. Since we are discontinuing her treatment, would hope and expect her Hbg to improve. However, is she has worsening anemia, then we may need to consider stool cards to ensure no GI blood loss. She denies visible bleeding. She was advised to start taking an iron supplement 1 tablet p.o. daily or every other Stann with orange juice.  For the area of skin darkening, it does look similar to skin hyperpigmentation from her prior chemotherapy with alimta, however, there is some associated swelling. Therefore, given that she is sedentary, has active malignancy, and has unilateral swelling, we will  arrange for doppler ultrasound to ensure no DVT. Could also be venous insuffiencey due to the pitting edema. She was advised to elevate, avoid salty food, and use compression stockings. The area was not warm but there was some mild erythema, therefore sent keflex  250 mg every 8 hours (due to CKD).      We would consider her for blood transfusion if her Hbg <8.    Will continue drinking Glucerna for extra calories.     We will see her back for follow-up visit in 3 weeks for evaluation and repeat blood work before undergoing cycle #7   I have ordered her next restaging CT scan to be performed prior to her next infusion. I have ordered without contrast due to CKD.    The patient was advised to call immediately if she has any concerning symptoms in the interval. The patient  voices understanding of current disease status and treatment options and is in agreement with the current care plan. All questions were answered. The patient knows to call the clinic with any problems, questions or concerns. We can certainly see the patient much sooner if necessary     No orders of the defined types were placed in this encounter.   The total time spent in the appointment was 30-39 minutes  Deano Tomaszewski L Amier Hoyt, PA-C 04/09/24

## 2024-04-10 ENCOUNTER — Other Ambulatory Visit: Payer: Self-pay

## 2024-04-11 ENCOUNTER — Inpatient Hospital Stay: Attending: Internal Medicine

## 2024-04-11 ENCOUNTER — Encounter: Payer: Self-pay | Admitting: Family Medicine

## 2024-04-11 ENCOUNTER — Telehealth: Payer: Self-pay

## 2024-04-11 ENCOUNTER — Other Ambulatory Visit: Payer: Self-pay | Admitting: Physician Assistant

## 2024-04-11 ENCOUNTER — Encounter: Payer: Self-pay | Admitting: Internal Medicine

## 2024-04-11 ENCOUNTER — Inpatient Hospital Stay (HOSPITAL_BASED_OUTPATIENT_CLINIC_OR_DEPARTMENT_OTHER): Admitting: Physician Assistant

## 2024-04-11 VITALS — BP 123/85 | HR 60 | Temp 98.3°F | Resp 16 | Ht 64.0 in | Wt 150.6 lb

## 2024-04-11 DIAGNOSIS — L089 Local infection of the skin and subcutaneous tissue, unspecified: Secondary | ICD-10-CM | POA: Diagnosis not present

## 2024-04-11 DIAGNOSIS — R918 Other nonspecific abnormal finding of lung field: Secondary | ICD-10-CM

## 2024-04-11 DIAGNOSIS — Z5111 Encounter for antineoplastic chemotherapy: Secondary | ICD-10-CM | POA: Insufficient documentation

## 2024-04-11 DIAGNOSIS — R238 Other skin changes: Secondary | ICD-10-CM | POA: Diagnosis not present

## 2024-04-11 DIAGNOSIS — M7989 Other specified soft tissue disorders: Secondary | ICD-10-CM | POA: Diagnosis not present

## 2024-04-11 DIAGNOSIS — Z5112 Encounter for antineoplastic immunotherapy: Secondary | ICD-10-CM

## 2024-04-11 DIAGNOSIS — C3431 Malignant neoplasm of lower lobe, right bronchus or lung: Secondary | ICD-10-CM | POA: Insufficient documentation

## 2024-04-11 DIAGNOSIS — D61818 Other pancytopenia: Secondary | ICD-10-CM | POA: Diagnosis not present

## 2024-04-11 DIAGNOSIS — K5900 Constipation, unspecified: Secondary | ICD-10-CM | POA: Insufficient documentation

## 2024-04-11 DIAGNOSIS — Z79899 Other long term (current) drug therapy: Secondary | ICD-10-CM | POA: Diagnosis not present

## 2024-04-11 DIAGNOSIS — D6481 Anemia due to antineoplastic chemotherapy: Secondary | ICD-10-CM

## 2024-04-11 DIAGNOSIS — D649 Anemia, unspecified: Secondary | ICD-10-CM

## 2024-04-11 DIAGNOSIS — C7972 Secondary malignant neoplasm of left adrenal gland: Secondary | ICD-10-CM | POA: Diagnosis not present

## 2024-04-11 LAB — CBC WITH DIFFERENTIAL (CANCER CENTER ONLY)
Abs Immature Granulocytes: 0.03 10*3/uL (ref 0.00–0.07)
Basophils Absolute: 0 10*3/uL (ref 0.0–0.1)
Basophils Relative: 0 %
Eosinophils Absolute: 0 10*3/uL (ref 0.0–0.5)
Eosinophils Relative: 0 %
HCT: 25.5 % — ABNORMAL LOW (ref 36.0–46.0)
Hemoglobin: 8.1 g/dL — ABNORMAL LOW (ref 12.0–15.0)
Immature Granulocytes: 1 %
Lymphocytes Relative: 23 %
Lymphs Abs: 1.5 10*3/uL (ref 0.7–4.0)
MCH: 30.8 pg (ref 26.0–34.0)
MCHC: 31.8 g/dL (ref 30.0–36.0)
MCV: 97 fL (ref 80.0–100.0)
Monocytes Absolute: 1 10*3/uL (ref 0.1–1.0)
Monocytes Relative: 16 %
Neutro Abs: 3.9 10*3/uL (ref 1.7–7.7)
Neutrophils Relative %: 60 %
Platelet Count: 270 10*3/uL (ref 150–400)
RBC: 2.63 MIL/uL — ABNORMAL LOW (ref 3.87–5.11)
RDW: 20.2 % — ABNORMAL HIGH (ref 11.5–15.5)
WBC Count: 6.4 10*3/uL (ref 4.0–10.5)
nRBC: 0 % (ref 0.0–0.2)

## 2024-04-11 LAB — CMP (CANCER CENTER ONLY)
ALT: 15 U/L (ref 0–44)
AST: 32 U/L (ref 15–41)
Albumin: 3 g/dL — ABNORMAL LOW (ref 3.5–5.0)
Alkaline Phosphatase: 57 U/L (ref 38–126)
Anion gap: 6 (ref 5–15)
BUN: 17 mg/dL (ref 8–23)
CO2: 29 mmol/L (ref 22–32)
Calcium: 8.7 mg/dL — ABNORMAL LOW (ref 8.9–10.3)
Chloride: 106 mmol/L (ref 98–111)
Creatinine: 1.64 mg/dL — ABNORMAL HIGH (ref 0.44–1.00)
GFR, Estimated: 32 mL/min — ABNORMAL LOW (ref >60)
Glucose, Bld: 100 mg/dL — ABNORMAL HIGH (ref 70–99)
Potassium: 3.6 mmol/L (ref 3.5–5.1)
Sodium: 141 mmol/L (ref 135–145)
Total Bilirubin: 0.4 mg/dL (ref 0.0–1.2)
Total Protein: 7.6 g/dL (ref 6.5–8.1)

## 2024-04-11 LAB — SAMPLE TO BLOOD BANK

## 2024-04-11 LAB — TSH: TSH: 1.14 u[IU]/mL (ref 0.350–4.500)

## 2024-04-11 MED ORDER — CEPHALEXIN 250 MG PO CAPS: 250.0000 mg | ORAL_CAPSULE | Freq: Three times a day (TID) | ORAL | 0 refills | Status: DC

## 2024-04-11 MED ORDER — SODIUM CHLORIDE 0.9% FLUSH
10.0000 mL | Freq: Once | INTRAVENOUS | Status: AC
  Administered 2024-04-11: 10 mL

## 2024-04-11 MED ORDER — HEPARIN SOD (PORK) LOCK FLUSH 100 UNIT/ML IV SOLN
500.0000 [IU] | Freq: Once | INTRAVENOUS | Status: AC
  Administered 2024-04-11: 500 [IU]

## 2024-04-11 NOTE — Telephone Encounter (Signed)
 Contacted [facility]; spoke with Abe Abed, who stated that due to limited staffing (only one tech currently available), patients are being referred to [facility] [address].  Attempted to contact the [facility] to schedule the patient for an ultrasound. Left a voicemail requesting a return call to arrange the appointment.

## 2024-04-11 NOTE — Telephone Encounter (Signed)
 Spoke with Perla Bradford from [facility], who called to confirm if a PA is required for the ultrasound. Informed her that no PA is needed for the ultrasound. She stated she will contact the patient directly to schedule the appointment.

## 2024-04-11 NOTE — Telephone Encounter (Signed)
 Noted that the patient is scheduled for a vascular ultrasound at [facility] tomorrow at 2:30 PM and also has an infusion appointment at [facility] scheduled for 2:00 PM.   Attempted to contact Tami Shea to reschedule the ultrasound. Left a message requesting a return call.

## 2024-04-11 NOTE — Telephone Encounter (Signed)
 Spoke with Tami Shea and rescheduled the patient's ultrasound to tomorrow at 12:00 PM at the [facility] on [address].   Spoke with the patient's daughter, Ramona, and informed her of the new appointment details. She stated that she has to work but one of her brothers will ensure the patient attends the appointment.

## 2024-04-12 ENCOUNTER — Telehealth: Payer: Self-pay | Admitting: Internal Medicine

## 2024-04-12 ENCOUNTER — Encounter (HOSPITAL_BASED_OUTPATIENT_CLINIC_OR_DEPARTMENT_OTHER)

## 2024-04-12 ENCOUNTER — Inpatient Hospital Stay

## 2024-04-12 ENCOUNTER — Encounter: Payer: Self-pay | Admitting: Internal Medicine

## 2024-04-12 ENCOUNTER — Ambulatory Visit (HOSPITAL_COMMUNITY)
Admission: RE | Admit: 2024-04-12 | Discharge: 2024-04-12 | Disposition: A | Discharge status: Home / Self Care | Source: Ambulatory Visit | Attending: Physician Assistant | Admitting: Physician Assistant

## 2024-04-12 ENCOUNTER — Inpatient Hospital Stay: Admitting: Dietician

## 2024-04-12 ENCOUNTER — Telehealth: Payer: Self-pay | Admitting: Medical Oncology

## 2024-04-12 VITALS — BP 110/76 | HR 81 | Temp 98.0°F | Resp 18

## 2024-04-12 DIAGNOSIS — D61818 Other pancytopenia: Secondary | ICD-10-CM | POA: Diagnosis not present

## 2024-04-12 DIAGNOSIS — C3431 Malignant neoplasm of lower lobe, right bronchus or lung: Secondary | ICD-10-CM | POA: Diagnosis not present

## 2024-04-12 DIAGNOSIS — M7989 Other specified soft tissue disorders: Secondary | ICD-10-CM

## 2024-04-12 DIAGNOSIS — C7972 Secondary malignant neoplasm of left adrenal gland: Secondary | ICD-10-CM | POA: Diagnosis not present

## 2024-04-12 DIAGNOSIS — Z5111 Encounter for antineoplastic chemotherapy: Secondary | ICD-10-CM | POA: Diagnosis not present

## 2024-04-12 DIAGNOSIS — R238 Other skin changes: Secondary | ICD-10-CM | POA: Diagnosis not present

## 2024-04-12 DIAGNOSIS — K5900 Constipation, unspecified: Secondary | ICD-10-CM | POA: Diagnosis not present

## 2024-04-12 DIAGNOSIS — Z5112 Encounter for antineoplastic immunotherapy: Secondary | ICD-10-CM | POA: Diagnosis not present

## 2024-04-12 DIAGNOSIS — Z79899 Other long term (current) drug therapy: Secondary | ICD-10-CM | POA: Diagnosis not present

## 2024-04-12 LAB — T4: T4, Total: 8.7 ug/dL (ref 4.5–12.0)

## 2024-04-12 MED ORDER — PROCHLORPERAZINE MALEATE 10 MG PO TABS
10.0000 mg | ORAL_TABLET | Freq: Once | ORAL | Status: DC
Start: 2024-04-12 — End: 2024-04-12

## 2024-04-12 MED ORDER — SODIUM CHLORIDE 0.9% FLUSH
10.0000 mL | INTRAVENOUS | Status: DC | PRN
  Administered 2024-04-12: 10 mL

## 2024-04-12 MED ORDER — SODIUM CHLORIDE 0.9 % IV SOLN: INTRAVENOUS | Status: DC

## 2024-04-12 MED ORDER — HEPARIN SOD (PORK) LOCK FLUSH 100 UNIT/ML IV SOLN
500.0000 [IU] | Freq: Once | INTRAVENOUS | Status: AC | PRN
  Administered 2024-04-12: 500 [IU]

## 2024-04-12 MED ORDER — CYANOCOBALAMIN 1000 MCG/ML IJ SOLN
1000.0000 ug | Freq: Once | INTRAMUSCULAR | Status: AC
  Administered 2024-04-12: 1000 ug via INTRAMUSCULAR
  Filled 2024-04-12: qty 1

## 2024-04-12 MED ORDER — SODIUM CHLORIDE 0.9 % IV SOLN
200.0000 mg | Freq: Once | INTRAVENOUS | Status: AC
  Administered 2024-04-12: 200 mg via INTRAVENOUS
  Filled 2024-04-12: qty 8

## 2024-04-12 NOTE — Progress Notes (Signed)
 Per Cassie PA, ok to treat today with SCR 1.64

## 2024-04-12 NOTE — Telephone Encounter (Signed)
 Made several attempts to get a hold of the patients family to see if they were going to be able to make it to today's appointments per a voicemail that was left by the son. I left a voicemail for the patient to call us  back to confirm today's appointments or to reschedule.

## 2024-04-12 NOTE — Progress Notes (Signed)
 Nutrition Follow-up:  Pt with stage IV NSCLC. She is receiving carboplatin /alimta + keytruda  q21d. Patient is under the care of Dr. Marguerita Shih.   Met with pt in infusion. She reports ongoing altered taste. Patient has not tried baking soda salt water rinses. Patient tolerating tart flavors. Learned that she likes apple cider vinegar. She no longer likes chicken which has been staple protein for her. Patient son is a Acupuncturist and he continues trying new foods. Patient is drinking 2 Glucerna.    Medications: reviewed   Labs: Hgb 8.1, Cr 1.64, albumin 3.0  Anthropometrics: Wt 150 lb 9.6 oz on 6/4 - stable  5/22 - 150 lb 9.6 oz 4/10 - 148 lb 6.4 oz    NUTRITION DIAGNOSIS: Unintended wt loss - stable   INTERVENTION:  Reviewed strategies for taste/smell aversions - pt agreeable to try baking soda salt water gargles. Suggested using vinegar based marinades, adding extra mustard to egg/chicken salad, squeezing citrus fruits on other foods to brighten flavor Continue 2 Glucerna      MONITORING, EVALUATION, GOAL: wt trends, intake    NEXT VISIT: To be scheduled as needed

## 2024-04-12 NOTE — Patient Instructions (Signed)

## 2024-04-12 NOTE — Telephone Encounter (Signed)
 Per Cassie , I told Tami Shea that pt US  was negative for a DVT. I instructed her to tell pt to take keflex  as ordered and to elevate legs, decrease salt intake and wear compression stockings periodically .

## 2024-04-19 ENCOUNTER — Ambulatory Visit: Admitting: Physician Assistant

## 2024-04-19 ENCOUNTER — Ambulatory Visit

## 2024-04-19 ENCOUNTER — Other Ambulatory Visit

## 2024-04-19 ENCOUNTER — Other Ambulatory Visit: Payer: Self-pay | Admitting: Physician Assistant

## 2024-04-19 ENCOUNTER — Encounter: Admitting: Nutrition

## 2024-04-19 ENCOUNTER — Telehealth: Payer: Self-pay | Admitting: Medical Oncology

## 2024-04-19 ENCOUNTER — Inpatient Hospital Stay

## 2024-04-19 DIAGNOSIS — Z5111 Encounter for antineoplastic chemotherapy: Secondary | ICD-10-CM | POA: Diagnosis not present

## 2024-04-19 DIAGNOSIS — D61818 Other pancytopenia: Secondary | ICD-10-CM | POA: Diagnosis not present

## 2024-04-19 DIAGNOSIS — R238 Other skin changes: Secondary | ICD-10-CM | POA: Diagnosis not present

## 2024-04-19 DIAGNOSIS — Z79899 Other long term (current) drug therapy: Secondary | ICD-10-CM | POA: Diagnosis not present

## 2024-04-19 DIAGNOSIS — C3431 Malignant neoplasm of lower lobe, right bronchus or lung: Secondary | ICD-10-CM | POA: Diagnosis not present

## 2024-04-19 DIAGNOSIS — C7972 Secondary malignant neoplasm of left adrenal gland: Secondary | ICD-10-CM | POA: Diagnosis not present

## 2024-04-19 DIAGNOSIS — D649 Anemia, unspecified: Secondary | ICD-10-CM

## 2024-04-19 DIAGNOSIS — Z5112 Encounter for antineoplastic immunotherapy: Secondary | ICD-10-CM | POA: Diagnosis not present

## 2024-04-19 DIAGNOSIS — R918 Other nonspecific abnormal finding of lung field: Secondary | ICD-10-CM

## 2024-04-19 DIAGNOSIS — K5900 Constipation, unspecified: Secondary | ICD-10-CM | POA: Diagnosis not present

## 2024-04-19 LAB — CBC WITH DIFFERENTIAL (CANCER CENTER ONLY)
Abs Immature Granulocytes: 0.03 10*3/uL (ref 0.00–0.07)
Basophils Absolute: 0 10*3/uL (ref 0.0–0.1)
Basophils Relative: 0 %
Eosinophils Absolute: 0 10*3/uL (ref 0.0–0.5)
Eosinophils Relative: 0 %
HCT: 24.7 % — ABNORMAL LOW (ref 36.0–46.0)
Hemoglobin: 7.9 g/dL — ABNORMAL LOW (ref 12.0–15.0)
Immature Granulocytes: 0 %
Lymphocytes Relative: 25 %
Lymphs Abs: 1.9 10*3/uL (ref 0.7–4.0)
MCH: 31.9 pg (ref 26.0–34.0)
MCHC: 32 g/dL (ref 30.0–36.0)
MCV: 99.6 fL (ref 80.0–100.0)
Monocytes Absolute: 1.3 10*3/uL — ABNORMAL HIGH (ref 0.1–1.0)
Monocytes Relative: 17 %
Neutro Abs: 4.4 10*3/uL (ref 1.7–7.7)
Neutrophils Relative %: 58 %
Platelet Count: 190 10*3/uL (ref 150–400)
RBC: 2.48 MIL/uL — ABNORMAL LOW (ref 3.87–5.11)
RDW: 20.6 % — ABNORMAL HIGH (ref 11.5–15.5)
WBC Count: 7.6 10*3/uL (ref 4.0–10.5)
nRBC: 0 % (ref 0.0–0.2)

## 2024-04-19 LAB — CMP (CANCER CENTER ONLY)
ALT: 14 U/L (ref 0–44)
AST: 33 U/L (ref 15–41)
Albumin: 2.7 g/dL — ABNORMAL LOW (ref 3.5–5.0)
Alkaline Phosphatase: 56 U/L (ref 38–126)
Anion gap: 8 (ref 5–15)
BUN: 24 mg/dL — ABNORMAL HIGH (ref 8–23)
CO2: 27 mmol/L (ref 22–32)
Calcium: 8.8 mg/dL — ABNORMAL LOW (ref 8.9–10.3)
Chloride: 106 mmol/L (ref 98–111)
Creatinine: 1.97 mg/dL — ABNORMAL HIGH (ref 0.44–1.00)
GFR, Estimated: 26 mL/min — ABNORMAL LOW (ref >60)
Glucose, Bld: 90 mg/dL (ref 70–99)
Potassium: 4 mmol/L (ref 3.5–5.1)
Sodium: 141 mmol/L (ref 135–145)
Total Bilirubin: 0.5 mg/dL (ref 0.0–1.2)
Total Protein: 7.2 g/dL (ref 6.5–8.1)

## 2024-04-19 LAB — PREPARE RBC (CROSSMATCH)

## 2024-04-19 LAB — IRON AND IRON BINDING CAPACITY (CC-WL,HP ONLY)
Iron: 53 ug/dL (ref 28–170)
Saturation Ratios: 25 % (ref 10.4–31.8)
TIBC: 213 ug/dL — ABNORMAL LOW (ref 250–450)
UIBC: 160 ug/dL

## 2024-04-19 LAB — SAMPLE TO BLOOD BANK

## 2024-04-19 LAB — FERRITIN: Ferritin: 3117 ng/mL — ABNORMAL HIGH (ref 11–307)

## 2024-04-19 MED ORDER — SODIUM CHLORIDE 0.9% FLUSH
10.0000 mL | Freq: Once | INTRAVENOUS | Status: AC
  Administered 2024-04-19: 10 mL

## 2024-04-19 MED ORDER — HEPARIN SOD (PORK) LOCK FLUSH 100 UNIT/ML IV SOLN
500.0000 [IU] | Freq: Once | INTRAVENOUS | Status: AC
  Administered 2024-04-19: 500 [IU]

## 2024-04-19 NOTE — Telephone Encounter (Signed)
 Returned Ramona's call.I gave her the number to central scheduling to schedule CT scan.

## 2024-04-19 NOTE — Progress Notes (Signed)
 Patient here for port flush/labs.  Daughter request patient's CT Scan be scheduled on 05/03/24 due to it being her [place] off.  Sent Secure chat to Diane Bell/RN.  Patient request a call from Diane.

## 2024-04-20 ENCOUNTER — Inpatient Hospital Stay

## 2024-04-20 DIAGNOSIS — D61818 Other pancytopenia: Secondary | ICD-10-CM | POA: Diagnosis not present

## 2024-04-20 DIAGNOSIS — K5900 Constipation, unspecified: Secondary | ICD-10-CM | POA: Diagnosis not present

## 2024-04-20 DIAGNOSIS — C7972 Secondary malignant neoplasm of left adrenal gland: Secondary | ICD-10-CM | POA: Diagnosis not present

## 2024-04-20 DIAGNOSIS — Z5112 Encounter for antineoplastic immunotherapy: Secondary | ICD-10-CM | POA: Diagnosis not present

## 2024-04-20 DIAGNOSIS — Z5111 Encounter for antineoplastic chemotherapy: Secondary | ICD-10-CM | POA: Diagnosis not present

## 2024-04-20 DIAGNOSIS — R238 Other skin changes: Secondary | ICD-10-CM | POA: Diagnosis not present

## 2024-04-20 DIAGNOSIS — D649 Anemia, unspecified: Secondary | ICD-10-CM

## 2024-04-20 DIAGNOSIS — Z79899 Other long term (current) drug therapy: Secondary | ICD-10-CM | POA: Diagnosis not present

## 2024-04-20 DIAGNOSIS — C3431 Malignant neoplasm of lower lobe, right bronchus or lung: Secondary | ICD-10-CM | POA: Diagnosis not present

## 2024-04-20 MED ORDER — SODIUM CHLORIDE 0.9% IV SOLUTION
250.0000 mL | INTRAVENOUS | Status: DC
  Administered 2024-04-20: 100 mL via INTRAVENOUS

## 2024-04-20 MED ORDER — ACETAMINOPHEN 325 MG PO TABS
650.0000 mg | ORAL_TABLET | Freq: Once | ORAL | Status: AC
  Administered 2024-04-20: 650 mg via ORAL
  Filled 2024-04-20: qty 2

## 2024-04-20 MED ORDER — DIPHENHYDRAMINE HCL 25 MG PO CAPS
25.0000 mg | ORAL_CAPSULE | Freq: Once | ORAL | Status: AC
  Administered 2024-04-20: 25 mg via ORAL
  Filled 2024-04-20: qty 1

## 2024-04-20 NOTE — Patient Instructions (Signed)

## 2024-04-22 ENCOUNTER — Other Ambulatory Visit: Payer: Self-pay

## 2024-04-23 LAB — TYPE AND SCREEN
ABO/RH(D): O POS
Antibody Screen: NEGATIVE
Unit division: 0

## 2024-04-23 LAB — BPAM RBC
Blood Product Expiration Date: 202506292359
ISSUE DATE / TIME: 202506131329
Unit Type and Rh: 5100

## 2024-04-26 ENCOUNTER — Inpatient Hospital Stay

## 2024-04-26 ENCOUNTER — Ambulatory Visit (HOSPITAL_COMMUNITY)
Admission: RE | Admit: 2024-04-26 | Discharge: 2024-04-26 | Disposition: A | Discharge status: Home / Self Care | Source: Ambulatory Visit | Attending: Physician Assistant | Admitting: Physician Assistant

## 2024-04-26 ENCOUNTER — Ambulatory Visit (HOSPITAL_COMMUNITY)

## 2024-04-26 DIAGNOSIS — C3431 Malignant neoplasm of lower lobe, right bronchus or lung: Secondary | ICD-10-CM

## 2024-04-26 DIAGNOSIS — I771 Stricture of artery: Secondary | ICD-10-CM | POA: Diagnosis not present

## 2024-04-26 DIAGNOSIS — C7972 Secondary malignant neoplasm of left adrenal gland: Secondary | ICD-10-CM | POA: Diagnosis not present

## 2024-04-26 DIAGNOSIS — R918 Other nonspecific abnormal finding of lung field: Secondary | ICD-10-CM

## 2024-04-26 DIAGNOSIS — R238 Other skin changes: Secondary | ICD-10-CM | POA: Diagnosis not present

## 2024-04-26 DIAGNOSIS — I700 Atherosclerosis of aorta: Secondary | ICD-10-CM | POA: Diagnosis not present

## 2024-04-26 DIAGNOSIS — Z5112 Encounter for antineoplastic immunotherapy: Secondary | ICD-10-CM | POA: Diagnosis not present

## 2024-04-26 DIAGNOSIS — Z79899 Other long term (current) drug therapy: Secondary | ICD-10-CM | POA: Diagnosis not present

## 2024-04-26 DIAGNOSIS — K5900 Constipation, unspecified: Secondary | ICD-10-CM | POA: Diagnosis not present

## 2024-04-26 DIAGNOSIS — I7121 Aneurysm of the ascending aorta, without rupture: Secondary | ICD-10-CM | POA: Diagnosis not present

## 2024-04-26 DIAGNOSIS — D61818 Other pancytopenia: Secondary | ICD-10-CM | POA: Diagnosis not present

## 2024-04-26 DIAGNOSIS — Z5111 Encounter for antineoplastic chemotherapy: Secondary | ICD-10-CM | POA: Diagnosis not present

## 2024-04-26 LAB — CBC WITH DIFFERENTIAL (CANCER CENTER ONLY)
Abs Immature Granulocytes: 0.03 10*3/uL (ref 0.00–0.07)
Basophils Absolute: 0 10*3/uL (ref 0.0–0.1)
Basophils Relative: 0 %
Eosinophils Absolute: 0 10*3/uL (ref 0.0–0.5)
Eosinophils Relative: 0 %
HCT: 30.5 % — ABNORMAL LOW (ref 36.0–46.0)
Hemoglobin: 9.9 g/dL — ABNORMAL LOW (ref 12.0–15.0)
Immature Granulocytes: 0 %
Lymphocytes Relative: 22 %
Lymphs Abs: 2.1 10*3/uL (ref 0.7–4.0)
MCH: 31.2 pg (ref 26.0–34.0)
MCHC: 32.5 g/dL (ref 30.0–36.0)
MCV: 96.2 fL (ref 80.0–100.0)
Monocytes Absolute: 1.2 10*3/uL — ABNORMAL HIGH (ref 0.1–1.0)
Monocytes Relative: 13 %
Neutro Abs: 6.2 10*3/uL (ref 1.7–7.7)
Neutrophils Relative %: 65 %
Platelet Count: 190 10*3/uL (ref 150–400)
RBC: 3.17 MIL/uL — ABNORMAL LOW (ref 3.87–5.11)
RDW: 19.5 % — ABNORMAL HIGH (ref 11.5–15.5)
WBC Count: 9.5 10*3/uL (ref 4.0–10.5)
nRBC: 0 % (ref 0.0–0.2)

## 2024-04-26 LAB — CMP (CANCER CENTER ONLY)
ALT: 10 U/L (ref 0–44)
AST: 28 U/L (ref 15–41)
Albumin: 3.3 g/dL — ABNORMAL LOW (ref 3.5–5.0)
Alkaline Phosphatase: 61 U/L (ref 38–126)
Anion gap: 7 (ref 5–15)
BUN: 16 mg/dL (ref 8–23)
CO2: 27 mmol/L (ref 22–32)
Calcium: 9 mg/dL (ref 8.9–10.3)
Chloride: 106 mmol/L (ref 98–111)
Creatinine: 1.67 mg/dL — ABNORMAL HIGH (ref 0.44–1.00)
GFR, Estimated: 31 mL/min — ABNORMAL LOW (ref >60)
Glucose, Bld: 96 mg/dL (ref 70–99)
Potassium: 3.7 mmol/L (ref 3.5–5.1)
Sodium: 140 mmol/L (ref 135–145)
Total Bilirubin: 0.6 mg/dL (ref 0.0–1.2)
Total Protein: 8.2 g/dL — ABNORMAL HIGH (ref 6.5–8.1)

## 2024-04-26 LAB — SAMPLE TO BLOOD BANK

## 2024-04-26 MED ORDER — HEPARIN SOD (PORK) LOCK FLUSH 100 UNIT/ML IV SOLN
500.0000 [IU] | Freq: Once | INTRAVENOUS | Status: AC
  Administered 2024-04-26: 500 [IU]

## 2024-04-26 MED ORDER — SODIUM CHLORIDE 0.9% FLUSH
10.0000 mL | Freq: Once | INTRAVENOUS | Status: AC
  Administered 2024-04-26: 10 mL

## 2024-05-02 ENCOUNTER — Inpatient Hospital Stay: Admitting: Internal Medicine

## 2024-05-02 ENCOUNTER — Inpatient Hospital Stay

## 2024-05-02 VITALS — BP 113/72 | HR 71 | Temp 98.4°F | Resp 16 | Ht 64.0 in | Wt 148.0 lb

## 2024-05-02 DIAGNOSIS — C3431 Malignant neoplasm of lower lobe, right bronchus or lung: Secondary | ICD-10-CM

## 2024-05-02 DIAGNOSIS — Z5111 Encounter for antineoplastic chemotherapy: Secondary | ICD-10-CM | POA: Diagnosis not present

## 2024-05-02 DIAGNOSIS — D61818 Other pancytopenia: Secondary | ICD-10-CM | POA: Diagnosis not present

## 2024-05-02 DIAGNOSIS — R238 Other skin changes: Secondary | ICD-10-CM | POA: Diagnosis not present

## 2024-05-02 DIAGNOSIS — Z79899 Other long term (current) drug therapy: Secondary | ICD-10-CM | POA: Diagnosis not present

## 2024-05-02 DIAGNOSIS — K5900 Constipation, unspecified: Secondary | ICD-10-CM | POA: Diagnosis not present

## 2024-05-02 DIAGNOSIS — Z5112 Encounter for antineoplastic immunotherapy: Secondary | ICD-10-CM | POA: Diagnosis not present

## 2024-05-02 DIAGNOSIS — C7972 Secondary malignant neoplasm of left adrenal gland: Secondary | ICD-10-CM | POA: Diagnosis not present

## 2024-05-02 DIAGNOSIS — R918 Other nonspecific abnormal finding of lung field: Secondary | ICD-10-CM

## 2024-05-02 LAB — CMP (CANCER CENTER ONLY)
ALT: 7 U/L (ref 0–44)
AST: 22 U/L (ref 15–41)
Albumin: 3.1 g/dL — ABNORMAL LOW (ref 3.5–5.0)
Alkaline Phosphatase: 50 U/L (ref 38–126)
Anion gap: 6 (ref 5–15)
BUN: 23 mg/dL (ref 8–23)
CO2: 27 mmol/L (ref 22–32)
Calcium: 8.9 mg/dL (ref 8.9–10.3)
Chloride: 107 mmol/L (ref 98–111)
Creatinine: 1.74 mg/dL — ABNORMAL HIGH (ref 0.44–1.00)
GFR, Estimated: 30 mL/min — ABNORMAL LOW (ref >60)
Glucose, Bld: 91 mg/dL (ref 70–99)
Potassium: 3.5 mmol/L (ref 3.5–5.1)
Sodium: 140 mmol/L (ref 135–145)
Total Bilirubin: 0.5 mg/dL (ref 0.0–1.2)
Total Protein: 7.6 g/dL (ref 6.5–8.1)

## 2024-05-02 LAB — CBC WITH DIFFERENTIAL (CANCER CENTER ONLY)
Abs Immature Granulocytes: 0.01 10*3/uL (ref 0.00–0.07)
Basophils Absolute: 0 10*3/uL (ref 0.0–0.1)
Basophils Relative: 0 %
Eosinophils Absolute: 0 10*3/uL (ref 0.0–0.5)
Eosinophils Relative: 0 %
HCT: 27.4 % — ABNORMAL LOW (ref 36.0–46.0)
Hemoglobin: 8.8 g/dL — ABNORMAL LOW (ref 12.0–15.0)
Immature Granulocytes: 0 %
Lymphocytes Relative: 25 %
Lymphs Abs: 1.8 10*3/uL (ref 0.7–4.0)
MCH: 30.9 pg (ref 26.0–34.0)
MCHC: 32.1 g/dL (ref 30.0–36.0)
MCV: 96.1 fL (ref 80.0–100.0)
Monocytes Absolute: 0.9 10*3/uL (ref 0.1–1.0)
Monocytes Relative: 12 %
Neutro Abs: 4.5 10*3/uL (ref 1.7–7.7)
Neutrophils Relative %: 63 %
Platelet Count: 177 10*3/uL (ref 150–400)
RBC: 2.85 MIL/uL — ABNORMAL LOW (ref 3.87–5.11)
RDW: 18.1 % — ABNORMAL HIGH (ref 11.5–15.5)
WBC Count: 7.3 10*3/uL (ref 4.0–10.5)
nRBC: 0 % (ref 0.0–0.2)

## 2024-05-02 LAB — SAMPLE TO BLOOD BANK

## 2024-05-02 MED ORDER — SODIUM CHLORIDE 0.9% FLUSH
10.0000 mL | Freq: Once | INTRAVENOUS | Status: AC
Start: 2024-05-02 — End: 2024-05-02
  Administered 2024-05-02: 10 mL

## 2024-05-02 MED ORDER — SODIUM CHLORIDE 0.9 % IV SOLN
200.0000 mg | Freq: Once | INTRAVENOUS | Status: AC
  Administered 2024-05-02: 200 mg via INTRAVENOUS
  Filled 2024-05-02: qty 8

## 2024-05-02 MED ORDER — PROCHLORPERAZINE MALEATE 10 MG PO TABS
10.0000 mg | ORAL_TABLET | Freq: Once | ORAL | Status: AC
  Administered 2024-05-02: 10 mg via ORAL
  Filled 2024-05-02: qty 1

## 2024-05-02 MED ORDER — SODIUM CHLORIDE 0.9 % IV SOLN: INTRAVENOUS | Status: DC

## 2024-05-02 NOTE — Progress Notes (Signed)
 [facility] [phone]   [phone]  OFFICE PROGRESS NOTE  [name], MD [address], [address] [place] [address]  DIAGNOSIS: stage IV (T4, N2, M1 B) non-small cell lung cancer, adenocarcinoma presented with large right infrahilar mass in addition to right hilar and mediastinal lymphadenopathy as well as left adrenal gland metastasis diagnosed in December 2024.   Biomarker Findings Tumor Mutational Burden - 24 Muts/Mb HRD signature - HRDsig Negative Microsatellite status - MS-Stable Genomic Findings For a complete list of the genes assayed, please refer to the Appendix. BRAF G464V KRAS G13C ASXL1 deletion exon 12 TP53 splice site 920-2A>T 6 Disease relevant genes with no reportable alterations: ALK, EGFR, ERBB2, MET, RET, ROS1  PDL1 Expression: 90%  PRIOR THERAPY: None  CURRENT THERAPY: Systemic treatment with combination of chemoimmunotherapy with carboplatin  for AUC of 5, Alimta 500 Mg/M2 and Keytruda  200 Mg IV every 3 weeks.  First dose December 08, 2023.  Status post 5 cycles.  Starting from cycle #3 her dose of carboplatin  will be for AUC of 4 and Alimta 400 Mg/M2 secondary to pancytopenia.  Starting cycle #5 she was on maintenance treatment with Alimta and Keytruda  every 3 weeks.  Starting from cycle #6 she will be on single agent Keytruda  every 3 weeks.  Alimta discontinued secondary to toxicity.  INTERVAL HISTORY: Cherise Fedder Boateng 78 y.o. female returns to the clinic today for follow-up visit accompanied by her daughter. Discussed the use of AI scribe software for clinical note transcription with the patient, who gave verbal consent to proceed.  History of Present Illness   Tenae Graziosi Delahoussaye is a 78 year old female with non-small cell lung cancer who presents for evaluation with repeat CT scan for restaging of her disease. She is accompanied by her daughter.  She is currently undergoing treatment for non-small cell lung cancer with Keytruda   monotherapy. Previously, she was treated with a combination of  Carboplatin , Alimta and Keytruda , followed by Keytruda  alone. She finds the current treatment regimen tolerable, with no significant side effects except for mild constipation, which is managed with Colace.  Two days ago, she experienced a fall from the side of her bed, witnessed by her daughter. She did not sustain any injuries and attributes the fall to possibly slipping while putting down the phone.  She has noticed a change in the color of her skin, but denies any rash or diarrhea. She has experienced some weight loss, though it is not significant. No rash or diarrhea. Constipation is managed with a stool softener.       MEDICAL HISTORY: Past Medical History:  Diagnosis Date   Dyspnea    History of kidney stones    HTN (hypertension)    Smoker     ALLERGIES:  has no known allergies.  MEDICATIONS:  Current Outpatient Medications  Medication Sig Dispense Refill   amLODipine (NORVASC) 10 MG tablet 1 tablet     cephALEXin  (KEFLEX ) 250 MG capsule Take 1 capsule (250 mg total) by mouth 3 (three) times daily. 15 capsule 0   Cholecalciferol (VITAMIN D3) 1.25 MG (50000 UT) CAPS Take 1 capsule by mouth once a week.     folic acid  (FOLVITE ) 1 MG tablet Take 1 tablet (1 mg total) by mouth daily. Start 7 days before pemetrexed  chemotherapy. Continue until 21 days after pemetrexed  completed. 100 tablet 3   lidocaine -prilocaine  (EMLA ) cream Apply to affected area once 30 g 3   LORazepam  (ATIVAN ) 0.5  MG tablet Take 1 tablet by mouth 30-60 minutes before your scan 2 tablet 0   ondansetron  (ZOFRAN ) 8 MG tablet Take 1 tablet (8 mg total) by mouth every 8 (eight) hours as needed for nausea or vomiting. Start on the third Wolter after carboplatin . (Patient not taking: Reported on 04/11/2024) 30 tablet 1   prochlorperazine  (COMPAZINE ) 10 MG tablet Take 1 tablet (10 mg total) by mouth every 6 (six) hours as needed for nausea or vomiting. 30 tablet 1    RESTASIS 0.05 % ophthalmic emulsion Place 1 drop into both eyes 2 (two) times daily.     No current facility-administered medications for this visit.    SURGICAL HISTORY:  Past Surgical History:  Procedure Laterality Date   BRONCHIAL NEEDLE ASPIRATION BIOPSY  10/24/2023   Procedure: BRONCHIAL NEEDLE ASPIRATION BIOPSIES;  Surgeon: Brenna Adine CROME, DO;  Location: [facility];  Service: Pulmonary;;   COLONOSCOPY     IR IMAGING GUIDED PORT INSERTION  12/09/2023   PILONIDAL CYST EXCISION     tonisllectomy     VIDEO BRONCHOSCOPY WITH ENDOBRONCHIAL ULTRASOUND Right 10/24/2023   Procedure: VIDEO BRONCHOSCOPY WITH ENDOBRONCHIAL ULTRASOUND;  Surgeon: Brenna Adine CROME, DO;  Location: [facility];  Service: Pulmonary;  Laterality: Right;    REVIEW OF SYSTEMS:  Constitutional: positive for fatigue and weight loss Eyes: negative Ears, nose, mouth, throat, and face: negative Respiratory: negative Cardiovascular: negative Gastrointestinal: negative Genitourinary:negative Integument/breast: negative Hematologic/lymphatic: negative Musculoskeletal:positive for arthralgias Neurological: negative Behavioral/Psych: negative Endocrine: negative Allergic/Immunologic: negative   PHYSICAL EXAMINATION: General appearance: alert, cooperative, fatigued, and no distress Head: Normocephalic, without obvious abnormality, atraumatic Neck: no adenopathy, no JVD, supple, symmetrical, trachea midline, and thyroid  not enlarged, symmetric, no tenderness/mass/nodules Lymph nodes: Cervical, supraclavicular, and axillary nodes normal. Resp: clear to auscultation bilaterally Back: symmetric, no curvature. ROM normal. No CVA tenderness. Cardio: regular rate and rhythm, S1, S2 normal, no murmur, click, rub or gallop GI: soft, non-tender; bowel sounds normal; no masses,  no organomegaly Extremities: extremities normal, atraumatic, no cyanosis or edema Neurologic: Alert and oriented X 3, normal strength and tone.  Normal symmetric reflexes. Normal coordination and gait  ECOG PERFORMANCE STATUS: 1 - Symptomatic but completely ambulatory  Blood pressure 113/72, pulse 71, temperature 98.4 F (36.9 C), temperature source Oral, resp. rate 16, height 5' 4 (1.626 m), weight 148 lb (67.1 kg), SpO2 96%.  LABORATORY DATA: Lab Results  Component Value Date   WBC 9.5 04/26/2024   HGB 9.9 (L) 04/26/2024   HCT 30.5 (L) 04/26/2024   MCV 96.2 04/26/2024   PLT 190 04/26/2024      Chemistry      Component Value Date/Time   NA 140 04/26/2024 0946   NA 142 10/12/2021 1043   K 3.7 04/26/2024 0946   CL 106 04/26/2024 0946   CO2 27 04/26/2024 0946   BUN 16 04/26/2024 0946   BUN 17 10/12/2021 1043   CREATININE 1.67 (H) 04/26/2024 0946      Component Value Date/Time   CALCIUM 9.0 04/26/2024 0946   ALKPHOS 61 04/26/2024 0946   AST 28 04/26/2024 0946   ALT 10 04/26/2024 0946   BILITOT 0.6 04/26/2024 0946       RADIOGRAPHIC STUDIES: CT CHEST ABDOMEN PELVIS WO CONTRAST Result Date: 05/01/2024 CLINICAL DATA:  Adenocarcinoma of right lower lobe. Evaluate for metastasis. * Tracking Code: BO * EXAM: CT CHEST, ABDOMEN AND PELVIS WITHOUT CONTRAST TECHNIQUE: Multidetector CT imaging of the chest, abdomen and pelvis was performed following the standard protocol without IV  contrast. RADIATION DOSE REDUCTION: This exam was performed according to the departmental dose-optimization program which includes automated exposure control, adjustment of the mA and/or kV according to patient size and/or use of iterative reconstruction technique. COMPARISON:  02/09/2024 FINDINGS: CT CHEST FINDINGS Cardiovascular: Right Port-A-Cath tip high right atrium. Aortic atherosclerosis. Ascending aortic aneurysm at 4.3 cm on coronal image 48. 4.2 cm previously. Tortuous thoracic aorta. Moderate cardiomegaly, with small pericardial effusion, increased. Three vessel coronary artery calcification. Pulmonary artery enlargement, outflow tract 3.8  cm. Mediastinum/Nodes: No mediastinal adenopathy. Hilar regions poorly evaluated without contrast. Lungs/Pleura: Small right pleural effusion with loculated anteromedial inferior component, similar. Similar right lower lobe endobronchial compression or inclusion. The underlying right lower lobe lung mass is difficult to delineate, especially secondary to lack of IV contrast. Estimated 3.0 x 2.9 cm on 32/2 versus 4.8 x 3.5 cm on the prior exam when measured in a similar fashion. Adjacent right lower lobe collapse again identified. Centrilobular emphysema. Mixed attenuation right upper lobe process demonstrates concurrent architectural distortion and may represent an area of scarring. Estimated at 3.3 x 2.4 cm on 51/4 versus 3.8 x 2.2 cm previously. 2 mm left lower lobe pulmonary nodule on 83/4 is not readily apparent on the prior. Right middle lobe presumed hamartoma similar at 2.1 cm on 32/2. Musculoskeletal: Included within the abdomen pelvic section. CT ABDOMEN PELVIS FINDINGS Hepatobiliary: Normal noncontrast appearance of the liver, gallbladder. No biliary duct dilatation. Pancreas: Normal, without mass or ductal dilatation. Spleen: Normal in size, without focal abnormality. Adrenals/Urinary Tract: Normal adrenal glands. Dominant interpolar left renal collecting system calculus of 1.6 cm. No right renal calculi or hydronephrosis. No bladder calculi. Stomach/Bowel: Normal stomach, without wall thickening. Normal colon and terminal ileum. Normal small bowel. Vascular/Lymphatic: Aortic atherosclerosis. Ectasia of the left greater than right common iliac arteries a 1.8 cm. No abdominopelvic adenopathy. Reproductive: Normal uterus and adnexa. Other: No free intraperitoneal air. No significant free fluid. Increased anasarca. Musculoskeletal: Moderate osteoarthritis of both hips. IMPRESSION: 1. Mild to moderately degraded evaluation secondary to lack of IV contrast and patient arm position, not raised above the head.  2. The right lower lobe lung mass is felt to be slightly smaller, but poorly evaluated. 3. No thoracic adenopathy. 4. No evidence of metastatic disease in the abdomen or pelvis. 5. Right upper lobe mixed attenuation lesion versus area of scarring, similar. 6. Similar small right pleural effusion with loculation anteriorly. 7. Similar right lower lobe endobronchial obstruction or compression with resultant volume loss. 8. Ascending aortic aneurysm at 4.3 cm. Recommend annual imaging followup by CTA or MRA. This recommendation follows 2010 ACCF/AHA/AATS/ACR/ASA/SCA/SCAI/SIR/STS/SVM Guidelines for the Diagnosis and Management of Patients with Thoracic Aortic Disease. Circulation. 2010; 121: Z733-z630. Aortic aneurysm NOS (ICD10-I71.9) 9. Incidental findings, including: Coronary artery atherosclerosis. Aortic Atherosclerosis (ICD10-I70.0). Pulmonary artery enlargement suggests pulmonary arterial hypertension. Left nephrolithiasis. Electronically Signed   By: Rockey Kilts M.D.   On: 05/01/2024 16:36   VAS US  LOWER EXTREMITY VENOUS (DVT) Result Date: 04/12/2024  Lower Venous DVT Study Patient Name:  ALLYCIA PITZ  Date of Exam:   04/12/2024 Medical Rec #: [id number]     Accession #:    [id number] Date of Birth: [birth year]     Patient Gender: F Patient Age:   46 years Exam Location:  [address] Procedure:      VAS US  LOWER EXTREMITY VENOUS (DVT) Referring Phys: CALTON HEILINGOETTER --------------------------------------------------------------------------------  Indications: Left ankle skin discoloration with swelling x 5 - 6 months. She denies chest pain and  SOB. Patient has lung cancer.  Comparison Study: None Performing Technologist: Alecia Mackin RVT, RDCS (AE), RDMS  Examination Guidelines: A complete evaluation includes B-mode imaging, spectral Doppler, color Doppler, and power Doppler as needed of all accessible portions of each vessel. Bilateral testing is considered an integral part of a complete examination.  Limited examinations for reoccurring indications may be performed as noted. The reflux portion of the exam is performed with the patient in reverse Trendelenburg.  +-----+---------------+---------+-----------+----------+--------------+ RIGHTCompressibilityPhasicitySpontaneityPropertiesThrombus Aging +-----+---------------+---------+-----------+----------+--------------+ CFV  Full           Yes      Yes                                 +-----+---------------+---------+-----------+----------+--------------+   +---------+---------------+---------+-----------+----------+--------------+ LEFT     CompressibilityPhasicitySpontaneityPropertiesThrombus Aging +---------+---------------+---------+-----------+----------+--------------+ CFV      Full           Yes      Yes                                 +---------+---------------+---------+-----------+----------+--------------+ SFJ      Full           Yes      Yes                                 +---------+---------------+---------+-----------+----------+--------------+ FV Prox  Full           Yes      Yes                                 +---------+---------------+---------+-----------+----------+--------------+ FV Mid   Full           Yes      Yes                                 +---------+---------------+---------+-----------+----------+--------------+ FV DistalFull           Yes      Yes                                 +---------+---------------+---------+-----------+----------+--------------+ PFV      Full                                                        +---------+---------------+---------+-----------+----------+--------------+ POP      Full           Yes      Yes                                 +---------+---------------+---------+-----------+----------+--------------+ PTV      Full           Yes      Yes                                  +---------+---------------+---------+-----------+----------+--------------+  PERO     Full           Yes      Yes                                 +---------+---------------+---------+-----------+----------+--------------+ Gastroc  Full                                                        +---------+---------------+---------+-----------+----------+--------------+ GSV      Full           Yes      Yes                                 +---------+---------------+---------+-----------+----------+--------------+    Findings reported to preliminary faxed to Dr. Vickii office at 12:45 pm.  Summary: RIGHT: - No evidence of common femoral vein obstruction.   LEFT: - No evidence of deep vein thrombosis in the lower extremity. No indirect evidence of obstruction proximal to the inguinal ligament.  - No cystic structure found in the popliteal fossa. - Mild superficial edema noted in the mid posteromedial calf and medial ankle.  *See table(s) above for measurements and observations. Electronically signed by Debby Robertson on 04/12/2024 at 4:57:13 PM.    Final      ASSESSMENT AND PLAN: This is a very pleasant 79 years old African-American female with  stage IV (T4, N2, M1 B) non-small cell lung cancer, adenocarcinoma presented with large right infrahilar mass in addition to right hilar and mediastinal lymphadenopathy as well as left adrenal gland metastasis diagnosed in December 2024.  She had molecular studies by [facility] that showed no actionable mutations and PD-L1 TPS of 90%. She is currently undergoing treatment with chemoimmunotherapy with carboplatin  for AUC of 5, Alimta 500 Mg/M2 and Keytruda  200 Mg IV every 3 weeks for 4 cycles followed by maintenance treatment with Alimta and Keytruda .  Status post 6 cycle.  Starting from cycle #3 her dose of carboplatin  was reduced to AUC of 4 and Alimta 400 Mg/M2 secondary to pancytopenia.  Starting cycle #6 she will be on single agent Keytruda  every 3  weeks.  Alimta was discontinued secondary to intolerance and persistent pancytopenia. The patient has been tolerating this treatment fairly well with no concerning adverse effects. She had repeat CT scan of the chest, abdomen and pelvis performed recently.  I personally and independently reviewed the scan and discussed the result with the patient and her daughter. Her scan showed no concerning findings for disease progression. Assessment and Plan    Stage IV Malignant neoplasm of lung The lung cancer is well-managed with no progression on the recent CT scan of the chest, abdomen, and pelvis. She is on Keytruda  (pembrolizumab ) monotherapy every three weeks, which she tolerates well without significant side effects. There is a change in skin color, likely due to immune therapy, but no intervention is required. She experienced a fall recently without sustaining injuries. - Continue Keytruda  every three weeks. - Monitor skin color changes, no intervention needed unless changes are significant. - Schedule next cycle of treatment in three weeks.  Vitiligo The change in skin color is likely related to immune therapy, presenting as vitiligo, a known  side effect of the treatment. No intervention is required unless it becomes bothersome. - Monitor skin changes, no treatment required unless changes become significant.  Constipation Constipation is a side effect managed effectively with a stool softener (Colace). - Continue using stool softener as needed for constipation.   The patient was advised to call immediately if she has any concerning symptoms in the interval. The patient voices understanding of current disease status and treatment options and is in agreement with the current care plan.  All questions were answered. The patient knows to call the clinic with any problems, questions or concerns. We can certainly see the patient much sooner if necessary.  The total time spent in the appointment was  30 minutes.  Disclaimer: This note was dictated with voice recognition software. Similar sounding words can inadvertently be transcribed and may not be corrected upon review.

## 2024-05-02 NOTE — Patient Instructions (Signed)

## 2024-05-03 ENCOUNTER — Telehealth: Payer: Self-pay | Admitting: Medical Oncology

## 2024-05-03 ENCOUNTER — Inpatient Hospital Stay

## 2024-05-03 NOTE — Telephone Encounter (Signed)
 Cancelled flush appt because pt had labs yesterday.

## 2024-05-07 ENCOUNTER — Telehealth: Payer: Self-pay

## 2024-05-07 NOTE — Telephone Encounter (Signed)
 T/C from pt's daughter stating pt has been taking a stool softener and Miralax for constipation with little to no relief.  Daughter advised to try  OTC mag citrate starting with 1/2 bottle and if no relief she can drink the other half  She can also add senokot/senna plus to her nightly routine starting with 2 tablets  She agreed to this plan of care and will call back if still no relief.

## 2024-05-10 ENCOUNTER — Other Ambulatory Visit

## 2024-05-10 ENCOUNTER — Ambulatory Visit: Admitting: Internal Medicine

## 2024-05-10 ENCOUNTER — Ambulatory Visit

## 2024-05-10 ENCOUNTER — Encounter: Admitting: Nutrition

## 2024-05-10 ENCOUNTER — Inpatient Hospital Stay: Attending: Internal Medicine

## 2024-05-10 VITALS — BP 136/89 | HR 78 | Temp 98.5°F | Resp 17

## 2024-05-10 DIAGNOSIS — C3431 Malignant neoplasm of lower lobe, right bronchus or lung: Secondary | ICD-10-CM | POA: Diagnosis not present

## 2024-05-10 DIAGNOSIS — Z87891 Personal history of nicotine dependence: Secondary | ICD-10-CM | POA: Insufficient documentation

## 2024-05-10 DIAGNOSIS — Z79899 Other long term (current) drug therapy: Secondary | ICD-10-CM | POA: Insufficient documentation

## 2024-05-10 DIAGNOSIS — C7972 Secondary malignant neoplasm of left adrenal gland: Secondary | ICD-10-CM | POA: Insufficient documentation

## 2024-05-10 DIAGNOSIS — D61818 Other pancytopenia: Secondary | ICD-10-CM | POA: Diagnosis not present

## 2024-05-10 DIAGNOSIS — R918 Other nonspecific abnormal finding of lung field: Secondary | ICD-10-CM

## 2024-05-10 DIAGNOSIS — Z5112 Encounter for antineoplastic immunotherapy: Secondary | ICD-10-CM | POA: Insufficient documentation

## 2024-05-10 LAB — CMP (CANCER CENTER ONLY)
ALT: 8 U/L (ref 0–44)
AST: 24 U/L (ref 15–41)
Albumin: 3.2 g/dL — ABNORMAL LOW (ref 3.5–5.0)
Alkaline Phosphatase: 54 U/L (ref 38–126)
Anion gap: 5 (ref 5–15)
BUN: 22 mg/dL (ref 8–23)
CO2: 30 mmol/L (ref 22–32)
Calcium: 9.1 mg/dL (ref 8.9–10.3)
Chloride: 106 mmol/L (ref 98–111)
Creatinine: 1.71 mg/dL — ABNORMAL HIGH (ref 0.44–1.00)
GFR, Estimated: 30 mL/min — ABNORMAL LOW (ref >60)
Glucose, Bld: 97 mg/dL (ref 70–99)
Potassium: 4.1 mmol/L (ref 3.5–5.1)
Sodium: 141 mmol/L (ref 135–145)
Total Bilirubin: 0.4 mg/dL (ref 0.0–1.2)
Total Protein: 7.8 g/dL (ref 6.5–8.1)

## 2024-05-10 LAB — CBC WITH DIFFERENTIAL (CANCER CENTER ONLY)
Abs Immature Granulocytes: 0.03 10*3/uL (ref 0.00–0.07)
Basophils Absolute: 0 10*3/uL (ref 0.0–0.1)
Basophils Relative: 0 %
Eosinophils Absolute: 0 10*3/uL (ref 0.0–0.5)
Eosinophils Relative: 0 %
HCT: 28.4 % — ABNORMAL LOW (ref 36.0–46.0)
Hemoglobin: 9.2 g/dL — ABNORMAL LOW (ref 12.0–15.0)
Immature Granulocytes: 0 %
Lymphocytes Relative: 25 %
Lymphs Abs: 1.8 10*3/uL (ref 0.7–4.0)
MCH: 31.1 pg (ref 26.0–34.0)
MCHC: 32.4 g/dL (ref 30.0–36.0)
MCV: 95.9 fL (ref 80.0–100.0)
Monocytes Absolute: 0.9 10*3/uL (ref 0.1–1.0)
Monocytes Relative: 13 %
Neutro Abs: 4.4 10*3/uL (ref 1.7–7.7)
Neutrophils Relative %: 62 %
Platelet Count: 209 10*3/uL (ref 150–400)
RBC: 2.96 MIL/uL — ABNORMAL LOW (ref 3.87–5.11)
RDW: 16.9 % — ABNORMAL HIGH (ref 11.5–15.5)
WBC Count: 7.1 10*3/uL (ref 4.0–10.5)
nRBC: 0 % (ref 0.0–0.2)

## 2024-05-10 MED ORDER — HEPARIN SOD (PORK) LOCK FLUSH 100 UNIT/ML IV SOLN
500.0000 [IU] | Freq: Once | INTRAVENOUS | Status: AC
Start: 2024-05-10 — End: 2024-05-10
  Administered 2024-05-10: 500 [IU]

## 2024-05-10 MED ORDER — SODIUM CHLORIDE 0.9% FLUSH
10.0000 mL | Freq: Once | INTRAVENOUS | Status: AC
  Administered 2024-05-10: 10 mL

## 2024-05-17 ENCOUNTER — Inpatient Hospital Stay

## 2024-05-17 DIAGNOSIS — Z5112 Encounter for antineoplastic immunotherapy: Secondary | ICD-10-CM | POA: Diagnosis not present

## 2024-05-17 DIAGNOSIS — R918 Other nonspecific abnormal finding of lung field: Secondary | ICD-10-CM

## 2024-05-17 DIAGNOSIS — C7972 Secondary malignant neoplasm of left adrenal gland: Secondary | ICD-10-CM | POA: Diagnosis not present

## 2024-05-17 DIAGNOSIS — C3431 Malignant neoplasm of lower lobe, right bronchus or lung: Secondary | ICD-10-CM

## 2024-05-17 DIAGNOSIS — Z87891 Personal history of nicotine dependence: Secondary | ICD-10-CM | POA: Diagnosis not present

## 2024-05-17 DIAGNOSIS — Z79899 Other long term (current) drug therapy: Secondary | ICD-10-CM | POA: Diagnosis not present

## 2024-05-17 DIAGNOSIS — D61818 Other pancytopenia: Secondary | ICD-10-CM | POA: Diagnosis not present

## 2024-05-17 LAB — CMP (CANCER CENTER ONLY)
ALT: 7 U/L (ref 0–44)
AST: 22 U/L (ref 15–41)
Albumin: 3.2 g/dL — ABNORMAL LOW (ref 3.5–5.0)
Alkaline Phosphatase: 60 U/L (ref 38–126)
Anion gap: 7 (ref 5–15)
BUN: 19 mg/dL (ref 8–23)
CO2: 28 mmol/L (ref 22–32)
Calcium: 9.1 mg/dL (ref 8.9–10.3)
Chloride: 106 mmol/L (ref 98–111)
Creatinine: 1.81 mg/dL — ABNORMAL HIGH (ref 0.44–1.00)
GFR, Estimated: 28 mL/min — ABNORMAL LOW (ref >60)
Glucose, Bld: 90 mg/dL (ref 70–99)
Potassium: 4 mmol/L (ref 3.5–5.1)
Sodium: 141 mmol/L (ref 135–145)
Total Bilirubin: 0.4 mg/dL (ref 0.0–1.2)
Total Protein: 8.1 g/dL (ref 6.5–8.1)

## 2024-05-17 LAB — CBC WITH DIFFERENTIAL (CANCER CENTER ONLY)
Abs Immature Granulocytes: 0.02 K/uL (ref 0.00–0.07)
Basophils Absolute: 0 K/uL (ref 0.0–0.1)
Basophils Relative: 0 %
Eosinophils Absolute: 0.1 K/uL (ref 0.0–0.5)
Eosinophils Relative: 1 %
HCT: 29.8 % — ABNORMAL LOW (ref 36.0–46.0)
Hemoglobin: 9.4 g/dL — ABNORMAL LOW (ref 12.0–15.0)
Immature Granulocytes: 0 %
Lymphocytes Relative: 31 %
Lymphs Abs: 2 K/uL (ref 0.7–4.0)
MCH: 29.9 pg (ref 26.0–34.0)
MCHC: 31.5 g/dL (ref 30.0–36.0)
MCV: 94.9 fL (ref 80.0–100.0)
Monocytes Absolute: 0.8 K/uL (ref 0.1–1.0)
Monocytes Relative: 12 %
Neutro Abs: 3.6 K/uL (ref 1.7–7.7)
Neutrophils Relative %: 56 %
Platelet Count: 175 K/uL (ref 150–400)
RBC: 3.14 MIL/uL — ABNORMAL LOW (ref 3.87–5.11)
RDW: 16.1 % — ABNORMAL HIGH (ref 11.5–15.5)
WBC Count: 6.5 K/uL (ref 4.0–10.5)
nRBC: 0 % (ref 0.0–0.2)

## 2024-05-17 LAB — SAMPLE TO BLOOD BANK

## 2024-05-17 MED ORDER — SODIUM CHLORIDE 0.9% FLUSH
10.0000 mL | Freq: Once | INTRAVENOUS | Status: AC
Start: 2024-05-17 — End: 2024-05-17
  Administered 2024-05-17: 10 mL

## 2024-05-17 MED ORDER — HEPARIN SOD (PORK) LOCK FLUSH 100 UNIT/ML IV SOLN
500.0000 [IU] | Freq: Once | INTRAVENOUS | Status: AC
  Administered 2024-05-17: 500 [IU]

## 2024-05-23 ENCOUNTER — Other Ambulatory Visit: Payer: Self-pay

## 2024-05-24 ENCOUNTER — Inpatient Hospital Stay

## 2024-05-24 ENCOUNTER — Inpatient Hospital Stay: Admitting: Internal Medicine

## 2024-05-24 ENCOUNTER — Inpatient Hospital Stay: Admitting: Nutrition

## 2024-05-24 VITALS — BP 128/78 | HR 67 | Temp 97.6°F | Resp 16 | Ht 64.0 in | Wt 151.0 lb

## 2024-05-24 DIAGNOSIS — D6481 Anemia due to antineoplastic chemotherapy: Secondary | ICD-10-CM

## 2024-05-24 DIAGNOSIS — C3431 Malignant neoplasm of lower lobe, right bronchus or lung: Secondary | ICD-10-CM

## 2024-05-24 DIAGNOSIS — Z79899 Other long term (current) drug therapy: Secondary | ICD-10-CM | POA: Diagnosis not present

## 2024-05-24 DIAGNOSIS — R918 Other nonspecific abnormal finding of lung field: Secondary | ICD-10-CM

## 2024-05-24 DIAGNOSIS — C7972 Secondary malignant neoplasm of left adrenal gland: Secondary | ICD-10-CM | POA: Diagnosis not present

## 2024-05-24 DIAGNOSIS — Z87891 Personal history of nicotine dependence: Secondary | ICD-10-CM | POA: Diagnosis not present

## 2024-05-24 DIAGNOSIS — D61818 Other pancytopenia: Secondary | ICD-10-CM | POA: Diagnosis not present

## 2024-05-24 DIAGNOSIS — Z5112 Encounter for antineoplastic immunotherapy: Secondary | ICD-10-CM | POA: Diagnosis not present

## 2024-05-24 LAB — CMP (CANCER CENTER ONLY)
ALT: 6 U/L (ref 0–44)
AST: 23 U/L (ref 15–41)
Albumin: 3.3 g/dL — ABNORMAL LOW (ref 3.5–5.0)
Alkaline Phosphatase: 61 U/L (ref 38–126)
Anion gap: 7 (ref 5–15)
BUN: 26 mg/dL — ABNORMAL HIGH (ref 8–23)
CO2: 29 mmol/L (ref 22–32)
Calcium: 9.1 mg/dL (ref 8.9–10.3)
Chloride: 106 mmol/L (ref 98–111)
Creatinine: 2.1 mg/dL — ABNORMAL HIGH (ref 0.44–1.00)
GFR, Estimated: 24 mL/min — ABNORMAL LOW (ref >60)
Glucose, Bld: 90 mg/dL (ref 70–99)
Potassium: 4.2 mmol/L (ref 3.5–5.1)
Sodium: 142 mmol/L (ref 135–145)
Total Bilirubin: 0.5 mg/dL (ref 0.0–1.2)
Total Protein: 8 g/dL (ref 6.5–8.1)

## 2024-05-24 LAB — CBC WITH DIFFERENTIAL (CANCER CENTER ONLY)
Abs Immature Granulocytes: 0.02 K/uL (ref 0.00–0.07)
Basophils Absolute: 0 K/uL (ref 0.0–0.1)
Basophils Relative: 0 %
Eosinophils Absolute: 0.1 K/uL (ref 0.0–0.5)
Eosinophils Relative: 1 %
HCT: 28.4 % — ABNORMAL LOW (ref 36.0–46.0)
Hemoglobin: 9.2 g/dL — ABNORMAL LOW (ref 12.0–15.0)
Immature Granulocytes: 0 %
Lymphocytes Relative: 38 %
Lymphs Abs: 2.1 K/uL (ref 0.7–4.0)
MCH: 30.4 pg (ref 26.0–34.0)
MCHC: 32.4 g/dL (ref 30.0–36.0)
MCV: 93.7 fL (ref 80.0–100.0)
Monocytes Absolute: 0.6 K/uL (ref 0.1–1.0)
Monocytes Relative: 11 %
Neutro Abs: 2.8 K/uL (ref 1.7–7.7)
Neutrophils Relative %: 50 %
Platelet Count: 174 K/uL (ref 150–400)
RBC: 3.03 MIL/uL — ABNORMAL LOW (ref 3.87–5.11)
RDW: 15.5 % (ref 11.5–15.5)
WBC Count: 5.6 K/uL (ref 4.0–10.5)
nRBC: 0 % (ref 0.0–0.2)

## 2024-05-24 LAB — SAMPLE TO BLOOD BANK

## 2024-05-24 MED ORDER — SODIUM CHLORIDE 0.9% FLUSH
10.0000 mL | INTRAVENOUS | Status: DC | PRN
  Administered 2024-05-24: 10 mL

## 2024-05-24 MED ORDER — PROCHLORPERAZINE MALEATE 10 MG PO TABS
10.0000 mg | ORAL_TABLET | Freq: Once | ORAL | Status: AC
  Administered 2024-05-24: 10 mg via ORAL
  Filled 2024-05-24: qty 1

## 2024-05-24 MED ORDER — HEPARIN SOD (PORK) LOCK FLUSH 100 UNIT/ML IV SOLN
500.0000 [IU] | Freq: Once | INTRAVENOUS | Status: AC | PRN
  Administered 2024-05-24: 500 [IU]

## 2024-05-24 MED ORDER — SODIUM CHLORIDE 0.9 % IV SOLN
200.0000 mg | Freq: Once | INTRAVENOUS | Status: AC
  Administered 2024-05-24: 200 mg via INTRAVENOUS
  Filled 2024-05-24: qty 8

## 2024-05-24 MED ORDER — SODIUM CHLORIDE 0.9 % IV SOLN
INTRAVENOUS | Status: DC
Start: 2024-05-24 — End: 2024-05-24

## 2024-05-24 MED ORDER — SODIUM CHLORIDE 0.9% FLUSH
10.0000 mL | Freq: Once | INTRAVENOUS | Status: AC
Start: 2024-05-24 — End: 2024-05-24
  Administered 2024-05-24: 10 mL

## 2024-05-24 NOTE — Progress Notes (Signed)
 This RN received verbal OK to proceed with tx from Dr. Sherrod today for SCR 2.10 mg/dL.

## 2024-05-24 NOTE — Progress Notes (Signed)
 [facility] [phone]   Fax:([phone]  OFFICE PROGRESS NOTE  [name], MD [address], [address] [place] [address]  DIAGNOSIS: stage IV (T4, N2, M1 B) non-small cell lung cancer, adenocarcinoma presented with large right infrahilar mass in addition to right hilar and mediastinal lymphadenopathy as well as left adrenal gland metastasis diagnosed in December 2024.   Biomarker Findings Tumor Mutational Burden - 24 Muts/Mb HRD signature - HRDsig Negative Microsatellite status - MS-Stable Genomic Findings For a complete list of the genes assayed, please refer to the Appendix. BRAF G464V KRAS G13C ASXL1 deletion exon 12 TP53 splice site 920-2A>T 6 Disease relevant genes with no reportable alterations: ALK, EGFR, ERBB2, MET, RET, ROS1  PDL1 Expression: 90%  PRIOR THERAPY: None  CURRENT THERAPY: Systemic treatment with combination of chemoimmunotherapy with carboplatin  for AUC of 5, Alimta  500 Mg/M2 and Keytruda  200 Mg IV every 3 weeks.  First dose December 08, 2023.  Status post 7 cycles.  Starting from cycle #3 her dose of carboplatin  will be for AUC of 4 and Alimta  400 Mg/M2 secondary to pancytopenia.  Starting cycle #5 she was on maintenance treatment with Alimta  and Keytruda  every 3 weeks.  Starting from cycle #6 she is on treatment with single agent Keytruda  every 3 weeks.  Alimta  discontinued secondary to toxicity.  INTERVAL HISTORY: Tami Shea 78 y.o. female returns to the clinic today for follow-up visit accompanied by her son.Discussed the use of AI scribe software for clinical note transcription with the patient, who gave verbal consent to proceed.  History of Present Illness Tami Shea is a 78 year old female with adenocarcinoma who presents for evaluation before starting cycle eight of her treatment. She is accompanied by her oldest son.  She has been undergoing treatment for adenocarcinoma, initially receiving four cycles of  chemotherapy followed by maintenance treatment with Alimta  and Keytruda . Alimta  was discontinued after cycle six due to intolerance, and she has been on single-agent Keytruda  since then. She has completed a total of seven cycles and is here for evaluation before starting cycle eight.  She feels better since being on Keytruda  alone, noting an improvement in her overall well-being. She mentions feeling better even upon waking up, attributing this to getting used to the treatment.  No nausea, vomiting, diarrhea, or headaches. However, she reports constipation, which she believes is being managed with dietary adjustments, including consuming more soluble foods, with the help of her son.  Her breathing is reported to be 'pretty good'.     MEDICAL HISTORY: Past Medical History:  Diagnosis Date   Dyspnea    History of kidney stones    HTN (hypertension)    Smoker     ALLERGIES:  has no known allergies.  MEDICATIONS:  Current Outpatient Medications  Medication Sig Dispense Refill   amLODipine (NORVASC) 10 MG tablet 1 tablet     cephALEXin  (KEFLEX ) 250 MG capsule Take 1 capsule (250 mg total) by mouth 3 (three) times daily. 15 capsule 0   Cholecalciferol (VITAMIN D3) 1.25 MG (50000 UT) CAPS Take 1 capsule by mouth once a week.     folic acid  (FOLVITE ) 1 MG tablet Take 1 tablet (1 mg total) by mouth daily. Start 7 days before pemetrexed  chemotherapy. Continue until 21 days after pemetrexed  completed. 100 tablet 3   lidocaine -prilocaine  (EMLA ) cream Apply to affected area once 30 g 3   LORazepam  (ATIVAN ) 0.5 MG tablet Take 1 tablet by mouth  30-60 minutes before your scan 2 tablet 0   ondansetron  (ZOFRAN ) 8 MG tablet Take 1 tablet (8 mg total) by mouth every 8 (eight) hours as needed for nausea or vomiting. Start on the third [place] after carboplatin . (Patient not taking: Reported on 04/11/2024) 30 tablet 1   prochlorperazine  (COMPAZINE ) 10 MG tablet Take 1 tablet (10 mg total) by mouth every 6 (six)  hours as needed for nausea or vomiting. 30 tablet 1   RESTASIS 0.05 % ophthalmic emulsion Place 1 drop into both eyes 2 (two) times daily.     No current facility-administered medications for this visit.    SURGICAL HISTORY:  Past Surgical History:  Procedure Laterality Date   BRONCHIAL NEEDLE ASPIRATION BIOPSY  10/24/2023   Procedure: BRONCHIAL NEEDLE ASPIRATION BIOPSIES;  Surgeon: Brenna Adine CROME, DO;  Location: [facility];  Service: Pulmonary;;   COLONOSCOPY     IR IMAGING GUIDED PORT INSERTION  12/09/2023   PILONIDAL CYST EXCISION     tonisllectomy     VIDEO BRONCHOSCOPY WITH ENDOBRONCHIAL ULTRASOUND Right 10/24/2023   Procedure: VIDEO BRONCHOSCOPY WITH ENDOBRONCHIAL ULTRASOUND;  Surgeon: Brenna Adine CROME, DO;  Location: [facility];  Service: Pulmonary;  Laterality: Right;    REVIEW OF SYSTEMS:  A comprehensive review of systems was negative except for: Constitutional: positive for fatigue Gastrointestinal: positive for constipation   PHYSICAL EXAMINATION: General appearance: alert, cooperative, fatigued, and no distress Head: Normocephalic, without obvious abnormality, atraumatic Neck: no adenopathy, no JVD, supple, symmetrical, trachea midline, and thyroid  not enlarged, symmetric, no tenderness/mass/nodules Lymph nodes: Cervical, supraclavicular, and axillary nodes normal. Resp: clear to auscultation bilaterally Back: symmetric, no curvature. ROM normal. No CVA tenderness. Cardio: regular rate and rhythm, S1, S2 normal, no murmur, click, rub or gallop GI: soft, non-tender; bowel sounds normal; no masses,  no organomegaly Extremities: extremities normal, atraumatic, no cyanosis or edema  ECOG PERFORMANCE STATUS: 1 - Symptomatic but completely ambulatory  Blood pressure 128/78, pulse 67, temperature 97.6 F (36.4 C), temperature source Temporal, resp. rate 16, height 5' 4 (1.626 m), weight 151 lb (68.5 kg), SpO2 100%.  LABORATORY DATA: Lab Results  Component Value Date    WBC 6.5 05/17/2024   HGB 9.4 (L) 05/17/2024   HCT 29.8 (L) 05/17/2024   MCV 94.9 05/17/2024   PLT 175 05/17/2024      Chemistry      Component Value Date/Time   NA 141 05/17/2024 0929   NA 142 10/12/2021 1043   K 4.0 05/17/2024 0929   CL 106 05/17/2024 0929   CO2 28 05/17/2024 0929   BUN 19 05/17/2024 0929   BUN 17 10/12/2021 1043   CREATININE 1.81 (H) 05/17/2024 0929      Component Value Date/Time   CALCIUM 9.1 05/17/2024 0929   ALKPHOS 60 05/17/2024 0929   AST 22 05/17/2024 0929   ALT 7 05/17/2024 0929   BILITOT 0.4 05/17/2024 0929       RADIOGRAPHIC STUDIES: CT CHEST ABDOMEN PELVIS WO CONTRAST Result Date: 05/01/2024 CLINICAL DATA:  Adenocarcinoma of right lower lobe. Evaluate for metastasis. * Tracking Code: BO * EXAM: CT CHEST, ABDOMEN AND PELVIS WITHOUT CONTRAST TECHNIQUE: Multidetector CT imaging of the chest, abdomen and pelvis was performed following the standard protocol without IV contrast. RADIATION DOSE REDUCTION: This exam was performed according to the departmental dose-optimization program which includes automated exposure control, adjustment of the mA and/or kV according to patient size and/or use of iterative reconstruction technique. COMPARISON:  02/09/2024 FINDINGS: CT CHEST FINDINGS Cardiovascular: Right Port-A-Cath  tip high right atrium. Aortic atherosclerosis. Ascending aortic aneurysm at 4.3 cm on coronal image 48. 4.2 cm previously. Tortuous thoracic aorta. Moderate cardiomegaly, with small pericardial effusion, increased. Three vessel coronary artery calcification. Pulmonary artery enlargement, outflow tract 3.8 cm. Mediastinum/Nodes: No mediastinal adenopathy. Hilar regions poorly evaluated without contrast. Lungs/Pleura: Small right pleural effusion with loculated anteromedial inferior component, similar. Similar right lower lobe endobronchial compression or inclusion. The underlying right lower lobe lung mass is difficult to delineate, especially  secondary to lack of IV contrast. Estimated 3.0 x 2.9 cm on 32/2 versus 4.8 x 3.5 cm on the prior exam when measured in a similar fashion. Adjacent right lower lobe collapse again identified. Centrilobular emphysema. Mixed attenuation right upper lobe process demonstrates concurrent architectural distortion and may represent an area of scarring. Estimated at 3.3 x 2.4 cm on 51/4 versus 3.8 x 2.2 cm previously. 2 mm left lower lobe pulmonary nodule on 83/4 is not readily apparent on the prior. Right middle lobe presumed hamartoma similar at 2.1 cm on 32/2. Musculoskeletal: Included within the abdomen pelvic section. CT ABDOMEN PELVIS FINDINGS Hepatobiliary: Normal noncontrast appearance of the liver, gallbladder. No biliary duct dilatation. Pancreas: Normal, without mass or ductal dilatation. Spleen: Normal in size, without focal abnormality. Adrenals/Urinary Tract: Normal adrenal glands. Dominant interpolar left renal collecting system calculus of 1.6 cm. No right renal calculi or hydronephrosis. No bladder calculi. Stomach/Bowel: Normal stomach, without wall thickening. Normal colon and terminal ileum. Normal small bowel. Vascular/Lymphatic: Aortic atherosclerosis. Ectasia of the left greater than right common iliac arteries a 1.8 cm. No abdominopelvic adenopathy. Reproductive: Normal uterus and adnexa. Other: No free intraperitoneal air. No significant free fluid. Increased anasarca. Musculoskeletal: Moderate osteoarthritis of both hips. IMPRESSION: 1. Mild to moderately degraded evaluation secondary to lack of IV contrast and patient arm position, not raised above the head. 2. The right lower lobe lung mass is felt to be slightly smaller, but poorly evaluated. 3. No thoracic adenopathy. 4. No evidence of metastatic disease in the abdomen or pelvis. 5. Right upper lobe mixed attenuation lesion versus area of scarring, similar. 6. Similar small right pleural effusion with loculation anteriorly. 7. Similar right  lower lobe endobronchial obstruction or compression with resultant volume loss. 8. Ascending aortic aneurysm at 4.3 cm. Recommend annual imaging followup by CTA or MRA. This recommendation follows 2010 ACCF/AHA/AATS/ACR/ASA/SCA/SCAI/SIR/STS/SVM Guidelines for the Diagnosis and Management of Patients with Thoracic Aortic Disease. Circulation. 2010; 121: Z733-z630. Aortic aneurysm NOS (ICD10-I71.9) 9. Incidental findings, including: Coronary artery atherosclerosis. Aortic Atherosclerosis (ICD10-I70.0). Pulmonary artery enlargement suggests pulmonary arterial hypertension. Left nephrolithiasis. Electronically Signed   By: Rockey Kilts M.D.   On: 05/01/2024 16:36     ASSESSMENT AND PLAN: This is a very pleasant 78 years old African-American female with  stage IV (T4, N2, M1 B) non-small cell lung cancer, adenocarcinoma presented with large right infrahilar mass in addition to right hilar and mediastinal lymphadenopathy as well as left adrenal gland metastasis diagnosed in December 2024.  She had molecular studies by foundation 1 that showed no actionable mutations and PD-L1 TPS of 90%. She is currently undergoing treatment with chemoimmunotherapy with carboplatin  for AUC of 5, Alimta  500 Mg/M2 and Keytruda  200 Mg IV every 3 weeks for 4 cycles followed by maintenance treatment with Alimta  and Keytruda .  Status post 7 cycle.  Starting from cycle #3 her dose of carboplatin  was reduced to AUC of 4 and Alimta  400 Mg/M2 secondary to pancytopenia.  Starting cycle #6 she will be on single agent  Keytruda  every 3 weeks.  Alimta  was discontinued secondary to intolerance and persistent pancytopenia. The patient has been tolerating this treatment fairly well with no concerning adverse effects. Assessment and Plan Assessment & Plan Adenocarcinoma Undergoing treatment for adenocarcinoma. Completed seven cycles of treatment, initially with Alimta  and Keytruda . Alimta  was discontinued after cycle six due to intolerance.  Currently on single-agent Keytruda . Reports feeling better on this regimen compared to chemotherapy. Denies nausea, vomiting, diarrhea, or headaches. Reports constipation, managed with dietary adjustments. Breathing is good. Blood counts are adequate for continuing treatment. - Proceed with cycle eight of Keytruda  treatment as planned. The patient was advised to call immediately if she has any concerning symptoms in the interval. The patient voices understanding of current disease status and treatment options and is in agreement with the current care plan.  All questions were answered. The patient knows to call the clinic with any problems, questions or concerns. We can certainly see the patient much sooner if necessary.  The total time spent in the appointment was 20 minutes.  Disclaimer: This note was dictated with voice recognition software. Similar sounding words can inadvertently be transcribed and may not be corrected upon review.

## 2024-05-24 NOTE — Progress Notes (Signed)
 Nutrition follow-up completed with patient during infusion for stage IV non-small cell lung cancer.  Patient currently receiving Keytruda  and is followed by Dr. Sherrod.  Weight:  151 pounds July 17 148 pounds 144 pounds 4.8 ounces March 12  Labs include BUN 26 and creatinine 2.1.  Patient denies nausea, vomiting, and diarrhea.  She reports constipation but thinks she had a small bowel movement yesterday.  She was trying to drink more juice.  Admits she does not drink much water.  States she takes a probiotic but has not taken a stool softener or laxative.  Patient denies any issues currently with taste alterations.  She has definite likes and dislikes which she discusses with her family when they are providing food.  Nutrition diagnosis: Unintentional weight loss improved.  Intervention: Education provided on strategies for improving to constipation. Education given on role of a probiotic and how they differ from stool softeners and laxatives. Encouraged increasing fluids to 80 ounces daily, focusing on water intake. Reviewed importance of bowel regimen. Provided with nutrition fact sheet.  Monitoring, evaluation, goals: Patient will tolerate adequate calories and protein to minimize weight loss throughout treatment.  Next visit: To be scheduled as needed.  **Disclaimer: This note was dictated with voice recognition software. Similar sounding words can inadvertently be transcribed and this note may contain transcription errors which may not have been corrected upon publication of note.**

## 2024-05-24 NOTE — Patient Instructions (Signed)

## 2024-05-26 ENCOUNTER — Other Ambulatory Visit: Payer: Self-pay

## 2024-05-31 ENCOUNTER — Inpatient Hospital Stay

## 2024-05-31 ENCOUNTER — Ambulatory Visit

## 2024-05-31 ENCOUNTER — Other Ambulatory Visit

## 2024-05-31 ENCOUNTER — Ambulatory Visit: Admitting: Internal Medicine

## 2024-05-31 DIAGNOSIS — C7972 Secondary malignant neoplasm of left adrenal gland: Secondary | ICD-10-CM | POA: Diagnosis not present

## 2024-05-31 DIAGNOSIS — Z5112 Encounter for antineoplastic immunotherapy: Secondary | ICD-10-CM | POA: Diagnosis not present

## 2024-05-31 DIAGNOSIS — Z87891 Personal history of nicotine dependence: Secondary | ICD-10-CM | POA: Diagnosis not present

## 2024-05-31 DIAGNOSIS — R918 Other nonspecific abnormal finding of lung field: Secondary | ICD-10-CM

## 2024-05-31 DIAGNOSIS — Z79899 Other long term (current) drug therapy: Secondary | ICD-10-CM | POA: Diagnosis not present

## 2024-05-31 DIAGNOSIS — D61818 Other pancytopenia: Secondary | ICD-10-CM | POA: Diagnosis not present

## 2024-05-31 DIAGNOSIS — C3431 Malignant neoplasm of lower lobe, right bronchus or lung: Secondary | ICD-10-CM | POA: Diagnosis not present

## 2024-05-31 LAB — CMP (CANCER CENTER ONLY)
ALT: 8 U/L (ref 0–44)
AST: 24 U/L (ref 15–41)
Albumin: 3.3 g/dL — ABNORMAL LOW (ref 3.5–5.0)
Alkaline Phosphatase: 55 U/L (ref 38–126)
Anion gap: 7 (ref 5–15)
BUN: 37 mg/dL — ABNORMAL HIGH (ref 8–23)
CO2: 29 mmol/L (ref 22–32)
Calcium: 9.1 mg/dL (ref 8.9–10.3)
Chloride: 106 mmol/L (ref 98–111)
Creatinine: 2.15 mg/dL — ABNORMAL HIGH (ref 0.44–1.00)
GFR, Estimated: 23 mL/min — ABNORMAL LOW (ref >60)
Glucose, Bld: 104 mg/dL — ABNORMAL HIGH (ref 70–99)
Potassium: 3.9 mmol/L (ref 3.5–5.1)
Sodium: 142 mmol/L (ref 135–145)
Total Bilirubin: 0.4 mg/dL (ref 0.0–1.2)
Total Protein: 8.1 g/dL (ref 6.5–8.1)

## 2024-05-31 LAB — CBC WITH DIFFERENTIAL (CANCER CENTER ONLY)
Abs Immature Granulocytes: 0.01 K/uL (ref 0.00–0.07)
Basophils Absolute: 0 K/uL (ref 0.0–0.1)
Basophils Relative: 0 %
Eosinophils Absolute: 0 K/uL (ref 0.0–0.5)
Eosinophils Relative: 1 %
HCT: 28.8 % — ABNORMAL LOW (ref 36.0–46.0)
Hemoglobin: 9.1 g/dL — ABNORMAL LOW (ref 12.0–15.0)
Immature Granulocytes: 0 %
Lymphocytes Relative: 38 %
Lymphs Abs: 2.3 K/uL (ref 0.7–4.0)
MCH: 29.6 pg (ref 26.0–34.0)
MCHC: 31.6 g/dL (ref 30.0–36.0)
MCV: 93.8 fL (ref 80.0–100.0)
Monocytes Absolute: 0.6 K/uL (ref 0.1–1.0)
Monocytes Relative: 10 %
Neutro Abs: 3 K/uL (ref 1.7–7.7)
Neutrophils Relative %: 51 %
Platelet Count: 178 K/uL (ref 150–400)
RBC: 3.07 MIL/uL — ABNORMAL LOW (ref 3.87–5.11)
RDW: 14.9 % (ref 11.5–15.5)
WBC Count: 6 K/uL (ref 4.0–10.5)
nRBC: 0 % (ref 0.0–0.2)

## 2024-05-31 MED ORDER — SODIUM CHLORIDE 0.9% FLUSH
10.0000 mL | Freq: Once | INTRAVENOUS | Status: AC
Start: 2024-05-31 — End: 2024-05-31
  Administered 2024-05-31: 10 mL

## 2024-05-31 MED ORDER — HEPARIN SOD (PORK) LOCK FLUSH 100 UNIT/ML IV SOLN
500.0000 [IU] | Freq: Once | INTRAVENOUS | Status: AC
  Administered 2024-05-31: 500 [IU]

## 2024-06-01 ENCOUNTER — Telehealth: Payer: Self-pay | Admitting: Physician Assistant

## 2024-06-01 NOTE — Telephone Encounter (Signed)
 I called the patient and left voicemail asking she increase her hydration with her elevated creatinine the last few lab visits.

## 2024-06-02 ENCOUNTER — Other Ambulatory Visit: Payer: Self-pay

## 2024-06-06 DIAGNOSIS — H04123 Dry eye syndrome of bilateral lacrimal glands: Secondary | ICD-10-CM | POA: Diagnosis not present

## 2024-06-06 DIAGNOSIS — H2512 Age-related nuclear cataract, left eye: Secondary | ICD-10-CM | POA: Diagnosis not present

## 2024-06-06 DIAGNOSIS — H2589 Other age-related cataract: Secondary | ICD-10-CM | POA: Diagnosis not present

## 2024-06-06 DIAGNOSIS — H25042 Posterior subcapsular polar age-related cataract, left eye: Secondary | ICD-10-CM | POA: Diagnosis not present

## 2024-06-07 ENCOUNTER — Inpatient Hospital Stay

## 2024-06-07 VITALS — BP 148/87 | HR 66 | Temp 97.9°F | Resp 17

## 2024-06-07 DIAGNOSIS — Z79899 Other long term (current) drug therapy: Secondary | ICD-10-CM | POA: Diagnosis not present

## 2024-06-07 DIAGNOSIS — R918 Other nonspecific abnormal finding of lung field: Secondary | ICD-10-CM

## 2024-06-07 DIAGNOSIS — Z5112 Encounter for antineoplastic immunotherapy: Secondary | ICD-10-CM | POA: Diagnosis not present

## 2024-06-07 DIAGNOSIS — Z87891 Personal history of nicotine dependence: Secondary | ICD-10-CM | POA: Diagnosis not present

## 2024-06-07 DIAGNOSIS — D649 Anemia, unspecified: Secondary | ICD-10-CM

## 2024-06-07 DIAGNOSIS — C7972 Secondary malignant neoplasm of left adrenal gland: Secondary | ICD-10-CM | POA: Diagnosis not present

## 2024-06-07 DIAGNOSIS — C3431 Malignant neoplasm of lower lobe, right bronchus or lung: Secondary | ICD-10-CM

## 2024-06-07 DIAGNOSIS — D61818 Other pancytopenia: Secondary | ICD-10-CM | POA: Diagnosis not present

## 2024-06-07 LAB — CBC WITH DIFFERENTIAL (CANCER CENTER ONLY)
Abs Immature Granulocytes: 0.01 K/uL (ref 0.00–0.07)
Basophils Absolute: 0 K/uL (ref 0.0–0.1)
Basophils Relative: 0 %
Eosinophils Absolute: 0 K/uL (ref 0.0–0.5)
Eosinophils Relative: 0 %
HCT: 30.3 % — ABNORMAL LOW (ref 36.0–46.0)
Hemoglobin: 9.6 g/dL — ABNORMAL LOW (ref 12.0–15.0)
Immature Granulocytes: 0 %
Lymphocytes Relative: 34 %
Lymphs Abs: 1.9 K/uL (ref 0.7–4.0)
MCH: 29.4 pg (ref 26.0–34.0)
MCHC: 31.7 g/dL (ref 30.0–36.0)
MCV: 92.7 fL (ref 80.0–100.0)
Monocytes Absolute: 0.6 K/uL (ref 0.1–1.0)
Monocytes Relative: 10 %
Neutro Abs: 3 K/uL (ref 1.7–7.7)
Neutrophils Relative %: 56 %
Platelet Count: 176 K/uL (ref 150–400)
RBC: 3.27 MIL/uL — ABNORMAL LOW (ref 3.87–5.11)
RDW: 14.9 % (ref 11.5–15.5)
WBC Count: 5.5 K/uL (ref 4.0–10.5)
nRBC: 0 % (ref 0.0–0.2)

## 2024-06-07 LAB — CMP (CANCER CENTER ONLY)
ALT: 9 U/L (ref 0–44)
AST: 24 U/L (ref 15–41)
Albumin: 3.4 g/dL — ABNORMAL LOW (ref 3.5–5.0)
Alkaline Phosphatase: 56 U/L (ref 38–126)
Anion gap: 7 (ref 5–15)
BUN: 33 mg/dL — ABNORMAL HIGH (ref 8–23)
CO2: 27 mmol/L (ref 22–32)
Calcium: 9 mg/dL (ref 8.9–10.3)
Chloride: 106 mmol/L (ref 98–111)
Creatinine: 2.09 mg/dL — ABNORMAL HIGH (ref 0.44–1.00)
GFR, Estimated: 24 mL/min — ABNORMAL LOW (ref >60)
Glucose, Bld: 87 mg/dL (ref 70–99)
Potassium: 4.1 mmol/L (ref 3.5–5.1)
Sodium: 140 mmol/L (ref 135–145)
Total Bilirubin: 0.5 mg/dL (ref 0.0–1.2)
Total Protein: 8.3 g/dL — ABNORMAL HIGH (ref 6.5–8.1)

## 2024-06-07 MED ORDER — HEPARIN SOD (PORK) LOCK FLUSH 100 UNIT/ML IV SOLN
500.0000 [IU] | Freq: Once | INTRAVENOUS | Status: AC
  Administered 2024-06-07: 500 [IU]

## 2024-06-07 MED ORDER — SODIUM CHLORIDE 0.9% FLUSH
10.0000 mL | Freq: Once | INTRAVENOUS | Status: AC
  Administered 2024-06-07: 10 mL

## 2024-06-14 ENCOUNTER — Inpatient Hospital Stay

## 2024-06-14 ENCOUNTER — Inpatient Hospital Stay: Admitting: Internal Medicine

## 2024-06-14 ENCOUNTER — Telehealth: Payer: Self-pay

## 2024-06-14 ENCOUNTER — Inpatient Hospital Stay: Attending: Internal Medicine

## 2024-06-14 VITALS — BP 124/80 | HR 64 | Temp 97.8°F | Resp 16 | Ht 64.0 in | Wt 145.0 lb

## 2024-06-14 DIAGNOSIS — C3490 Malignant neoplasm of unspecified part of unspecified bronchus or lung: Secondary | ICD-10-CM | POA: Diagnosis not present

## 2024-06-14 DIAGNOSIS — C3431 Malignant neoplasm of lower lobe, right bronchus or lung: Secondary | ICD-10-CM

## 2024-06-14 DIAGNOSIS — Z79899 Other long term (current) drug therapy: Secondary | ICD-10-CM | POA: Diagnosis not present

## 2024-06-14 DIAGNOSIS — Z87891 Personal history of nicotine dependence: Secondary | ICD-10-CM | POA: Diagnosis not present

## 2024-06-14 DIAGNOSIS — Z5112 Encounter for antineoplastic immunotherapy: Secondary | ICD-10-CM | POA: Insufficient documentation

## 2024-06-14 DIAGNOSIS — D61818 Other pancytopenia: Secondary | ICD-10-CM | POA: Diagnosis not present

## 2024-06-14 DIAGNOSIS — R918 Other nonspecific abnormal finding of lung field: Secondary | ICD-10-CM

## 2024-06-14 LAB — CBC WITH DIFFERENTIAL (CANCER CENTER ONLY)
Abs Immature Granulocytes: 0.01 K/uL (ref 0.00–0.07)
Basophils Absolute: 0 K/uL (ref 0.0–0.1)
Basophils Relative: 0 %
Eosinophils Absolute: 0 K/uL (ref 0.0–0.5)
Eosinophils Relative: 1 %
HCT: 28.7 % — ABNORMAL LOW (ref 36.0–46.0)
Hemoglobin: 9.3 g/dL — ABNORMAL LOW (ref 12.0–15.0)
Immature Granulocytes: 0 %
Lymphocytes Relative: 33 %
Lymphs Abs: 1.6 K/uL (ref 0.7–4.0)
MCH: 29.3 pg (ref 26.0–34.0)
MCHC: 32.4 g/dL (ref 30.0–36.0)
MCV: 90.5 fL (ref 80.0–100.0)
Monocytes Absolute: 0.5 K/uL (ref 0.1–1.0)
Monocytes Relative: 10 %
Neutro Abs: 2.8 K/uL (ref 1.7–7.7)
Neutrophils Relative %: 56 %
Platelet Count: 169 K/uL (ref 150–400)
RBC: 3.17 MIL/uL — ABNORMAL LOW (ref 3.87–5.11)
RDW: 14.3 % (ref 11.5–15.5)
WBC Count: 5 K/uL (ref 4.0–10.5)
nRBC: 0 % (ref 0.0–0.2)

## 2024-06-14 LAB — CMP (CANCER CENTER ONLY)
ALT: 8 U/L (ref 0–44)
AST: 22 U/L (ref 15–41)
Albumin: 3.6 g/dL (ref 3.5–5.0)
Alkaline Phosphatase: 56 U/L (ref 38–126)
Anion gap: 6 (ref 5–15)
BUN: 34 mg/dL — ABNORMAL HIGH (ref 8–23)
CO2: 27 mmol/L (ref 22–32)
Calcium: 9.2 mg/dL (ref 8.9–10.3)
Chloride: 106 mmol/L (ref 98–111)
Creatinine: 2.18 mg/dL — ABNORMAL HIGH (ref 0.44–1.00)
GFR, Estimated: 23 mL/min — ABNORMAL LOW (ref >60)
Glucose, Bld: 92 mg/dL (ref 70–99)
Potassium: 3.9 mmol/L (ref 3.5–5.1)
Sodium: 139 mmol/L (ref 135–145)
Total Bilirubin: 0.5 mg/dL (ref 0.0–1.2)
Total Protein: 8.3 g/dL — ABNORMAL HIGH (ref 6.5–8.1)

## 2024-06-14 LAB — TSH: TSH: 0.849 u[IU]/mL (ref 0.350–4.500)

## 2024-06-14 MED ORDER — SODIUM CHLORIDE 0.9% FLUSH
10.0000 mL | Freq: Once | INTRAVENOUS | Status: AC
Start: 2024-06-14 — End: 2024-06-14
  Administered 2024-06-14: 10 mL

## 2024-06-14 MED ORDER — SODIUM CHLORIDE 0.9 % IV SOLN
200.0000 mg | Freq: Once | INTRAVENOUS | Status: AC
  Administered 2024-06-14: 200 mg via INTRAVENOUS
  Filled 2024-06-14: qty 8

## 2024-06-14 MED ORDER — SODIUM CHLORIDE 0.9 % IV SOLN: INTRAVENOUS | Status: DC

## 2024-06-14 NOTE — Patient Instructions (Signed)

## 2024-06-14 NOTE — Progress Notes (Signed)
 Ok to d/c b12 and compazine  from tx plan since pt no longer receiving pemetrexed  per Dr Sherrod

## 2024-06-14 NOTE — Telephone Encounter (Signed)
 I called  pt and left voicemail on home phone letting her know that she was 30 mins late for her port flush appointment. Asked  her to call back at [phone]

## 2024-06-14 NOTE — Progress Notes (Signed)
 [facility] Telephone:([phone]) [phone]   Fax:(336) [phone]  OFFICE PROGRESS NOTE  [name], MD [address] [place] [address]  DIAGNOSIS: stage IV (T4, N2, M1 B) non-small cell lung cancer, adenocarcinoma presented with large right infrahilar mass in addition to right hilar and mediastinal lymphadenopathy as well as left adrenal gland metastasis diagnosed in December 2024.   Biomarker Findings Tumor Mutational Burden - 24 Muts/Mb HRD signature - HRDsig Negative Microsatellite status - MS-Stable Genomic Findings For a complete list of the genes assayed, please refer to the Appendix. BRAF G464V KRAS G13C ASXL1 deletion exon 12 TP53 splice site 920-2A>T 6 Disease relevant genes with no reportable alterations: ALK, EGFR, ERBB2, MET, RET, ROS1  PDL1 Expression: 90%  PRIOR THERAPY: None  CURRENT THERAPY: Systemic treatment with combination of chemoimmunotherapy with carboplatin  for AUC of 5, Alimta  500 Mg/M2 and Keytruda  200 Mg IV every 3 weeks.  First dose December 08, 2023.  Status post 8 cycles.  Starting from cycle #3 her dose of carboplatin  will be for AUC of 4 and Alimta  400 Mg/M2 secondary to pancytopenia.  Starting cycle #5 she was on maintenance treatment with Alimta  and Keytruda  every 3 weeks.  Starting from cycle #6 she is on treatment with single agent Keytruda  every 3 weeks.  Alimta  discontinued secondary to toxicity.  INTERVAL HISTORY: Tami Shea 78 y.o. female returns to the clinic today for follow-up visit accompanied by her son. Discussed the use of AI scribe software for clinical note transcription with the patient, who gave verbal consent to proceed.  History of Present Illness Tami Shea is a 78 year old female with stage four non-small cell lung cancer who presents for evaluation before starting cycle number nine of chemotherapy. She is accompanied by her oldest son.  She was diagnosed with stage four non-small cell lung cancer,  adenocarcinoma, in December 2022. The cancer has no actionable mutations and a PD-L1 expression of 90%. Initially, she started palliative chemo-immunotherapy with carboplatin , Alimta , and Keytruda  every three weeks for four cycles. From cycle six, she continued with single-agent Keytruda  as Alimta  was discontinued due to intolerance. She has completed eight cycles and is here for evaluation before starting cycle nine.  She reports no new symptoms since the last visit. She has been experiencing improvement in constipation. She denies taking any new medications that could affect her kidneys and mentions drinking two non-alcoholic ginger beers. No nausea, vomiting, or diarrhea. She feels better overall.   MEDICAL HISTORY: Past Medical History:  Diagnosis Date   Dyspnea    History of kidney stones    HTN (hypertension)    Smoker     ALLERGIES:  has no known allergies.  MEDICATIONS:  Current Outpatient Medications  Medication Sig Dispense Refill   amLODipine (NORVASC) 10 MG tablet 1 tablet     cephALEXin  (KEFLEX ) 250 MG capsule Take 1 capsule (250 mg total) by mouth 3 (three) times daily. 15 capsule 0   Cholecalciferol (VITAMIN D3) 1.25 MG (50000 UT) CAPS Take 1 capsule by mouth once a week.     folic acid  (FOLVITE ) 1 MG tablet Take 1 tablet (1 mg total) by mouth daily. Start 7 days before pemetrexed  chemotherapy. Continue until 21 days after pemetrexed  completed. 100 tablet 3   lidocaine -prilocaine  (EMLA ) cream Apply to affected area once 30 g 3   LORazepam  (ATIVAN ) 0.5 MG tablet Take 1 tablet by mouth 30-60 minutes before your scan 2 tablet 0  ondansetron  (ZOFRAN ) 8 MG tablet Take 1 tablet (8 mg total) by mouth every 8 (eight) hours as needed for nausea or vomiting. Start on the third [place] after carboplatin . (Patient not taking: Reported on 04/11/2024) 30 tablet 1   prochlorperazine  (COMPAZINE ) 10 MG tablet Take 1 tablet (10 mg total) by mouth every 6 (six) hours as needed for nausea or  vomiting. 30 tablet 1   RESTASIS 0.05 % ophthalmic emulsion Place 1 drop into both eyes 2 (two) times daily.     No current facility-administered medications for this visit.    SURGICAL HISTORY:  Past Surgical History:  Procedure Laterality Date   BRONCHIAL NEEDLE ASPIRATION BIOPSY  10/24/2023   Procedure: BRONCHIAL NEEDLE ASPIRATION BIOPSIES;  Surgeon: Brenna Adine CROME, DO;  Location: [facility];  Service: Pulmonary;;   COLONOSCOPY     IR IMAGING GUIDED PORT INSERTION  12/09/2023   PILONIDAL CYST EXCISION     tonisllectomy     VIDEO BRONCHOSCOPY WITH ENDOBRONCHIAL ULTRASOUND Right 10/24/2023   Procedure: VIDEO BRONCHOSCOPY WITH ENDOBRONCHIAL ULTRASOUND;  Surgeon: Brenna Adine CROME, DO;  Location: [facility];  Service: Pulmonary;  Laterality: Right;    REVIEW OF SYSTEMS:  A comprehensive review of systems was negative except for: Constitutional: positive for fatigue Gastrointestinal: positive for constipation   PHYSICAL EXAMINATION: General appearance: alert, cooperative, fatigued, and no distress Head: Normocephalic, without obvious abnormality, atraumatic Neck: no adenopathy, no JVD, supple, symmetrical, trachea midline, and thyroid  not enlarged, symmetric, no tenderness/mass/nodules Lymph nodes: Cervical, supraclavicular, and axillary nodes normal. Resp: clear to auscultation bilaterally Back: symmetric, no curvature. ROM normal. No CVA tenderness. Cardio: regular rate and rhythm, S1, S2 normal, no murmur, click, rub or gallop GI: soft, non-tender; bowel sounds normal; no masses,  no organomegaly Extremities: extremities normal, atraumatic, no cyanosis or edema  ECOG PERFORMANCE STATUS: 1 - Symptomatic but completely ambulatory  Blood pressure 124/80, pulse 64, temperature 97.8 F (36.6 C), temperature source Temporal, resp. rate 16, height 5' 4 (1.626 m), weight 145 lb (65.8 kg), SpO2 100%.  LABORATORY DATA: Lab Results  Component Value Date   WBC 5.5 06/07/2024   HGB  9.6 (L) 06/07/2024   HCT 30.3 (L) 06/07/2024   MCV 92.7 06/07/2024   PLT 176 06/07/2024      Chemistry      Component Value Date/Time   NA 140 06/07/2024 1007   NA 142 10/12/2021 1043   K 4.1 06/07/2024 1007   CL 106 06/07/2024 1007   CO2 27 06/07/2024 1007   BUN 33 (H) 06/07/2024 1007   BUN 17 10/12/2021 1043   CREATININE 2.09 (H) 06/07/2024 1007      Component Value Date/Time   CALCIUM 9.0 06/07/2024 1007   ALKPHOS 56 06/07/2024 1007   AST 24 06/07/2024 1007   ALT 9 06/07/2024 1007   BILITOT 0.5 06/07/2024 1007       RADIOGRAPHIC STUDIES: No results found.    ASSESSMENT AND PLAN: This is a very pleasant 78 years old African-American female with  stage IV (T4, N2, M1 B) non-small cell lung cancer, adenocarcinoma presented with large right infrahilar mass in addition to right hilar and mediastinal lymphadenopathy as well as left adrenal gland metastasis diagnosed in December 2024.  She had molecular studies by [facility] 1 that showed no actionable mutations and PD-L1 TPS of 90%. She is currently undergoing treatment with chemoimmunotherapy with carboplatin  for AUC of 5, Alimta  500 Mg/M2 and Keytruda  200 Mg IV every 3 weeks for 4 cycles followed by maintenance  treatment with Alimta  and Keytruda .  Status post 7 cycle.  Starting from cycle #3 her dose of carboplatin  was reduced to AUC of 4 and Alimta  400 Mg/M2 secondary to pancytopenia.  Starting cycle #6 she will be on single agent Keytruda  every 3 weeks.  Alimta  was discontinued secondary to intolerance and persistent pancytopenia. The patient has been tolerating this treatment fairly well with no concerning adverse effects except for the fatigue and constipation. Assessment and Plan Assessment & Plan Stage IV non-small cell lung cancer, adenocarcinoma Stage IV non-small cell lung cancer, adenocarcinoma with no actionable mutations and PD-L1 expression of 90%. Currently on palliative chemoimmunotherapy. Completed eight cycles  of treatment with carboplatin , Alimta , and Keytruda . Alimta  was discontinued due to intolerance. Currently on single-agent Keytruda . No new symptoms reported. - Proceed with cycle number nine of Keytruda  - Order scan in two weeks to assess treatment efficacy  Chronic kidney disease Chronic kidney disease with recent increase in kidney function markers. - Advise increased hydration to manage kidney function  Anemia Mild anemia noted.  Constipation Constipation improved, likely related to dehydration. - Advise increased hydration to aid bowel movements The patient was advised to call immediately if she has any other concerning symptoms in the interval.  The patient voices understanding of current disease status and treatment options and is in agreement with the current care plan.  All questions were answered. The patient knows to call the clinic with any problems, questions or concerns. We can certainly see the patient much sooner if necessary.  The total time spent in the appointment was 20 minutes.  Disclaimer: This note was dictated with voice recognition software. Similar sounding words can inadvertently be transcribed and may not be corrected upon review.

## 2024-06-14 NOTE — Progress Notes (Signed)
 OK to treat with creatinine 2.18 per Dr Sherrod

## 2024-06-15 ENCOUNTER — Other Ambulatory Visit: Payer: Self-pay

## 2024-06-15 LAB — T4: T4, Total: 8.3 ug/dL (ref 4.5–12.0)

## 2024-06-21 ENCOUNTER — Inpatient Hospital Stay

## 2024-06-21 DIAGNOSIS — Z79899 Other long term (current) drug therapy: Secondary | ICD-10-CM | POA: Diagnosis not present

## 2024-06-21 DIAGNOSIS — D61818 Other pancytopenia: Secondary | ICD-10-CM | POA: Diagnosis not present

## 2024-06-21 DIAGNOSIS — C3431 Malignant neoplasm of lower lobe, right bronchus or lung: Secondary | ICD-10-CM | POA: Diagnosis not present

## 2024-06-21 DIAGNOSIS — D649 Anemia, unspecified: Secondary | ICD-10-CM

## 2024-06-21 DIAGNOSIS — R918 Other nonspecific abnormal finding of lung field: Secondary | ICD-10-CM

## 2024-06-21 DIAGNOSIS — Z87891 Personal history of nicotine dependence: Secondary | ICD-10-CM | POA: Diagnosis not present

## 2024-06-21 DIAGNOSIS — Z5112 Encounter for antineoplastic immunotherapy: Secondary | ICD-10-CM | POA: Diagnosis not present

## 2024-06-21 LAB — CBC WITH DIFFERENTIAL (CANCER CENTER ONLY)
Abs Immature Granulocytes: 0.01 K/uL (ref 0.00–0.07)
Basophils Absolute: 0 K/uL (ref 0.0–0.1)
Basophils Relative: 0 %
Eosinophils Absolute: 0 K/uL (ref 0.0–0.5)
Eosinophils Relative: 1 %
HCT: 28.8 % — ABNORMAL LOW (ref 36.0–46.0)
Hemoglobin: 9.4 g/dL — ABNORMAL LOW (ref 12.0–15.0)
Immature Granulocytes: 0 %
Lymphocytes Relative: 34 %
Lymphs Abs: 1.7 K/uL (ref 0.7–4.0)
MCH: 29.6 pg (ref 26.0–34.0)
MCHC: 32.6 g/dL (ref 30.0–36.0)
MCV: 90.6 fL (ref 80.0–100.0)
Monocytes Absolute: 0.6 K/uL (ref 0.1–1.0)
Monocytes Relative: 12 %
Neutro Abs: 2.7 K/uL (ref 1.7–7.7)
Neutrophils Relative %: 53 %
Platelet Count: 166 K/uL (ref 150–400)
RBC: 3.18 MIL/uL — ABNORMAL LOW (ref 3.87–5.11)
RDW: 14.2 % (ref 11.5–15.5)
WBC Count: 5.1 K/uL (ref 4.0–10.5)
nRBC: 0 % (ref 0.0–0.2)

## 2024-06-21 LAB — CMP (CANCER CENTER ONLY)
ALT: 9 U/L (ref 0–44)
AST: 23 U/L (ref 15–41)
Albumin: 3.5 g/dL (ref 3.5–5.0)
Alkaline Phosphatase: 54 U/L (ref 38–126)
Anion gap: 6 (ref 5–15)
BUN: 40 mg/dL — ABNORMAL HIGH (ref 8–23)
CO2: 27 mmol/L (ref 22–32)
Calcium: 9.1 mg/dL (ref 8.9–10.3)
Chloride: 108 mmol/L (ref 98–111)
Creatinine: 2.1 mg/dL — ABNORMAL HIGH (ref 0.44–1.00)
GFR, Estimated: 24 mL/min — ABNORMAL LOW (ref >60)
Glucose, Bld: 88 mg/dL (ref 70–99)
Potassium: 3.6 mmol/L (ref 3.5–5.1)
Sodium: 141 mmol/L (ref 135–145)
Total Bilirubin: 0.5 mg/dL (ref 0.0–1.2)
Total Protein: 8 g/dL (ref 6.5–8.1)

## 2024-06-21 MED ORDER — SODIUM CHLORIDE 0.9% FLUSH
10.0000 mL | Freq: Once | INTRAVENOUS | Status: AC
  Administered 2024-06-21: 10 mL

## 2024-06-21 NOTE — Progress Notes (Signed)
 Patient here for port flush/labs.  Son states that his mother received a call stating she would have extra labs done today.  Secure chatted and spoke with Diane/RN regarding the above.  States she's not aware of any extra labs or phone call.  Patient and son made aware.

## 2024-06-28 ENCOUNTER — Inpatient Hospital Stay (HOSPITAL_BASED_OUTPATIENT_CLINIC_OR_DEPARTMENT_OTHER)

## 2024-06-28 ENCOUNTER — Ambulatory Visit (HOSPITAL_COMMUNITY)
Admission: RE | Admit: 2024-06-28 | Discharge: 2024-06-28 | Disposition: A | Discharge status: Home / Self Care | Source: Ambulatory Visit | Attending: Internal Medicine | Admitting: Internal Medicine

## 2024-06-28 DIAGNOSIS — D61818 Other pancytopenia: Secondary | ICD-10-CM | POA: Diagnosis not present

## 2024-06-28 DIAGNOSIS — R918 Other nonspecific abnormal finding of lung field: Secondary | ICD-10-CM

## 2024-06-28 DIAGNOSIS — Z5112 Encounter for antineoplastic immunotherapy: Secondary | ICD-10-CM | POA: Diagnosis not present

## 2024-06-28 DIAGNOSIS — C3490 Malignant neoplasm of unspecified part of unspecified bronchus or lung: Secondary | ICD-10-CM | POA: Diagnosis not present

## 2024-06-28 DIAGNOSIS — C3431 Malignant neoplasm of lower lobe, right bronchus or lung: Secondary | ICD-10-CM

## 2024-06-28 DIAGNOSIS — I700 Atherosclerosis of aorta: Secondary | ICD-10-CM | POA: Diagnosis not present

## 2024-06-28 DIAGNOSIS — Z79899 Other long term (current) drug therapy: Secondary | ICD-10-CM | POA: Diagnosis not present

## 2024-06-28 DIAGNOSIS — Z87891 Personal history of nicotine dependence: Secondary | ICD-10-CM | POA: Diagnosis not present

## 2024-06-28 DIAGNOSIS — D649 Anemia, unspecified: Secondary | ICD-10-CM

## 2024-06-28 LAB — CBC WITH DIFFERENTIAL (CANCER CENTER ONLY)
Abs Immature Granulocytes: 0.01 K/uL (ref 0.00–0.07)
Basophils Absolute: 0 K/uL (ref 0.0–0.1)
Basophils Relative: 0 %
Eosinophils Absolute: 0 K/uL (ref 0.0–0.5)
Eosinophils Relative: 1 %
HCT: 30.1 % — ABNORMAL LOW (ref 36.0–46.0)
Hemoglobin: 9.7 g/dL — ABNORMAL LOW (ref 12.0–15.0)
Immature Granulocytes: 0 %
Lymphocytes Relative: 30 %
Lymphs Abs: 1.9 K/uL (ref 0.7–4.0)
MCH: 29 pg (ref 26.0–34.0)
MCHC: 32.2 g/dL (ref 30.0–36.0)
MCV: 90.1 fL (ref 80.0–100.0)
Monocytes Absolute: 0.7 K/uL (ref 0.1–1.0)
Monocytes Relative: 12 %
Neutro Abs: 3.6 K/uL (ref 1.7–7.7)
Neutrophils Relative %: 57 %
Platelet Count: 175 K/uL (ref 150–400)
RBC: 3.34 MIL/uL — ABNORMAL LOW (ref 3.87–5.11)
RDW: 14.1 % (ref 11.5–15.5)
WBC Count: 6.3 K/uL (ref 4.0–10.5)
nRBC: 0 % (ref 0.0–0.2)

## 2024-06-28 MED ORDER — SODIUM CHLORIDE 0.9% FLUSH
10.0000 mL | Freq: Once | INTRAVENOUS | Status: AC
Start: 2024-06-28 — End: 2024-06-28
  Administered 2024-06-28: 10 mL

## 2024-06-28 MED ORDER — HEPARIN SOD (PORK) LOCK FLUSH 100 UNIT/ML IV SOLN
500.0000 [IU] | Freq: Once | INTRAVENOUS | Status: AC
  Administered 2024-06-28: 500 [IU] via INTRAVENOUS

## 2024-06-28 NOTE — Progress Notes (Signed)
 [facility] OFFICE PROGRESS NOTE  Tami Aland, MD [address], [address] [place] [address]  DIAGNOSIS: Stage IV (T4, N2, M1 B) non-small cell lung cancer, adenocarcinoma presented with large right infrahilar mass in addition to right hilar and mediastinal lymphadenopathy as well as left adrenal gland metastasis diagnosed in December 2024.    Biomarker Findings Tumor Mutational Burden - 24 Muts/Mb HRD signature - HRDsig Negative Microsatellite status - MS-Stable Genomic Findings For a complete list of the genes assayed, please refer to the Appendix. BRAF G464V KRAS G13C ASXL1 deletion exon 12 TP53 splice site 920-2A>T 6 Disease relevant genes with no reportable alterations: ALK, EGFR, ERBB2, MET, RET, ROS1   PDL1 Expression: 90%  PRIOR THERAPY: None  CURRENT THERAPY:  Systemic treatment with combination of chemoimmunotherapy with carboplatin  for AUC of 5, Alimta  500 Mg/M2 and Keytruda  200 Mg IV every 3 weeks.  First dose December 08, 2023.  Status post 9 cycles.  Starting from cycle #3 her dose of carboplatin  will be for AUC of 4 and Alimta  400 Mg/M2 secondary to pancytopenia.  Starting cycle #5 she was on maintenance treatment with Alimta  and Keytruda  every 3 weeks.  Starting from cycle #6 she will be on single agent Keytruda  every 3 weeks.  Alimta  discontinued secondary to toxicity.   INTERVAL HISTORY: Tami Shea 78 y.o. female returns to the clinic today for a follow-up visit. Her daughter is available by phone.  She is currently on single agent monotherapy with Keytruda .  She tolerates this fairly well.  Her Alimta  was discontinued due to worsening CKD and significant anemia requiring blood transfusions.  She has been tolerating single agent immunotherapy better.  The patient does not have a nephrologist.  She also does not have a cardiologist as her scans have been showing stable pericardial effusion.  She does not have any significant lower extremity swelling.  She  has hypertension.  She forgot to take her antihypertensive today.  She denies any fever, chills, night sweats, or unexplained weight loss.  She denies any significant shortness of breath with exertion, cough, chest pain, or hemoptysis.  She denies any nausea, vomiting, diarrhea, or constipation.  Denies any rashes or skin changes except she continues to have the area of hyperpigmentation on her left lower extremity which may be from her prior treatment with Alimta .  She recently had a restaging CT scan.  She is here today for evaluation and to review her scan results before undergoing her next cycle of treatment.  She is here today for evaluation and to review her scan before undergoing cycle #10.   MEDICAL HISTORY: Past Medical History:  Diagnosis Date   Dyspnea    History of kidney stones    HTN (hypertension)    Smoker     ALLERGIES:  has no known allergies.  MEDICATIONS:  Current Outpatient Medications  Medication Sig Dispense Refill   amLODipine (NORVASC) 10 MG tablet 1 tablet     cephALEXin  (KEFLEX ) 250 MG capsule Take 1 capsule (250 mg total) by mouth 3 (three) times daily. 15 capsule 0   Cholecalciferol (VITAMIN D3) 1.25 MG (50000 UT) CAPS Take 1 capsule by mouth once a week.     folic acid  (FOLVITE ) 1 MG tablet Take 1 tablet (1 mg total) by mouth daily. Start 7 days before pemetrexed  chemotherapy. Continue until 21 days after pemetrexed  completed. 100 tablet 3   lidocaine -prilocaine  (EMLA ) cream Apply to affected area once 30 g 3   LORazepam  (ATIVAN )  0.5 MG tablet Take 1 tablet by mouth 30-60 minutes before your scan 2 tablet 0   ondansetron  (ZOFRAN ) 8 MG tablet Take 1 tablet (8 mg total) by mouth every 8 (eight) hours as needed for nausea or vomiting. Start on the third Zern after carboplatin . (Patient not taking: Reported on 04/11/2024) 30 tablet 1   prochlorperazine  (COMPAZINE ) 10 MG tablet Take 1 tablet (10 mg total) by mouth every 6 (six) hours as needed for nausea or vomiting.  30 tablet 1   RESTASIS 0.05 % ophthalmic emulsion Place 1 drop into both eyes 2 (two) times daily.     No current facility-administered medications for this visit.    SURGICAL HISTORY:  Past Surgical History:  Procedure Laterality Date   BRONCHIAL NEEDLE ASPIRATION BIOPSY  10/24/2023   Procedure: BRONCHIAL NEEDLE ASPIRATION BIOPSIES;  Surgeon: Brenna Adine CROME, DO;  Location: [facility];  Service: Pulmonary;;   COLONOSCOPY     IR IMAGING GUIDED PORT INSERTION  12/09/2023   PILONIDAL CYST EXCISION     tonisllectomy     VIDEO BRONCHOSCOPY WITH ENDOBRONCHIAL ULTRASOUND Right 10/24/2023   Procedure: VIDEO BRONCHOSCOPY WITH ENDOBRONCHIAL ULTRASOUND;  Surgeon: Brenna Adine CROME, DO;  Location: [facility];  Service: Pulmonary;  Laterality: Right;    REVIEW OF SYSTEMS:   Review of Systems  Constitutional: Stable fatigue. Negative for appetite change, chills, fever and unexpected weight change.  HENT: Negative for mouth sores, nosebleeds, sore throat and trouble swallowing.   Eyes: Negative for eye problems and icterus.  Respiratory: Negative for cough, hemoptysis, shortness of breath and wheezing.   Cardiovascular: Negative for chest pain and leg swelling.  Gastrointestinal: Negative for abdominal pain, constipation, diarrhea, nausea and vomiting.  Genitourinary: Negative for bladder incontinence, difficulty urinating, dysuria, frequency and hematuria.   Musculoskeletal: Negative for back pain, gait problem, neck pain and neck stiffness.  Skin: Negative for itching and rash. Stable hyperpigmentation of her posterior left ankle. Neurological: Negative for dizziness, extremity weakness, gait problem, headaches, light-headedness and seizures.  Hematological: Negative for adenopathy. Does not bruise/bleed easily.  Psychiatric/Behavioral: Negative for confusion, depression and sleep disturbance. The patient is not nervous/anxious.     PHYSICAL EXAMINATION:  There were no vitals taken for  this visit.  ECOG PERFORMANCE STATUS: 1  Physical Exam  Constitutional: Oriented to person, place, and time and well-developed, well-nourished, and in no distress.  HENT:  Head: Normocephalic and atraumatic.  Mouth/Throat: Oropharynx is clear and moist. No oropharyngeal exudate.  Eyes: Conjunctivae are normal. Right eye exhibits no discharge. Left eye exhibits no discharge. No scleral icterus.  Neck: Normal range of motion. Neck supple.  Cardiovascular: Normal rate, regular rhythm, normal heart sounds and intact distal pulses.   Pulmonary/Chest: Effort normal and breath sounds normal. No respiratory distress. No wheezes. No rales.  Abdominal: Soft. Bowel sounds are normal. Exhibits no distension and no mass. There is no tenderness.  Musculoskeletal: Normal range of motion. Exhibits no edema.  Lymphadenopathy:    No cervical adenopathy.  Neurological: Alert and oriented to person, place, and time. Exhibits normal muscle tone. Gait normal. Coordination normal.  Skin: Skin is warm and dry. No rash noted. Not diaphoretic. No erythema. No pallor.  Psychiatric: Mood, memory and judgment normal. Positive for some memory impairment.  Vitals reviewed.  LABORATORY DATA: Lab Results  Component Value Date   WBC 5.1 06/21/2024   HGB 9.4 (L) 06/21/2024   HCT 28.8 (L) 06/21/2024   MCV 90.6 06/21/2024   PLT 166 06/21/2024  Chemistry      Component Value Date/Time   NA 141 06/21/2024 1006   NA 142 10/12/2021 1043   K 3.6 06/21/2024 1006   CL 108 06/21/2024 1006   CO2 27 06/21/2024 1006   BUN 40 (H) 06/21/2024 1006   BUN 17 10/12/2021 1043   CREATININE 2.10 (H) 06/21/2024 1006      Component Value Date/Time   CALCIUM 9.1 06/21/2024 1006   ALKPHOS 54 06/21/2024 1006   AST 23 06/21/2024 1006   ALT 9 06/21/2024 1006   BILITOT 0.5 06/21/2024 1006       RADIOGRAPHIC STUDIES:  No results found.   ASSESSMENT/PLAN:  This is a very pleasant 78 year old African-American female  with stage IV (T4, N2, M1 B) non-small cell lung cancer, adenocarcinoma.  She presented with a large right infrahilar mass in addition to right hilar and mediastinal lymphadenopathy as well as a left adrenal metastasis.  She was diagnosed in December 2024.  She had molecular studies performed by [facility] that showed no actionable mutations.  Her PD-L1 expression is 90%.   The patient is currently undergoing chemotherapy and immunotherapy with carboplatin  for an AUC of 5, Alimta  500 mg/m, Keytruda  200 mg IV every 3 weeks.  She is status post 9 cycles.  Starting from cycle #3, Dr. Sherrod reduced the dose of her chemotherapy due to pancytopenia starting from cycle #4 with carboplatin  for an AUC of 4 and Alimta  400 mg/m.  Starting from cycle #5, Dr. Sherrod discontinued her chemotherapy.  Starting from cycle #6 she will be on single agent maintenance Keytruda .  The patient was seen with Dr. Sherrod today.  Dr. Sherrod personally and independently reviewed the scan and discussed results with the patient today.  The scan showed no evidence of disease progression.   Labs were reviewed. Recommend she proceed with cycle #10 today as scheduled with single agent keytruda .   I will refer her to nephrology for her CKD. She is ok to treat with her creatinine of 2.3 today. She was advised to take her anti-hypertensive when she refers home.   I also will refer her to cardiology to establish care for monitoring and recommendations for the pericardial effusion.   We will see her back for follow-up visit in 3 weeks for evaluation and repeat blood work before undergoing cycle #11.  The patient was advised to call immediately if she has any concerning symptoms in the interval. The patient voices understanding of current disease status and treatment options and is in agreement with the current care plan. All questions were answered. The patient knows to call the clinic with any problems, questions or concerns. We  can certainly see the patient much sooner if necessary       No orders of the defined types were placed in this encounter.    Corvette Orser L Keldric Poyer, PA-C 06/28/24  ADDENDUM: Hematology/Oncology Attending: I had a face-to-face encounter with the patient today.  I reviewed her records, lab, scan and recommended her care plan.  This is a very pleasant 78 years old African-American female with a stage IV non-small cell lung cancer, adenocarcinoma diagnosed in December 2024 with no actionable mutations and PD-L1 expression of 90%.  The patient is currently undergoing palliative systemic chemoimmunotherapy initially with carboplatin , Alimta  and Keytruda  every 3 weeks for 4 cycles and starting from cycle #5 she was on maintenance Alimta  and Keytruda  and then started from cycle #6 only single agent Keytruda  and Alimta  was discontinued secondary to renal insufficiency.  The patient has been tolerating this treatment fairly well. She had repeat CT scan of the chest, abdomen and pelvis performed recently.  I personally and independently reviewed the scan and discussed the result with the patient and her daughter who was available by phone at the time. Her scan showed no concerning findings for disease progression. I recommended for the patient to continue her current treatment with single agent Keytruda .  She will proceed with cycle #10 today. She will come back for follow-up visit in 3 weeks for evaluation before starting cycle #11. For the renal sufficiency we will refer the patient to nephrology for further evaluation and management. The patient was advised to call immediately if she has any other concerning symptoms in the interval. The total time spent in the appointment was 30 minutes including review of chart and various tests results, discussions about plan of care and coordination of care plan . Disclaimer: This note was dictated with voice recognition software. Similar sounding words can  inadvertently be transcribed and may be missed upon review. Sherrod MARLA Sherrod, MD

## 2024-07-04 ENCOUNTER — Inpatient Hospital Stay

## 2024-07-04 ENCOUNTER — Inpatient Hospital Stay (HOSPITAL_BASED_OUTPATIENT_CLINIC_OR_DEPARTMENT_OTHER): Admitting: Physician Assistant

## 2024-07-04 VITALS — BP 142/92 | HR 70 | Temp 97.0°F | Resp 15 | Wt 145.5 lb

## 2024-07-04 DIAGNOSIS — Z79899 Other long term (current) drug therapy: Secondary | ICD-10-CM | POA: Diagnosis not present

## 2024-07-04 DIAGNOSIS — C3431 Malignant neoplasm of lower lobe, right bronchus or lung: Secondary | ICD-10-CM | POA: Diagnosis not present

## 2024-07-04 DIAGNOSIS — D61818 Other pancytopenia: Secondary | ICD-10-CM | POA: Diagnosis not present

## 2024-07-04 DIAGNOSIS — Z5112 Encounter for antineoplastic immunotherapy: Secondary | ICD-10-CM

## 2024-07-04 DIAGNOSIS — I3139 Other pericardial effusion (noninflammatory): Secondary | ICD-10-CM | POA: Diagnosis not present

## 2024-07-04 DIAGNOSIS — R918 Other nonspecific abnormal finding of lung field: Secondary | ICD-10-CM

## 2024-07-04 DIAGNOSIS — R7989 Other specified abnormal findings of blood chemistry: Secondary | ICD-10-CM | POA: Diagnosis not present

## 2024-07-04 DIAGNOSIS — Z87891 Personal history of nicotine dependence: Secondary | ICD-10-CM | POA: Diagnosis not present

## 2024-07-04 LAB — CMP (CANCER CENTER ONLY)
ALT: 11 U/L (ref 0–44)
AST: 24 U/L (ref 15–41)
Albumin: 3.5 g/dL (ref 3.5–5.0)
Alkaline Phosphatase: 55 U/L (ref 38–126)
Anion gap: 7 (ref 5–15)
BUN: 28 mg/dL — ABNORMAL HIGH (ref 8–23)
CO2: 26 mmol/L (ref 22–32)
Calcium: 8.9 mg/dL (ref 8.9–10.3)
Chloride: 109 mmol/L (ref 98–111)
Creatinine: 2.32 mg/dL — ABNORMAL HIGH (ref 0.44–1.00)
GFR, Estimated: 21 mL/min — ABNORMAL LOW (ref >60)
Glucose, Bld: 88 mg/dL (ref 70–99)
Potassium: 3.3 mmol/L — ABNORMAL LOW (ref 3.5–5.1)
Sodium: 142 mmol/L (ref 135–145)
Total Bilirubin: 0.5 mg/dL (ref 0.0–1.2)
Total Protein: 8 g/dL (ref 6.5–8.1)

## 2024-07-04 LAB — CBC WITH DIFFERENTIAL (CANCER CENTER ONLY)
Abs Immature Granulocytes: 0.01 K/uL (ref 0.00–0.07)
Basophils Absolute: 0 K/uL (ref 0.0–0.1)
Basophils Relative: 0 %
Eosinophils Absolute: 0.1 K/uL (ref 0.0–0.5)
Eosinophils Relative: 1 %
HCT: 29.3 % — ABNORMAL LOW (ref 36.0–46.0)
Hemoglobin: 9.5 g/dL — ABNORMAL LOW (ref 12.0–15.0)
Immature Granulocytes: 0 %
Lymphocytes Relative: 34 %
Lymphs Abs: 1.8 K/uL (ref 0.7–4.0)
MCH: 28.7 pg (ref 26.0–34.0)
MCHC: 32.4 g/dL (ref 30.0–36.0)
MCV: 88.5 fL (ref 80.0–100.0)
Monocytes Absolute: 0.8 K/uL (ref 0.1–1.0)
Monocytes Relative: 15 %
Neutro Abs: 2.7 K/uL (ref 1.7–7.7)
Neutrophils Relative %: 50 %
Platelet Count: 165 K/uL (ref 150–400)
RBC: 3.31 MIL/uL — ABNORMAL LOW (ref 3.87–5.11)
RDW: 13.9 % (ref 11.5–15.5)
WBC Count: 5.4 K/uL (ref 4.0–10.5)
nRBC: 0 % (ref 0.0–0.2)

## 2024-07-04 MED ORDER — SODIUM CHLORIDE 0.9 % IV SOLN
200.0000 mg | Freq: Once | INTRAVENOUS | Status: AC
  Administered 2024-07-04: 200 mg via INTRAVENOUS
  Filled 2024-07-04: qty 8

## 2024-07-04 MED ORDER — SODIUM CHLORIDE 0.9 % IV SOLN: INTRAVENOUS | Status: DC

## 2024-07-04 MED ORDER — SODIUM CHLORIDE 0.9% FLUSH: 10.0000 mL | INTRAVENOUS | Status: DC | PRN

## 2024-07-04 MED ORDER — SODIUM CHLORIDE 0.9% FLUSH
10.0000 mL | Freq: Once | INTRAVENOUS | Status: AC
  Administered 2024-07-04: 10 mL

## 2024-07-04 NOTE — Patient Instructions (Signed)
 [facility] - A DEPT OF [facility]   Discharge Instructions: Thank you for choosing [facility] to provide your oncology and hematology care.   If you have a lab appointment with the Cancer Center, please go directly to the Cancer Center and check in at the registration area.   Wear comfortable clothing and clothing appropriate for easy access to any Portacath or PICC line.   We strive to give you quality time with your provider. You may need to reschedule your appointment if you arrive late (15 or more minutes).  Arriving late affects you and other patients whose appointments are after yours.  Also, if you miss three or more appointments without notifying the office, you may be dismissed from the clinic at the provider's discretion.      For prescription refill requests, have your pharmacy contact our office and allow 72 hours for refills to be completed.    Today you received the following chemotherapy and/or immunotherapy agents: Pembrolizumab Rande Lawman)      To help prevent nausea and vomiting after your treatment, we encourage you to take your nausea medication as directed.  BELOW ARE SYMPTOMS THAT SHOULD BE REPORTED IMMEDIATELY: *FEVER GREATER THAN 100.4 F (38 C) OR HIGHER *CHILLS OR SWEATING *NAUSEA AND VOMITING THAT IS NOT CONTROLLED WITH YOUR NAUSEA MEDICATION *UNUSUAL SHORTNESS OF BREATH *UNUSUAL BRUISING OR BLEEDING *URINARY PROBLEMS (pain or burning when urinating, or frequent urination) *BOWEL PROBLEMS (unusual diarrhea, constipation, pain near the anus) TENDERNESS IN MOUTH AND THROAT WITH OR WITHOUT PRESENCE OF ULCERS (sore throat, sores in mouth, or a toothache) UNUSUAL RASH, SWELLING OR PAIN  UNUSUAL VAGINAL DISCHARGE OR ITCHING   Items with * indicate a potential emergency and should be followed up as soon as possible or go to the Emergency Department if any problems should occur.  Please show the CHEMOTHERAPY ALERT CARD  or IMMUNOTHERAPY ALERT CARD at check-in to the Emergency Department and triage nurse.  Should you have questions after your visit or need to cancel or reschedule your appointment, please contact [facility] - A DEPT OF [facility]  Dept: [phone]  and follow the prompts.  Office hours are 8:00 a.m. to 4:30 p.m. Monday - Friday. Please note that voicemails left after 4:00 p.m. may not be returned until the following business day.  We are closed weekends and major holidays. You have access to a nurse at all times for urgent questions. Please call the main number to the clinic Dept: [phone] and follow the prompts.   For any non-urgent questions, you may also contact your provider using MyChart. We now offer e-Visits for anyone 75 and older to request care online for non-urgent symptoms. For details visit [email or web address].   Also download the MyChart app! Go to the app store, search "MyChart", open the app, select , and log in with your MyChart username and password.

## 2024-07-05 ENCOUNTER — Inpatient Hospital Stay

## 2024-07-10 DIAGNOSIS — H268 Other specified cataract: Secondary | ICD-10-CM | POA: Diagnosis not present

## 2024-07-10 DIAGNOSIS — I10 Essential (primary) hypertension: Secondary | ICD-10-CM | POA: Diagnosis not present

## 2024-07-10 DIAGNOSIS — H2512 Age-related nuclear cataract, left eye: Secondary | ICD-10-CM | POA: Diagnosis not present

## 2024-07-12 ENCOUNTER — Inpatient Hospital Stay: Attending: Internal Medicine

## 2024-07-12 DIAGNOSIS — Z5112 Encounter for antineoplastic immunotherapy: Secondary | ICD-10-CM | POA: Diagnosis not present

## 2024-07-12 DIAGNOSIS — Z79899 Other long term (current) drug therapy: Secondary | ICD-10-CM | POA: Diagnosis not present

## 2024-07-12 DIAGNOSIS — C3431 Malignant neoplasm of lower lobe, right bronchus or lung: Secondary | ICD-10-CM | POA: Insufficient documentation

## 2024-07-12 DIAGNOSIS — D61818 Other pancytopenia: Secondary | ICD-10-CM | POA: Diagnosis not present

## 2024-07-12 DIAGNOSIS — R918 Other nonspecific abnormal finding of lung field: Secondary | ICD-10-CM

## 2024-07-12 DIAGNOSIS — D649 Anemia, unspecified: Secondary | ICD-10-CM

## 2024-07-12 LAB — CBC WITH DIFFERENTIAL (CANCER CENTER ONLY)
Abs Immature Granulocytes: 0.01 K/uL (ref 0.00–0.07)
Basophils Absolute: 0 K/uL (ref 0.0–0.1)
Basophils Relative: 0 %
Eosinophils Absolute: 0 K/uL (ref 0.0–0.5)
Eosinophils Relative: 1 %
HCT: 28.9 % — ABNORMAL LOW (ref 36.0–46.0)
Hemoglobin: 9.2 g/dL — ABNORMAL LOW (ref 12.0–15.0)
Immature Granulocytes: 0 %
Lymphocytes Relative: 33 %
Lymphs Abs: 1.6 K/uL (ref 0.7–4.0)
MCH: 28.1 pg (ref 26.0–34.0)
MCHC: 31.8 g/dL (ref 30.0–36.0)
MCV: 88.4 fL (ref 80.0–100.0)
Monocytes Absolute: 0.8 K/uL (ref 0.1–1.0)
Monocytes Relative: 16 %
Neutro Abs: 2.4 K/uL (ref 1.7–7.7)
Neutrophils Relative %: 50 %
Platelet Count: 172 K/uL (ref 150–400)
RBC: 3.27 MIL/uL — ABNORMAL LOW (ref 3.87–5.11)
RDW: 13.9 % (ref 11.5–15.5)
WBC Count: 4.8 K/uL (ref 4.0–10.5)
nRBC: 0 % (ref 0.0–0.2)

## 2024-07-12 MED ORDER — SODIUM CHLORIDE 0.9% FLUSH
10.0000 mL | Freq: Once | INTRAVENOUS | Status: AC
Start: 2024-07-12 — End: 2024-07-12
  Administered 2024-07-12: 10 mL

## 2024-07-17 ENCOUNTER — Other Ambulatory Visit: Payer: Self-pay

## 2024-07-19 ENCOUNTER — Telehealth: Payer: Self-pay

## 2024-07-19 ENCOUNTER — Inpatient Hospital Stay

## 2024-07-19 DIAGNOSIS — C3431 Malignant neoplasm of lower lobe, right bronchus or lung: Secondary | ICD-10-CM | POA: Diagnosis not present

## 2024-07-19 DIAGNOSIS — Z5112 Encounter for antineoplastic immunotherapy: Secondary | ICD-10-CM | POA: Diagnosis not present

## 2024-07-19 DIAGNOSIS — Z79899 Other long term (current) drug therapy: Secondary | ICD-10-CM | POA: Diagnosis not present

## 2024-07-19 DIAGNOSIS — D61818 Other pancytopenia: Secondary | ICD-10-CM | POA: Diagnosis not present

## 2024-07-19 LAB — CBC WITH DIFFERENTIAL (CANCER CENTER ONLY)
Abs Immature Granulocytes: 0.02 K/uL (ref 0.00–0.07)
Basophils Absolute: 0 K/uL (ref 0.0–0.1)
Basophils Relative: 0 %
Eosinophils Absolute: 0 K/uL (ref 0.0–0.5)
Eosinophils Relative: 1 %
HCT: 29.2 % — ABNORMAL LOW (ref 36.0–46.0)
Hemoglobin: 9.3 g/dL — ABNORMAL LOW (ref 12.0–15.0)
Immature Granulocytes: 0 %
Lymphocytes Relative: 31 %
Lymphs Abs: 1.7 K/uL (ref 0.7–4.0)
MCH: 27.8 pg (ref 26.0–34.0)
MCHC: 31.8 g/dL (ref 30.0–36.0)
MCV: 87.2 fL (ref 80.0–100.0)
Monocytes Absolute: 0.8 K/uL (ref 0.1–1.0)
Monocytes Relative: 15 %
Neutro Abs: 2.9 K/uL (ref 1.7–7.7)
Neutrophils Relative %: 53 %
Platelet Count: 198 K/uL (ref 150–400)
RBC: 3.35 MIL/uL — ABNORMAL LOW (ref 3.87–5.11)
RDW: 14.4 % (ref 11.5–15.5)
WBC Count: 5.5 K/uL (ref 4.0–10.5)
nRBC: 0 % (ref 0.0–0.2)

## 2024-07-19 NOTE — Telephone Encounter (Signed)
 Patient called and left a voicemail regarding diarrhea for the past 5 days. She is inquiring about what medication she can take for relief.  Returned call and spoke with the patient. She reported having consumed cantaloupe, watermelon, and kiwi over the past week. Advised patient that she may take over-the-counter Imodium as directed on the packaging. Instructed her to notify the clinic over the weekend if symptoms do not improve. Patient voiced understanding and expressed thanks.

## 2024-07-26 ENCOUNTER — Other Ambulatory Visit: Payer: Self-pay

## 2024-07-26 ENCOUNTER — Inpatient Hospital Stay

## 2024-07-26 ENCOUNTER — Inpatient Hospital Stay (HOSPITAL_COMMUNITY)
Admission: EM | Admit: 2024-07-26 | Discharge: 2024-08-02 | DRG: 481 | Disposition: A | Discharge status: Skilled Nursing Facility | Attending: Internal Medicine | Admitting: Internal Medicine

## 2024-07-26 ENCOUNTER — Emergency Department (HOSPITAL_COMMUNITY)

## 2024-07-26 ENCOUNTER — Ambulatory Visit: Payer: Self-pay | Admitting: Student

## 2024-07-26 ENCOUNTER — Inpatient Hospital Stay: Admitting: Internal Medicine

## 2024-07-26 ENCOUNTER — Encounter (HOSPITAL_COMMUNITY): Payer: Self-pay

## 2024-07-26 VITALS — Resp 18

## 2024-07-26 VITALS — BP 120/70 | HR 66 | Temp 98.2°F | Resp 96 | Ht 64.0 in | Wt 142.9 lb

## 2024-07-26 DIAGNOSIS — K0889 Other specified disorders of teeth and supporting structures: Secondary | ICD-10-CM | POA: Diagnosis present

## 2024-07-26 DIAGNOSIS — S72009A Fracture of unspecified part of neck of unspecified femur, initial encounter for closed fracture: Secondary | ICD-10-CM | POA: Diagnosis present

## 2024-07-26 DIAGNOSIS — S72001A Fracture of unspecified part of neck of right femur, initial encounter for closed fracture: Principal | ICD-10-CM

## 2024-07-26 DIAGNOSIS — T451X5A Adverse effect of antineoplastic and immunosuppressive drugs, initial encounter: Secondary | ICD-10-CM | POA: Diagnosis present

## 2024-07-26 DIAGNOSIS — M1711 Unilateral primary osteoarthritis, right knee: Secondary | ICD-10-CM | POA: Diagnosis not present

## 2024-07-26 DIAGNOSIS — Z781 Physical restraint status: Secondary | ICD-10-CM | POA: Diagnosis not present

## 2024-07-26 DIAGNOSIS — Z7982 Long term (current) use of aspirin: Secondary | ICD-10-CM

## 2024-07-26 DIAGNOSIS — I129 Hypertensive chronic kidney disease with stage 1 through stage 4 chronic kidney disease, or unspecified chronic kidney disease: Secondary | ICD-10-CM | POA: Diagnosis present

## 2024-07-26 DIAGNOSIS — Y9301 Activity, walking, marching and hiking: Secondary | ICD-10-CM | POA: Diagnosis present

## 2024-07-26 DIAGNOSIS — I10 Essential (primary) hypertension: Secondary | ICD-10-CM | POA: Diagnosis not present

## 2024-07-26 DIAGNOSIS — D649 Anemia, unspecified: Secondary | ICD-10-CM

## 2024-07-26 DIAGNOSIS — I3139 Other pericardial effusion (noninflammatory): Secondary | ICD-10-CM | POA: Diagnosis not present

## 2024-07-26 DIAGNOSIS — K521 Toxic gastroenteritis and colitis: Secondary | ICD-10-CM | POA: Diagnosis present

## 2024-07-26 DIAGNOSIS — D696 Thrombocytopenia, unspecified: Secondary | ICD-10-CM | POA: Diagnosis present

## 2024-07-26 DIAGNOSIS — Z841 Family history of disorders of kidney and ureter: Secondary | ICD-10-CM

## 2024-07-26 DIAGNOSIS — Z79899 Other long term (current) drug therapy: Secondary | ICD-10-CM | POA: Diagnosis not present

## 2024-07-26 DIAGNOSIS — Z9089 Acquired absence of other organs: Secondary | ICD-10-CM

## 2024-07-26 DIAGNOSIS — Z7969 Long term (current) use of other immunomodulators and immunosuppressants: Secondary | ICD-10-CM

## 2024-07-26 DIAGNOSIS — Z8249 Family history of ischemic heart disease and other diseases of the circulatory system: Secondary | ICD-10-CM

## 2024-07-26 DIAGNOSIS — R2681 Unsteadiness on feet: Secondary | ICD-10-CM | POA: Diagnosis not present

## 2024-07-26 DIAGNOSIS — Z751 Person awaiting admission to adequate facility elsewhere: Secondary | ICD-10-CM

## 2024-07-26 DIAGNOSIS — D72829 Elevated white blood cell count, unspecified: Secondary | ICD-10-CM | POA: Diagnosis not present

## 2024-07-26 DIAGNOSIS — D638 Anemia in other chronic diseases classified elsewhere: Secondary | ICD-10-CM | POA: Diagnosis not present

## 2024-07-26 DIAGNOSIS — M85851 Other specified disorders of bone density and structure, right thigh: Secondary | ICD-10-CM | POA: Diagnosis present

## 2024-07-26 DIAGNOSIS — R531 Weakness: Secondary | ICD-10-CM | POA: Diagnosis not present

## 2024-07-26 DIAGNOSIS — S72144D Nondisplaced intertrochanteric fracture of right femur, subsequent encounter for closed fracture with routine healing: Secondary | ICD-10-CM | POA: Diagnosis not present

## 2024-07-26 DIAGNOSIS — M25551 Pain in right hip: Secondary | ICD-10-CM | POA: Diagnosis not present

## 2024-07-26 DIAGNOSIS — Z87442 Personal history of urinary calculi: Secondary | ICD-10-CM | POA: Diagnosis not present

## 2024-07-26 DIAGNOSIS — Z87891 Personal history of nicotine dependence: Secondary | ICD-10-CM | POA: Diagnosis not present

## 2024-07-26 DIAGNOSIS — I517 Cardiomegaly: Secondary | ICD-10-CM | POA: Diagnosis not present

## 2024-07-26 DIAGNOSIS — N184 Chronic kidney disease, stage 4 (severe): Secondary | ICD-10-CM | POA: Diagnosis not present

## 2024-07-26 DIAGNOSIS — S72141A Displaced intertrochanteric fracture of right femur, initial encounter for closed fracture: Secondary | ICD-10-CM | POA: Diagnosis not present

## 2024-07-26 DIAGNOSIS — C3431 Malignant neoplasm of lower lobe, right bronchus or lung: Secondary | ICD-10-CM | POA: Diagnosis present

## 2024-07-26 DIAGNOSIS — W010XXA Fall on same level from slipping, tripping and stumbling without subsequent striking against object, initial encounter: Secondary | ICD-10-CM | POA: Diagnosis not present

## 2024-07-26 DIAGNOSIS — Z743 Need for continuous supervision: Secondary | ICD-10-CM | POA: Diagnosis not present

## 2024-07-26 DIAGNOSIS — Z9889 Other specified postprocedural states: Secondary | ICD-10-CM | POA: Diagnosis not present

## 2024-07-26 DIAGNOSIS — W19XXXA Unspecified fall, initial encounter: Secondary | ICD-10-CM | POA: Diagnosis not present

## 2024-07-26 DIAGNOSIS — W109XXA Fall (on) (from) unspecified stairs and steps, initial encounter: Secondary | ICD-10-CM | POA: Diagnosis present

## 2024-07-26 DIAGNOSIS — M85861 Other specified disorders of bone density and structure, right lower leg: Secondary | ICD-10-CM | POA: Diagnosis not present

## 2024-07-26 DIAGNOSIS — S8991XA Unspecified injury of right lower leg, initial encounter: Secondary | ICD-10-CM | POA: Diagnosis not present

## 2024-07-26 DIAGNOSIS — E8809 Other disorders of plasma-protein metabolism, not elsewhere classified: Secondary | ICD-10-CM | POA: Diagnosis not present

## 2024-07-26 DIAGNOSIS — E441 Mild protein-calorie malnutrition: Secondary | ICD-10-CM | POA: Diagnosis not present

## 2024-07-26 DIAGNOSIS — M8580 Other specified disorders of bone density and structure, unspecified site: Secondary | ICD-10-CM | POA: Diagnosis not present

## 2024-07-26 DIAGNOSIS — R1311 Dysphagia, oral phase: Secondary | ICD-10-CM | POA: Diagnosis not present

## 2024-07-26 DIAGNOSIS — M84651A Pathological fracture in other disease, right femur, initial encounter for fracture: Secondary | ICD-10-CM | POA: Diagnosis not present

## 2024-07-26 DIAGNOSIS — C3490 Malignant neoplasm of unspecified part of unspecified bronchus or lung: Secondary | ICD-10-CM | POA: Diagnosis not present

## 2024-07-26 DIAGNOSIS — E876 Hypokalemia: Secondary | ICD-10-CM | POA: Diagnosis not present

## 2024-07-26 DIAGNOSIS — Y92099 Unspecified place in other non-institutional residence as the place of occurrence of the external cause: Secondary | ICD-10-CM

## 2024-07-26 DIAGNOSIS — R918 Other nonspecific abnormal finding of lung field: Secondary | ICD-10-CM | POA: Diagnosis not present

## 2024-07-26 DIAGNOSIS — Z9842 Cataract extraction status, left eye: Secondary | ICD-10-CM

## 2024-07-26 DIAGNOSIS — Z043 Encounter for examination and observation following other accident: Secondary | ICD-10-CM | POA: Diagnosis not present

## 2024-07-26 DIAGNOSIS — M6281 Muscle weakness (generalized): Secondary | ICD-10-CM | POA: Diagnosis not present

## 2024-07-26 DIAGNOSIS — M160 Bilateral primary osteoarthritis of hip: Secondary | ICD-10-CM | POA: Diagnosis not present

## 2024-07-26 DIAGNOSIS — I499 Cardiac arrhythmia, unspecified: Secondary | ICD-10-CM | POA: Diagnosis not present

## 2024-07-26 DIAGNOSIS — Z7401 Bed confinement status: Secondary | ICD-10-CM | POA: Diagnosis not present

## 2024-07-26 DIAGNOSIS — Z6824 Body mass index (BMI) 24.0-24.9, adult: Secondary | ICD-10-CM

## 2024-07-26 DIAGNOSIS — M25572 Pain in left ankle and joints of left foot: Secondary | ICD-10-CM | POA: Diagnosis not present

## 2024-07-26 LAB — CMP (CANCER CENTER ONLY)
ALT: 8 U/L (ref 0–44)
AST: 23 U/L (ref 15–41)
Albumin: 3.6 g/dL (ref 3.5–5.0)
Alkaline Phosphatase: 55 U/L (ref 38–126)
Anion gap: 8 (ref 5–15)
BUN: 26 mg/dL — ABNORMAL HIGH (ref 8–23)
CO2: 28 mmol/L (ref 22–32)
Calcium: 9.2 mg/dL (ref 8.9–10.3)
Chloride: 107 mmol/L (ref 98–111)
Creatinine: 1.99 mg/dL — ABNORMAL HIGH (ref 0.44–1.00)
GFR, Estimated: 25 mL/min — ABNORMAL LOW (ref >60)
Glucose, Bld: 91 mg/dL (ref 70–99)
Potassium: 2.8 mmol/L — ABNORMAL LOW (ref 3.5–5.1)
Sodium: 143 mmol/L (ref 135–145)
Total Bilirubin: 0.5 mg/dL (ref 0.0–1.2)
Total Protein: 8.5 g/dL — ABNORMAL HIGH (ref 6.5–8.1)

## 2024-07-26 LAB — CBC WITH DIFFERENTIAL (CANCER CENTER ONLY)
Abs Immature Granulocytes: 0.01 K/uL (ref 0.00–0.07)
Basophils Absolute: 0 K/uL (ref 0.0–0.1)
Basophils Relative: 0 %
Eosinophils Absolute: 0 K/uL (ref 0.0–0.5)
Eosinophils Relative: 0 %
HCT: 28.8 % — ABNORMAL LOW (ref 36.0–46.0)
Hemoglobin: 9.4 g/dL — ABNORMAL LOW (ref 12.0–15.0)
Immature Granulocytes: 0 %
Lymphocytes Relative: 31 %
Lymphs Abs: 1.9 K/uL (ref 0.7–4.0)
MCH: 28 pg (ref 26.0–34.0)
MCHC: 32.6 g/dL (ref 30.0–36.0)
MCV: 85.7 fL (ref 80.0–100.0)
Monocytes Absolute: 0.9 K/uL (ref 0.1–1.0)
Monocytes Relative: 16 %
Neutro Abs: 3.1 K/uL (ref 1.7–7.7)
Neutrophils Relative %: 53 %
Platelet Count: 176 K/uL (ref 150–400)
RBC: 3.36 MIL/uL — ABNORMAL LOW (ref 3.87–5.11)
RDW: 14.3 % (ref 11.5–15.5)
WBC Count: 6 K/uL (ref 4.0–10.5)
nRBC: 0 % (ref 0.0–0.2)

## 2024-07-26 LAB — PROTIME-INR
INR: 1.1 (ref 0.8–1.2)
Prothrombin Time: 14.7 s (ref 11.4–15.2)

## 2024-07-26 LAB — CBC WITH DIFFERENTIAL/PLATELET
Abs Immature Granulocytes: 0.03 K/uL (ref 0.00–0.07)
Basophils Absolute: 0 K/uL (ref 0.0–0.1)
Basophils Relative: 0 %
Eosinophils Absolute: 0 K/uL (ref 0.0–0.5)
Eosinophils Relative: 0 %
HCT: 29 % — ABNORMAL LOW (ref 36.0–46.0)
Hemoglobin: 9 g/dL — ABNORMAL LOW (ref 12.0–15.0)
Immature Granulocytes: 0 %
Lymphocytes Relative: 14 %
Lymphs Abs: 1.1 K/uL (ref 0.7–4.0)
MCH: 28 pg (ref 26.0–34.0)
MCHC: 31 g/dL (ref 30.0–36.0)
MCV: 90.1 fL (ref 80.0–100.0)
Monocytes Absolute: 0.8 K/uL (ref 0.1–1.0)
Monocytes Relative: 10 %
Neutro Abs: 5.7 K/uL (ref 1.7–7.7)
Neutrophils Relative %: 76 %
Platelets: 158 K/uL (ref 150–400)
RBC: 3.22 MIL/uL — ABNORMAL LOW (ref 3.87–5.11)
RDW: 14.3 % (ref 11.5–15.5)
WBC: 7.6 K/uL (ref 4.0–10.5)
nRBC: 0 % (ref 0.0–0.2)

## 2024-07-26 LAB — BASIC METABOLIC PANEL WITH GFR
Anion gap: 14 (ref 5–15)
BUN: 22 mg/dL (ref 8–23)
CO2: 23 mmol/L (ref 22–32)
Calcium: 9.3 mg/dL (ref 8.9–10.3)
Chloride: 105 mmol/L (ref 98–111)
Creatinine, Ser: 1.89 mg/dL — ABNORMAL HIGH (ref 0.44–1.00)
GFR, Estimated: 27 mL/min — ABNORMAL LOW (ref >60)
Glucose, Bld: 99 mg/dL (ref 70–99)
Potassium: 2.6 mmol/L — CL (ref 3.5–5.1)
Sodium: 142 mmol/L (ref 135–145)

## 2024-07-26 MED ORDER — LACTATED RINGERS IV SOLN: INTRAVENOUS | Status: DC

## 2024-07-26 MED ORDER — POVIDONE-IODINE 10 % EX SWAB
2.0000 | Freq: Once | CUTANEOUS | Status: AC
Start: 2024-07-27 — End: 2024-07-27
  Administered 2024-07-27: 2 via TOPICAL

## 2024-07-26 MED ORDER — CEFAZOLIN SODIUM-DEXTROSE 2-4 GM/100ML-% IV SOLN
2.0000 g | INTRAVENOUS | Status: AC
  Administered 2024-07-27: 2 g via INTRAVENOUS
  Filled 2024-07-26: qty 100

## 2024-07-26 MED ORDER — CHLORHEXIDINE GLUCONATE 4 % EX SOLN
60.0000 mL | Freq: Once | CUTANEOUS | Status: AC
  Administered 2024-07-27: 4 via TOPICAL
  Filled 2024-07-26: qty 60

## 2024-07-26 MED ORDER — SODIUM CHLORIDE 0.9 % IV SOLN
200.0000 mg | Freq: Once | INTRAVENOUS | Status: AC
  Administered 2024-07-26: 200 mg via INTRAVENOUS
  Filled 2024-07-26: qty 8

## 2024-07-26 MED ORDER — POTASSIUM CHLORIDE 20 MEQ PO PACK
40.0000 meq | PACK | Freq: Once | ORAL | Status: AC
  Administered 2024-07-26: 40 meq via ORAL
  Filled 2024-07-26: qty 2

## 2024-07-26 MED ORDER — KETOROLAC TROMETHAMINE 0.5 % OP SOLN
1.0000 [drp] | Freq: Two times a day (BID) | OPHTHALMIC | Status: DC
Start: 2024-07-26 — End: 2024-08-02
  Administered 2024-07-27 – 2024-08-02 (×14): 1 [drp] via OPHTHALMIC
  Filled 2024-07-26: qty 5

## 2024-07-26 MED ORDER — ONDANSETRON HCL 4 MG/2ML IJ SOLN
4.0000 mg | Freq: Once | INTRAMUSCULAR | Status: AC
  Administered 2024-07-26: 4 mg via INTRAVENOUS
  Filled 2024-07-26: qty 2

## 2024-07-26 MED ORDER — MORPHINE SULFATE (PF) 2 MG/ML IV SOLN: 0.5000 mg | INTRAVENOUS | Status: DC | PRN

## 2024-07-26 MED ORDER — LACTATED RINGERS IV SOLN
INTRAVENOUS | Status: AC
Start: 2024-07-26 — End: 2024-07-27

## 2024-07-26 MED ORDER — METHOCARBAMOL 1000 MG/10ML IJ SOLN: 500.0000 mg | Freq: Four times a day (QID) | INTRAMUSCULAR | Status: DC | PRN

## 2024-07-26 MED ORDER — TRANEXAMIC ACID-NACL 1000-0.7 MG/100ML-% IV SOLN
1000.0000 mg | INTRAVENOUS | Status: AC
  Administered 2024-07-27: 1000 mg via INTRAVENOUS
  Filled 2024-07-26: qty 100

## 2024-07-26 MED ORDER — METHOCARBAMOL 500 MG PO TABS: 500.0000 mg | ORAL_TABLET | Freq: Four times a day (QID) | ORAL | Status: DC | PRN

## 2024-07-26 MED ORDER — POTASSIUM CHLORIDE CRYS ER 20 MEQ PO TBCR: 20.0000 meq | EXTENDED_RELEASE_TABLET | Freq: Two times a day (BID) | ORAL | 0 refills | Status: AC

## 2024-07-26 MED ORDER — SODIUM CHLORIDE 0.9% FLUSH: 10.0000 mL | INTRAVENOUS | Status: DC | PRN

## 2024-07-26 MED ORDER — CYCLOSPORINE 0.05 % OP EMUL: 1.0000 [drp] | Freq: Two times a day (BID) | OPHTHALMIC | Status: DC

## 2024-07-26 MED ORDER — PREDNISOLONE ACETATE 1 % OP SUSP
1.0000 [drp] | Freq: Three times a day (TID) | OPHTHALMIC | Status: DC
  Administered 2024-07-27 – 2024-08-02 (×20): 1 [drp] via OPHTHALMIC
  Filled 2024-07-26: qty 5

## 2024-07-26 MED ORDER — SODIUM CHLORIDE 0.9 % IV SOLN: INTRAVENOUS | Status: DC

## 2024-07-26 MED ORDER — ACETAMINOPHEN 500 MG PO TABS
1000.0000 mg | ORAL_TABLET | Freq: Once | ORAL | Status: AC
  Administered 2024-07-27: 1000 mg via ORAL
  Filled 2024-07-26: qty 2

## 2024-07-26 MED ORDER — MORPHINE SULFATE (PF) 4 MG/ML IV SOLN
4.0000 mg | Freq: Once | INTRAVENOUS | Status: AC
  Administered 2024-07-26: 4 mg via INTRAVENOUS
  Filled 2024-07-26: qty 1

## 2024-07-26 MED ORDER — POTASSIUM CHLORIDE 10 MEQ/100ML IV SOLN
10.0000 meq | INTRAVENOUS | Status: AC
  Administered 2024-07-26 (×3): 10 meq via INTRAVENOUS
  Filled 2024-07-26 (×3): qty 100

## 2024-07-26 MED ORDER — AMLODIPINE BESYLATE 10 MG PO TABS
10.0000 mg | ORAL_TABLET | Freq: Every day | ORAL | Status: DC
  Administered 2024-07-27 – 2024-08-02 (×7): 10 mg via ORAL
  Filled 2024-07-26 (×2): qty 1
  Filled 2024-07-26: qty 2
  Filled 2024-07-26 (×4): qty 1

## 2024-07-26 MED ORDER — GATIFLOXACIN 0.5 % OP SOLN
1.0000 [drp] | Freq: Three times a day (TID) | OPHTHALMIC | Status: DC
  Filled 2024-07-26: qty 2.5

## 2024-07-26 MED ORDER — HYDROCODONE-ACETAMINOPHEN 5-325 MG PO TABS
1.0000 | ORAL_TABLET | Freq: Four times a day (QID) | ORAL | Status: DC | PRN
  Administered 2024-07-29: 1 via ORAL
  Administered 2024-08-01: 2 via ORAL
  Filled 2024-07-26: qty 2
  Filled 2024-07-26: qty 1

## 2024-07-26 NOTE — ED Triage Notes (Signed)
 Pt BIB ems for a witnessed fall by her daughter. Pt fell on her right hip. Had her infusion today. Did not hit her head, not on blood thinners, not DM. Received 400ml of NS. Pedal pulses present and strong. Hx. HTN, lung cancer. A&Ox4, VS stable

## 2024-07-26 NOTE — ED Provider Notes (Signed)
 [place] EMERGENCY DEPARTMENT AT [facility] Provider Note   CSN: [id number] Arrival date & time: 07/26/24  1614     Patient presents with: Tami Shea is a 78 y.o. female history of stage IV non-small cell lung cancer, CKD here presenting with fall.  Patient just finished her infusion at [facility] this afternoon.  She was walking and then tripped and fell and landed on her right hip.  Patient states that afterwards, she was unable to walk.  Denies any head injury or other injuries.  Patient is not currently on blood thinners.   The history is provided by the patient.       Prior to Admission medications   Medication Sig Start Date End Date Taking? Authorizing Provider  amLODipine  (NORVASC ) 10 MG tablet 1 tablet    [provider]  cephALEXin  (KEFLEX ) 250 MG capsule Take 1 capsule (250 mg total) by mouth 3 (three) times daily. 04/11/24   Heilingoetter, Cassandra L, PA-C  Cholecalciferol (VITAMIN D3) 1.25 MG (50000 UT) CAPS Take 1 capsule by mouth once a week. 10/03/23   [provider]  folic acid  (FOLVITE ) 1 MG tablet Take 1 tablet (1 mg total) by mouth daily. Start 7 days before pemetrexed  chemotherapy. Continue until 21 days after pemetrexed  completed. 12/01/23   Sherrod Sherrod, MD  lidocaine -prilocaine  (EMLA ) cream Apply to affected area once 12/01/23   Sherrod Sherrod, MD  loperamide (IMODIUM) 2 MG capsule Take 2 mg by mouth as needed for diarrhea or loose stools (pt has been taking daily).    [provider]  LORazepam  (ATIVAN ) 0.5 MG tablet Take 1 tablet by mouth 30-60 minutes before your scan 02/08/24   Heilingoetter, Cassandra L, PA-C  ondansetron  (ZOFRAN ) 8 MG tablet Take 1 tablet (8 mg total) by mouth every 8 (eight) hours as needed for nausea or vomiting. Start on the third Hernandes after carboplatin . Patient not taking: Reported on 04/11/2024 12/01/23   Sherrod Sherrod, MD  potassium chloride  SA (KLOR-CON  M) 20 MEQ tablet Take 1 tablet (20  mEq total) by mouth 2 (two) times daily. 07/26/24   Sherrod Sherrod, MD  prochlorperazine  (COMPAZINE ) 10 MG tablet Take 1 tablet (10 mg total) by mouth every 6 (six) hours as needed for nausea or vomiting. 12/01/23   Sherrod Sherrod, MD  RESTASIS  0.05 % ophthalmic emulsion Place 1 drop into both eyes 2 (two) times daily. 01/02/24   [provider]    Allergies: Patient has no known allergies.    Review of Systems  Musculoskeletal:        Right hip pain  All other systems reviewed and are negative.   Updated Vital Signs BP (!) 128/91 (BP Location: Right Arm)   Pulse 65   Temp 98.5 F (36.9 C) (Oral)   Resp 20   SpO2 98%   Physical Exam Vitals and nursing note reviewed.  Constitutional:      Appearance: Normal appearance.  HENT:     Head: Normocephalic and atraumatic.     Nose: Nose normal.     Mouth/Throat:     Mouth: Mucous membranes are moist.  Eyes:     Extraocular Movements: Extraocular movements intact.     Pupils: Pupils are equal, round, and reactive to light.  Cardiovascular:     Rate and Rhythm: Normal rate.     Pulses: Normal pulses.     Heart sounds: Normal heart sounds.  Pulmonary:     Effort: Pulmonary effort is normal.  Breath sounds: Normal breath sounds.  Abdominal:     General: Abdomen is flat.     Palpations: Abdomen is soft.  Musculoskeletal:     Cervical back: Normal range of motion.     Comments: Right hip is rotated.  Patient is unable to range the right hip.  No obvious knee or tib-fib tenderness.  No other extremity trauma  Skin:    General: Skin is warm.     Capillary Refill: Capillary refill takes less than 2 seconds.  Neurological:     General: No focal deficit present.     Mental Status: She is alert and oriented to person, place, and time.  Psychiatric:        Mood and Affect: Mood normal.        Behavior: Behavior normal.     (all labs ordered are listed, but only abnormal results are displayed) Labs Reviewed  CBC  WITH DIFFERENTIAL/PLATELET  BASIC METABOLIC PANEL WITH GFR    EKG: None  Radiology: No results found.   Procedures   Medications Ordered in the ED  morphine  (PF) 4 MG/ML injection 4 mg (has no administration in time range)  ondansetron  (ZOFRAN ) injection 4 mg (has no administration in time range)                                    Medical Decision Making Tami Shea is a 78 y.o. female here presenting with right hip injury.  Concern for possible right hip fracture.  Will get hip x-ray and preop labs.  Will give pain medicine and reassess  6:03 PM Xray showed L intertrochanteric fracture. I discussed with Dr. Fidel from ortho.  6:48 PM Hospitalist to admit.  N.p.o. after midnight  Problems Addressed: Closed fracture of right hip, initial encounter [facility]): acute illness or injury  Amount and/or Complexity of Data Reviewed Labs: ordered. Decision-making details documented in ED Course. Radiology: ordered and independent interpretation performed. Decision-making details documented in ED Course.  Risk Prescription drug management.    Final diagnoses:  None    ED Discharge Orders     None          Patt Alm Macho, MD 07/26/24 [facility]

## 2024-07-26 NOTE — H&P (Signed)
 [facility] H&P    Patient Demographics:    Tami Shea, is a 78 y.o. female  MRN: [id number]  DOB - [birth year]  Admit Date - 07/26/2024  Referring MD/NP/PA: Alm Cave  Outpatient Primary MD for the patient is [name], MD  Patient coming from: Home  Chief complaint-fall   HPI:    Tami Shea  is a 78 y.o. female, with medical history of stage IV non-small cell lung cancer, adenocarcinoma, who is currently undergoing palliative systemic chemo immunotherapy at cancer center.  Patient is on treatment with Keytruda  every 3 weeks.  Patient went home after treatment from cancer center and then about 2 enter her home she missed a step and fell on right hip.  After that patient was unable to walk. Patient was brought to the ED, x-ray of the hips was obtained which showed right intertrochanteric femur fracture.  Orthopedics was consulted for management. Patient denies dizziness or passing out. Denies chest pain or shortness of breath Denies nausea and vomiting. Continues to have diarrhea for past few weeks     Review of systems:    In addition to the HPI above,    All other systems reviewed and are negative.    Past History of the following :    Past Medical History:  Diagnosis Date   Dyspnea    History of kidney stones    HTN (hypertension)    Smoker       Past Surgical History:  Procedure Laterality Date   BRONCHIAL NEEDLE ASPIRATION BIOPSY  10/24/2023   Procedure: BRONCHIAL NEEDLE ASPIRATION BIOPSIES;  Surgeon: Brenna Adine CROME, DO;  Location: [facility];  Service: Pulmonary;;   COLONOSCOPY     IR IMAGING GUIDED PORT INSERTION  12/09/2023   PILONIDAL CYST EXCISION     tonisllectomy     VIDEO BRONCHOSCOPY WITH ENDOBRONCHIAL ULTRASOUND Right 10/24/2023   Procedure: VIDEO BRONCHOSCOPY WITH ENDOBRONCHIAL ULTRASOUND;  Surgeon: Brenna Adine CROME, DO;  Location: [facility];  Service: Pulmonary;   Laterality: Right;      Social History:      Social History   Tobacco Use   Smoking status: Former    Current packs/[place]: 0.00    Types: Cigarettes    Quit date: 09/24/2023    Years since quitting: 0.8   Smokeless tobacco: Never  Substance Use Topics   Alcohol  use: Not Currently       Family History :     Family History  Problem Relation Age of Onset   Kidney disease Mother    Heart disease Father       Home Medications:   Prior to Admission medications   Medication Sig Start Date End Date Taking? Authorizing Provider  gatifloxacin  (ZYMAXID ) 0.5 % SOLN Place 1 drop into the left eye 3 (three) times daily. 07/02/24  Yes [provider]  ketorolac  (ACULAR ) 0.5 % ophthalmic solution Place 1 drop into the left eye 2 (two) times daily. 07/02/24  Yes [provider]  prednisoLONE  acetate (PRED FORTE ) 1 % ophthalmic suspension Place 1 drop into  the left eye 3 (three) times daily. 07/02/24  Yes [provider]  amLODipine  (NORVASC ) 10 MG tablet 1 tablet    [provider]  cephALEXin  (KEFLEX ) 250 MG capsule Take 1 capsule (250 mg total) by mouth 3 (three) times daily. 04/11/24   Heilingoetter, Cassandra L, PA-C  Cholecalciferol (VITAMIN D3) 1.25 MG (50000 UT) CAPS Take 1 capsule by mouth once a week. 10/03/23   [provider]  folic acid  (FOLVITE ) 1 MG tablet Take 1 tablet (1 mg total) by mouth daily. Start 7 days before pemetrexed  chemotherapy. Continue until 21 days after pemetrexed  completed. 12/01/23   Sherrod Sherrod, MD  lidocaine -prilocaine  (EMLA ) cream Apply to affected area once 12/01/23   Sherrod Sherrod, MD  loperamide (IMODIUM) 2 MG capsule Take 2 mg by mouth as needed for diarrhea or loose stools (pt has been taking daily).    [provider]  LORazepam  (ATIVAN ) 0.5 MG tablet Take 1 tablet by mouth 30-60 minutes before your scan 02/08/24   Heilingoetter, Cassandra L, PA-C  ondansetron  (ZOFRAN ) 8 MG tablet Take 1 tablet  (8 mg total) by mouth every 8 (eight) hours as needed for nausea or vomiting. Start on the third Warne after carboplatin . Patient not taking: Reported on 04/11/2024 12/01/23   Sherrod Sherrod, MD  potassium chloride  SA (KLOR-CON  M) 20 MEQ tablet Take 1 tablet (20 mEq total) by mouth 2 (two) times daily. 07/26/24   Sherrod Sherrod, MD  prochlorperazine  (COMPAZINE ) 10 MG tablet Take 1 tablet (10 mg total) by mouth every 6 (six) hours as needed for nausea or vomiting. 12/01/23   Sherrod Sherrod, MD  RESTASIS  0.05 % ophthalmic emulsion Place 1 drop into both eyes 2 (two) times daily. 01/02/24   [provider]     Allergies:    No Known Allergies   Physical Exam:   Vitals  Blood pressure (!) 145/88, pulse 81, temperature 98.6 F (37 C), temperature source Oral, resp. rate 18, SpO2 95%.  1.  General: Appears in no acute distress  2. Psychiatric: Alert, oriented x 3, intact insight and judgment  3. Neurologic: Cranial nerves II through XII grossly intact, no focal deficit noted  4. HEENMT:  Atraumatic normocephalic, extraocular muscles are intact  5. Respiratory : Lungs clear to auscultation bilaterally  6. Cardiovascular : S1-S2, regular, grade 3/6 systolic murmur auscultated at apex and aortic area  7. Gastrointestinal:  Abdomen is soft, nontender, no organomegaly  8. Skin:  No rashes noted  9.Musculoskeletal:  Right lower extremity is shortened, externally rotated, tenderness at right hip    Data Review:    CBC Recent Labs  Lab 07/26/24 1017 07/26/24 1727  WBC 6.0 7.6  HGB 9.4* 9.0*  HCT 28.8* 29.0*  PLT 176 158  MCV 85.7 90.1  MCH 28.0 28.0  MCHC 32.6 31.0  RDW 14.3 14.3  LYMPHSABS 1.9 1.1  MONOABS 0.9 0.8  EOSABS 0.0 0.0  BASOSABS 0.0 0.0   ------------------------------------------------------------------------------------------------------------------  Results for orders placed or performed during the hospital encounter of 07/26/24 (from the  past 48 hours)  CBC with Differential     Status: Abnormal   Collection Time: 07/26/24  5:27 PM  Result Value Ref Range   WBC 7.6 4.0 - 10.5 K/uL   RBC 3.22 (L) 3.87 - 5.11 MIL/uL   Hemoglobin 9.0 (L) 12.0 - 15.0 g/dL   HCT 70.9 (L) 63.9 - 53.9 %   MCV 90.1 80.0 - 100.0 fL   MCH 28.0 26.0 - 34.0 pg  MCHC 31.0 30.0 - 36.0 g/dL   RDW 85.6 88.4 - 84.4 %   Platelets 158 150 - 400 K/uL   nRBC 0.0 0.0 - 0.2 %   Neutrophils Relative % 76 %   Neutro Abs 5.7 1.7 - 7.7 K/uL   Lymphocytes Relative 14 %   Lymphs Abs 1.1 0.7 - 4.0 K/uL   Monocytes Relative 10 %   Monocytes Absolute 0.8 0.1 - 1.0 K/uL   Eosinophils Relative 0 %   Eosinophils Absolute 0.0 0.0 - 0.5 K/uL   Basophils Relative 0 %   Basophils Absolute 0.0 0.0 - 0.1 K/uL   Immature Granulocytes 0 %   Abs Immature Granulocytes 0.03 0.00 - 0.07 K/uL    Comment: Performed at [facility], [address] [place], [place] [address]  Basic metabolic panel     Status: Abnormal   Collection Time: 07/26/24  5:27 PM  Result Value Ref Range   Sodium 142 135 - 145 mmol/L   Potassium 2.6 (LL) 3.5 - 5.1 mmol/L    Comment: Critical Value, Read Back and verified with M.BLACK, RN AT [phone] ON 07/26/25 BY N.THOMPSON   Chloride 105 98 - 111 mmol/L   CO2 23 22 - 32 mmol/L   Glucose, Bld 99 70 - 99 mg/dL    Comment: Glucose reference range applies only to samples taken after fasting for at least 8 hours.   BUN 22 8 - 23 mg/dL   Creatinine, Ser 8.10 (H) 0.44 - 1.00 mg/dL   Calcium 9.3 8.9 - 89.6 mg/dL   GFR, Estimated 27 (L) >60 mL/min    Comment: (NOTE) Calculated using the CKD-EPI Creatinine Equation (2021)    Anion gap 14 5 - 15    Comment: Performed at [facility], [address] [place], [place] [address]  Type and screen     Status: None   Collection Time: 07/26/24  5:59 PM  Result Value Ref Range   ABO/RH(D) O POS    Antibody Screen NEG    Sample Expiration      07/29/2024,2359 Performed at  [facility], [address] [place], [place] [address]   Protime-INR     Status: None   Collection Time: 07/26/24  5:59 PM  Result Value Ref Range   Prothrombin Time 14.7 11.4 - 15.2 seconds   INR 1.1 0.8 - 1.2    Comment: (NOTE) INR goal varies based on device and disease states. Performed at [facility], [address] [place], [place] [address]     Chemistries  Recent Labs  Lab 07/26/24 1017 07/26/24 1727  NA 143 142  K 2.8* 2.6*  CL 107 105  CO2 28 23  GLUCOSE 91 99  BUN 26* 22  CREATININE 1.99* 1.89*  CALCIUM 9.2 9.3  AST 23  --   ALT 8  --   ALKPHOS 55  --   BILITOT 0.5  --    ------------------------------------------------------------------------------------------------------------------  ------------------------------------------------------------------------------------------------------------------ GFR: Estimated Creatinine Clearance: 21.2 mL/min (A) (by C-G formula based on SCr of 1.89 mg/dL (H)). Liver Function Tests: Recent Labs  Lab 07/26/24 1017  AST 23  ALT 8  ALKPHOS 55  BILITOT 0.5  PROT 8.5*  ALBUMIN 3.6   No results for input(s): LIPASE, AMYLASE in the last 168 hours. No results for input(s): AMMONIA in the last 168 hours. Coagulation Profile: Recent Labs  Lab 07/26/24 1759  INR 1.1   Cardiac Enzymes: No results for input(s): CKTOTAL,  CKMB, CKMBINDEX, TROPONINI in the last 168 hours. BNP (last 3 results) No results for input(s): PROBNP in the last 8760 hours. HbA1C: No results for input(s): HGBA1C in the last 72 hours. CBG: No results for input(s): GLUCAP in the last 168 hours. Lipid Profile: No results for input(s): CHOL, HDL, LDLCALC, TRIG, CHOLHDL, LDLDIRECT in the last 72 hours. Thyroid  Function Tests: No results for input(s): TSH, T4TOTAL, FREET4, T3FREE, THYROIDAB in the last 72 hours. Anemia Panel: No results for input(s): VITAMINB12,  FOLATE, FERRITIN, TIBC, IRON, RETICCTPCT in the last 72 hours.  --------------------------------------------------------------------------------------------------------------- Urine analysis: No results found for: COLORURINE, APPEARANCEUR, LABSPEC, PHURINE, GLUCOSEU, HGBUR, BILIRUBINUR, KETONESUR, PROTEINUR, UROBILINOGEN, NITRITE, LEUKOCYTESUR    Imaging Results:    DG Knee Right Port Result Date: 07/26/2024 CLINICAL DATA:  Fall and trauma to the right knee. EXAM: PORTABLE RIGHT KNEE - 1-2 VIEW COMPARISON:  None Available. FINDINGS: There is no acute fracture or dislocation. The bones are osteopenic. Moderate arthritic changes of the knee. No significant effusion. The soft tissues are unremarkable. IMPRESSION: 1. No acute fracture or dislocation. 2. Moderate arthritic changes. Electronically Signed   By: Vanetta Chou M.D.   On: 07/26/2024 18:53   DG Hip Unilat W or Wo Pelvis 2-3 Views Right Result Date: 07/26/2024 CLINICAL DATA:  fall EXAM: DG HIP (WITH OR WITHOUT PELVIS) 2-3V RIGHT COMPARISON:  June 28, 2024 FINDINGS: Osteopenia.No evidence of pelvic fracture or diastasis.Nondisplaced intertrochanteric fracture of the right hip. No dislocation.Multilevel degenerative disc disease of the spine. Mild osteoarthritis of both hips. Peripheral vascular atherosclerosis. IMPRESSION: Osteopenia. Nondisplaced, intertrochanteric fracture of the right hip. Electronically Signed   By: Rogelia Myers M.D.   On: 07/26/2024 17:24   DG Chest 1 View Result Date: 07/26/2024 CLINICAL DATA:  fall EXAM: CHEST  1 VIEW COMPARISON:  June 28, 2024 FINDINGS: Lower lung volumes. Bilateral perihilar interstitial opacities. Moderate cardiomegaly with superimposed large pericardial effusion again noted. Right chest port in place terminating at the cavoatrial junction. Tortuous aorta with aortic atherosclerosis. No acute fracture or destructive lesions. Multilevel thoracic osteophytosis.  IMPRESSION: 1. Bilateral perihilar interstitial opacities,, which may represent bronchovascular crowding due to low lung volumes, interstitial edema, or atypical/viral infection, in the correct clinical context. 2. Moderate cardiomegaly and superimposed pericardial effusion, unchanged. Electronically Signed   By: Rogelia Myers M.D.   On: 07/26/2024 17:22       Assessment & Plan:    Principal Problem:   Hip fracture (HCC)   Right femur fracture-x-ray shows osteopenia, nondisplaced intertrochanteric fracture of right hip.  Will keep n.p.o. after midnight.  DVT prophylaxis as per orthopedics.  ED provider spoke to orthopedic surgeon, plan for surgery in AM.  Hypokalemia-potassium was low at 2.6, likely in setting of diarrhea.  Will check serum magnesium .  Replace potassium and follow BMP in am.  Recent cataract surgery-continue ophthalmologic eyedrops.  Non-small cell lung cancer-followed by oncology as outpatient.  Received cycle #7 of Keytruda  today.  Diarrhea-likely in setting of chemotherapy.  Continue to monitor.  Start gentle hydration with LR at 75 mL/h for 12 hours.    DVT Prophylaxis-   as per orthopedics  AM Labs Ordered, also please review Full Orders  Family Communication: Admission, patients condition and plan of care including tests being ordered have been discussed with the patient and her granddaughter at bedside who indicate understanding and agree with the plan and Code Status.  Code Status: Full code  Admission status: Observation/Inpatient :The appropriate admission status for this patient is INPATIENT. Inpatient  status is judged to be reasonable and necessary in order to provide the required intensity of service to ensure the patient's safety. The patient's presenting symptoms, physical exam findings, and initial radiographic and laboratory data in the context of their chronic comorbidities is felt to place them at high risk for further clinical deterioration.  Furthermore, it is not anticipated that the patient will be medically stable for discharge from the hospital within 2 midnights of admission. The following factors support the admission status of inpatient.         * I certify that at the point of admission it is my clinical judgment that the patient will require inpatient hospital care spanning beyond 2 midnights from the point of admission due to high intensity of service, high risk for further deterioration and high frequency of surveillance required.*  Time spent in minutes : 60 min   Loetta Connelley S Miya Luviano M.D

## 2024-07-26 NOTE — Progress Notes (Addendum)
 Patient had a fall earlier today. X-rays showed nondisplaced right intertrochanteric femur fracture. Orthopedics consulted for management. Plan for admit to [facility] for perioperative medical optimization. Plan for right IM fixation tomorrow with Dr. Fidel. NPO MN tonight. Hold chemical DVT ppx. Full consult to follow in the morning.

## 2024-07-26 NOTE — Patient Instructions (Signed)
 [facility] - A DEPT OF [facility]  Discharge Instructions: Thank you for choosing [facility] to provide your oncology and hematology care.   If you have a lab appointment with the Cancer Center, please go directly to the Cancer Center and check in at the registration area.   Wear comfortable clothing and clothing appropriate for easy access to any Portacath or PICC line.   We strive to give you quality time with your provider. You may need to reschedule your appointment if you arrive late (15 or more minutes).  Arriving late affects you and other patients whose appointments are after yours.  Also, if you miss three or more appointments without notifying the office, you may be dismissed from the clinic at the provider's discretion.      For prescription refill requests, have your pharmacy contact our office and allow 72 hours for refills to be completed.    Today you received the following chemotherapy and/or immunotherapy agents keytruda       To help prevent nausea and vomiting after your treatment, we encourage you to take your nausea medication as directed.  BELOW ARE SYMPTOMS THAT SHOULD BE REPORTED IMMEDIATELY: *FEVER GREATER THAN 100.4 F (38 C) OR HIGHER *CHILLS OR SWEATING *NAUSEA AND VOMITING THAT IS NOT CONTROLLED WITH YOUR NAUSEA MEDICATION *UNUSUAL SHORTNESS OF BREATH *UNUSUAL BRUISING OR BLEEDING *URINARY PROBLEMS (pain or burning when urinating, or frequent urination) *BOWEL PROBLEMS (unusual diarrhea, constipation, pain near the anus) TENDERNESS IN MOUTH AND THROAT WITH OR WITHOUT PRESENCE OF ULCERS (sore throat, sores in mouth, or a toothache) UNUSUAL RASH, SWELLING OR PAIN  UNUSUAL VAGINAL DISCHARGE OR ITCHING   Items with * indicate a potential emergency and should be followed up as soon as possible or go to the Emergency Department if any problems should occur.  Please show the CHEMOTHERAPY ALERT CARD or IMMUNOTHERAPY  ALERT CARD at check-in to the Emergency Department and triage nurse.  Should you have questions after your visit or need to cancel or reschedule your appointment, please contact [facility] - A DEPT OF [facility]  Dept: [phone]  and follow the prompts.  Office hours are 8:00 a.m. to 4:30 p.m. Monday - Friday. Please note that voicemails left after 4:00 p.m. may not be returned until the following business day.  We are closed weekends and major holidays. You have access to a nurse at all times for urgent questions. Please call the main number to the clinic Dept: [phone] and follow the prompts.   For any non-urgent questions, you may also contact your provider using MyChart. We now offer e-Visits for anyone 47 and older to request care online for non-urgent symptoms. For details visit [email or web address].   Also download the MyChart app! Go to the app store, search MyChart, open the app, select [place], and log in with your MyChart username and password.

## 2024-07-26 NOTE — Anesthesia Preprocedure Evaluation (Signed)
 Anesthesia Evaluation  Patient identified by MRN, date of birth, ID band Patient awake    Reviewed: Allergy & Precautions, NPO status , Patient's Chart, lab work & pertinent test results  Airway Mallampati: II  TM Distance: >3 FB Neck ROM: Full    Dental  (+) Dental Advisory Given, Teeth Intact, Missing, Poor Dentition   Pulmonary shortness of breath, Patient abstained from smoking., former smoker   Pulmonary exam normal breath sounds clear to auscultation       Cardiovascular hypertension, Pt. on medications Normal cardiovascular exam Rhythm:Regular Rate:Normal     Neuro/Psych negative neurological ROS  negative psych ROS   GI/Hepatic negative GI ROS, Neg liver ROS,,,  Endo/Other  negative endocrine ROS    Renal/GU negative Renal ROS     Musculoskeletal negative musculoskeletal ROS (+)    Abdominal   Peds  Hematology  (+) Blood dyscrasia, anemia   Anesthesia Other Findings Right lung mass   Reproductive/Obstetrics                              Anesthesia Physical Anesthesia Plan  ASA: 3  Anesthesia Plan: General   Post-op Pain Management: Tylenol  PO (pre-op)*   Induction: Intravenous  PONV Risk Score and Plan: 4 or greater and Ondansetron , Dexamethasone  and Treatment may vary due to age or medical condition  Airway Management Planned: Oral ETT and Video Laryngoscope Planned  Additional Equipment:   Intra-op Plan:   Post-operative Plan: Extubation in OR  Informed Consent: I have reviewed the patients History and Physical, chart, labs and discussed the procedure including the risks, benefits and alternatives for the proposed anesthesia with the patient or authorized representative who has indicated his/her understanding and acceptance.     Dental advisory given  Plan Discussed with: CRNA  Anesthesia Plan Comments:         Anesthesia Quick Evaluation

## 2024-07-26 NOTE — ED Notes (Signed)
 Pt to Xray

## 2024-07-26 NOTE — ED Notes (Signed)
 Hospitalist at bedside

## 2024-07-26 NOTE — Progress Notes (Signed)
 [facility] [phone]   [phone]  OFFICE PROGRESS NOTE  [name], MD [address] [place] [address]  DIAGNOSIS: stage IV (T4, N2, M1 B) non-small cell lung cancer, adenocarcinoma presented with large right infrahilar mass in addition to right hilar and mediastinal lymphadenopathy as well as left adrenal gland metastasis diagnosed in December 2024.   Biomarker Findings Tumor Mutational Burden - 24 Muts/Mb HRD signature - HRDsig Negative Microsatellite status - MS-Stable Genomic Findings For a complete list of the genes assayed, please refer to the Appendix. BRAF G464V KRAS G13C ASXL1 deletion exon 12 TP53 splice site 920-2A>T 6 Disease relevant genes with no reportable alterations: ALK, EGFR, ERBB2, MET, RET, ROS1  PDL1 Expression: 90%  PRIOR THERAPY: None  CURRENT THERAPY: Systemic treatment with combination of chemoimmunotherapy with carboplatin  for AUC of 5, Alimta  500 Mg/M2 and Keytruda  200 Mg IV every 3 weeks.  First dose December 08, 2023.  Status post 8 cycles.  Starting from cycle #3 her dose of carboplatin  will be for AUC of 4 and Alimta  400 Mg/M2 secondary to pancytopenia.  Starting cycle #5 she was on maintenance treatment with Alimta  and Keytruda  every 3 weeks.  Starting from cycle #6 she is on treatment with single agent Keytruda  every 3 weeks.  Alimta  discontinued secondary to toxicity.  INTERVAL HISTORY: Danity Schmelzer Bruso 78 y.o. female returns to the clinic today for follow-up visit accompanied by her daughter. Discussed the use of AI scribe software for clinical note transcription with the patient, who gave verbal consent to proceed.  History of Present Illness Raylynne Cubbage Boom is a 78 year old female with stage four non-small cell lung cancer who presents for evaluation before starting cycle number eleven of chemotherapy. She is accompanied by her daughter.  She was diagnosed with stage four non-small cell lung cancer  in December 2020 and has been undergoing palliative systemic chemo-immunotherapy. Initially, she was treated with Alimta  and Keytruda , and she has completed ten cycles of treatment with carboplatin  and Keytruda  every three weeks. She is here for evaluation before starting cycle number eleven.  She experiences diarrhea that began approximately two weeks ago. She has been taking Imodium twice a [place] for over a week, which has helped manage the symptoms. She experiences diarrhea once or twice a [place], particularly after consuming eight to ten ounces of liquid. No diarrhea more than six times a [place]. No nausea, vomiting, chest pain, or breathing issues.  She recently had cataract surgery and reports improved vision.     MEDICAL HISTORY: Past Medical History:  Diagnosis Date   Dyspnea    History of kidney stones    HTN (hypertension)    Smoker     ALLERGIES:  has no known allergies.  MEDICATIONS:  Current Outpatient Medications  Medication Sig Dispense Refill   amLODipine  (NORVASC ) 10 MG tablet 1 tablet     cephALEXin  (KEFLEX ) 250 MG capsule Take 1 capsule (250 mg total) by mouth 3 (three) times daily. 15 capsule 0   Cholecalciferol (VITAMIN D3) 1.25 MG (50000 UT) CAPS Take 1 capsule by mouth once a week.     folic acid  (FOLVITE ) 1 MG tablet Take 1 tablet (1 mg total) by mouth daily. Start 7 days before pemetrexed  chemotherapy. Continue until 21 days after pemetrexed  completed. 100 tablet 3   lidocaine -prilocaine  (EMLA ) cream Apply to affected area once 30 g 3   LORazepam  (ATIVAN ) 0.5 MG tablet Take 1 tablet by  mouth 30-60 minutes before your scan 2 tablet 0   ondansetron  (ZOFRAN ) 8 MG tablet Take 1 tablet (8 mg total) by mouth every 8 (eight) hours as needed for nausea or vomiting. Start on the third [place] after carboplatin . (Patient not taking: Reported on 04/11/2024) 30 tablet 1   prochlorperazine  (COMPAZINE ) 10 MG tablet Take 1 tablet (10 mg total) by mouth every 6 (six) hours as needed for  nausea or vomiting. 30 tablet 1   RESTASIS  0.05 % ophthalmic emulsion Place 1 drop into both eyes 2 (two) times daily.     No current facility-administered medications for this visit.    SURGICAL HISTORY:  Past Surgical History:  Procedure Laterality Date   BRONCHIAL NEEDLE ASPIRATION BIOPSY  10/24/2023   Procedure: BRONCHIAL NEEDLE ASPIRATION BIOPSIES;  Surgeon: Brenna Adine CROME, DO;  Location: [facility];  Service: Pulmonary;;   COLONOSCOPY     IR IMAGING GUIDED PORT INSERTION  12/09/2023   PILONIDAL CYST EXCISION     tonisllectomy     VIDEO BRONCHOSCOPY WITH ENDOBRONCHIAL ULTRASOUND Right 10/24/2023   Procedure: VIDEO BRONCHOSCOPY WITH ENDOBRONCHIAL ULTRASOUND;  Surgeon: Brenna Adine CROME, DO;  Location: [facility];  Service: Pulmonary;  Laterality: Right;    REVIEW OF SYSTEMS:  A comprehensive review of systems was negative except for: Constitutional: positive for fatigue Gastrointestinal: positive for diarrhea   PHYSICAL EXAMINATION: General appearance: alert, cooperative, fatigued, and no distress Head: Normocephalic, without obvious abnormality, atraumatic Neck: no adenopathy, no JVD, supple, symmetrical, trachea midline, and thyroid  not enlarged, symmetric, no tenderness/mass/nodules Lymph nodes: Cervical, supraclavicular, and axillary nodes normal. Resp: clear to auscultation bilaterally Back: symmetric, no curvature. ROM normal. No CVA tenderness. Cardio: regular rate and rhythm, S1, S2 normal, no murmur, click, rub or gallop GI: soft, non-tender; bowel sounds normal; no masses,  no organomegaly Extremities: extremities normal, atraumatic, no cyanosis or edema  ECOG PERFORMANCE STATUS: 1 - Symptomatic but completely ambulatory  Blood pressure 120/70, pulse 66, temperature 98.2 F (36.8 C), resp. rate (!) 96, height 5' 4 (1.626 m), weight 142 lb 14.4 oz (64.8 kg).  LABORATORY DATA: Lab Results  Component Value Date   WBC 6.0 07/26/2024   HGB 9.4 (L) 07/26/2024    HCT 28.8 (L) 07/26/2024   MCV 85.7 07/26/2024   PLT 176 07/26/2024      Chemistry      Component Value Date/Time   NA 142 07/04/2024 0836   NA 142 10/12/2021 1043   K 3.3 (L) 07/04/2024 0836   CL 109 07/04/2024 0836   CO2 26 07/04/2024 0836   BUN 28 (H) 07/04/2024 0836   BUN 17 10/12/2021 1043   CREATININE 2.32 (H) 07/04/2024 0836      Component Value Date/Time   CALCIUM 8.9 07/04/2024 0836   ALKPHOS 55 07/04/2024 0836   AST 24 07/04/2024 0836   ALT 11 07/04/2024 0836   BILITOT 0.5 07/04/2024 0836       RADIOGRAPHIC STUDIES: CT CHEST ABDOMEN PELVIS WO CONTRAST Result Date: 07/04/2024 CLINICAL DATA:  Non-small-cell lung cancer restaging, chemotherapy, ongoing Keytruda  * Tracking Code: BO * EXAM: CT CHEST, ABDOMEN AND PELVIS WITHOUT CONTRAST TECHNIQUE: Multidetector CT imaging of the chest, abdomen and pelvis was performed following the standard protocol without IV contrast. RADIATION DOSE REDUCTION: This exam was performed according to the departmental dose-optimization program which includes automated exposure control, adjustment of the mA and/or kV according to patient size and/or use of iterative reconstruction technique. COMPARISON:  04/26/2024 FINDINGS: CT CHEST FINDINGS Cardiovascular: Right chest  port catheter. Aortic atherosclerosis. Cardiomegaly. Three-vessel coronary artery calcifications. Unchanged large pericardial effusion. Mediastinum/Nodes: No enlarged mediastinal, hilar, or axillary lymph nodes. Thyroid  gland, trachea, and esophagus demonstrate no significant findings. Lungs/Pleura: Unchanged post treatment appearance of the chest with treated mass of the perihilar right lower lobe and near complete atelectasis or consolidation of the right lower lobe (series 2, image 32) as well as suprahilar mixed solid and cystic lesion and associated scarring (series 4, image 47). Unchanged heterogeneous mass with internal fatty attenuation in the perihilar right middle lobe, probably  an incidental hamartoma (series 4, image 76). Unchanged small, loculated appearing right pleural effusion. Unchanged 0.2 cm nodule of the left lower lobe (series 4, image 89). Musculoskeletal: No chest wall abnormality. No acute osseous findings. CT ABDOMEN PELVIS FINDINGS Hepatobiliary: No solid liver abnormality is seen. Sludge in the gallbladder without discrete gallstones. Gallbladder wall thickening, or biliary dilatation. Pancreas: Unremarkable. No pancreatic ductal dilatation or surrounding inflammatory changes. Spleen: Normal in size without significant abnormality. Adrenals/Urinary Tract: Adrenal glands are unremarkable. Large nonobstructive calculus in the midportion of the left kidney. No right-sided calculi, ureteral calculi, or hydronephrosis. Bladder is unremarkable. Stomach/Bowel: Stomach is within normal limits. Appendix appears normal. No evidence of bowel wall thickening, distention, or inflammatory changes. Vascular/Lymphatic: Aortic atherosclerosis and vascular calcinosis. No enlarged abdominal or pelvic lymph nodes. Reproductive: No mass or other abnormality. Other: No abdominal wall hernia.  Severe anasarca.  No ascites. Musculoskeletal: No acute osseous findings. IMPRESSION: 1. Unchanged post treatment appearance of the chest with treated mass of the perihilar right lower lobe and near complete atelectasis or consolidation of the right lower lobe. 2. Unchanged small, loculated appearing right pleural effusion. 3. Unchanged 0.2 cm nodule of the left lower lobe. Attention on follow-up. 4. No noncontrast evidence of lymphadenopathy or metastatic disease in the chest, abdomen, or pelvis. 5. Unchanged large pericardial effusion. 6. Cardiomegaly and coronary artery disease. 7. Severe anasarca. 8. Nonobstructive left nephrolithiasis. Aortic Atherosclerosis (ICD10-I70.0). Electronically Signed   By: Marolyn JONETTA Jaksch M.D.   On: 07/04/2024 07:07      ASSESSMENT AND PLAN: This is a very pleasant 78  years old African-American female with  stage IV (T4, N2, M1 B) non-small cell lung cancer, adenocarcinoma presented with large right infrahilar mass in addition to right hilar and mediastinal lymphadenopathy as well as left adrenal gland metastasis diagnosed in December 2024.  She had molecular studies by [facility] that showed no actionable mutations and PD-L1 TPS of 90%. She is currently undergoing treatment with chemoimmunotherapy with carboplatin  for AUC of 5, Alimta  500 Mg/M2 and Keytruda  200 Mg IV every 3 weeks for 4 cycles followed by maintenance treatment with Alimta  and Keytruda .  Status post 7 cycle.  Starting from cycle #3 her dose of carboplatin  was reduced to AUC of 4 and Alimta  400 Mg/M2 secondary to pancytopenia.  Starting cycle #6 she will be on single agent Keytruda  every 3 weeks.  Alimta  was discontinued secondary to intolerance and persistent pancytopenia. The patient has been tolerating this treatment fairly well with no concerning adverse effects except for the fatigue and diarrhea. Assessment and Plan Assessment & Plan Stage IV non-small cell lung cancer Stage IV non-small cell lung cancer with no actionable mutations and PD-L1 expression of 90%. Currently undergoing palliative systemic chemo-immunotherapy with carboplatin  and pembrolizumab . She has completed 10 cycles and is here for evaluation before starting cycle 11. No new symptoms reported except for diarrhea, which is being managed. - Continue carboplatin  and pembrolizumab  treatment -  Evaluate before starting cycle 11  Chemotherapy-induced diarrhea Diarrhea started two weeks ago, occurring once or twice a [place]. Managed with Imodium and hydration. No severe diarrhea reported, and it is not frequent enough to warrant changes in the current treatment regimen. Immunotherapy can cause diarrhea, but current frequency is manageable. - Continue Imodium for diarrhea management - Ensure adequate hydration  Chronic kidney  disease Chronic kidney disease with creatinine levels around 2, which is well-managed and considered normal for her. Awaiting referral to a nephrologist for further evaluation and management. She was advised to call immediately if she has any concerning symptoms in the interval. The patient voices understanding of current disease status and treatment options and is in agreement with the current care plan.  All questions were answered. The patient knows to call the clinic with any problems, questions or concerns. We can certainly see the patient much sooner if necessary.  The total time spent in the appointment was 20 minutes.  Disclaimer: This note was dictated with voice recognition software. Similar sounding words can inadvertently be transcribed and may not be corrected upon review.

## 2024-07-27 ENCOUNTER — Inpatient Hospital Stay (HOSPITAL_COMMUNITY): Admitting: Anesthesiology

## 2024-07-27 ENCOUNTER — Inpatient Hospital Stay (HOSPITAL_COMMUNITY)

## 2024-07-27 ENCOUNTER — Other Ambulatory Visit: Payer: Self-pay

## 2024-07-27 ENCOUNTER — Encounter (HOSPITAL_COMMUNITY)
Admission: EM | Disposition: A | Discharge status: Skilled Nursing Facility | Payer: Self-pay | Source: Home / Self Care | Attending: Internal Medicine

## 2024-07-27 ENCOUNTER — Encounter (HOSPITAL_COMMUNITY): Payer: Self-pay | Admitting: Family Medicine

## 2024-07-27 DIAGNOSIS — Z87891 Personal history of nicotine dependence: Secondary | ICD-10-CM | POA: Diagnosis not present

## 2024-07-27 DIAGNOSIS — S72141A Displaced intertrochanteric fracture of right femur, initial encounter for closed fracture: Secondary | ICD-10-CM

## 2024-07-27 DIAGNOSIS — C3490 Malignant neoplasm of unspecified part of unspecified bronchus or lung: Secondary | ICD-10-CM | POA: Diagnosis not present

## 2024-07-27 DIAGNOSIS — I10 Essential (primary) hypertension: Secondary | ICD-10-CM

## 2024-07-27 DIAGNOSIS — S72001A Fracture of unspecified part of neck of right femur, initial encounter for closed fracture: Secondary | ICD-10-CM | POA: Diagnosis not present

## 2024-07-27 HISTORY — PX: INTRAMEDULLARY (IM) NAIL INTERTROCHANTERIC: SHX5875

## 2024-07-27 LAB — BASIC METABOLIC PANEL WITH GFR
Anion gap: 13 (ref 5–15)
BUN: 22 mg/dL (ref 8–23)
CO2: 22 mmol/L (ref 22–32)
Calcium: 9.1 mg/dL (ref 8.9–10.3)
Chloride: 107 mmol/L (ref 98–111)
Creatinine, Ser: 1.62 mg/dL — ABNORMAL HIGH (ref 0.44–1.00)
GFR, Estimated: 32 mL/min — ABNORMAL LOW (ref >60)
Glucose, Bld: 104 mg/dL — ABNORMAL HIGH (ref 70–99)
Potassium: 2.9 mmol/L — ABNORMAL LOW (ref 3.5–5.1)
Sodium: 143 mmol/L (ref 135–145)

## 2024-07-27 LAB — CBC
HCT: 27.1 % — ABNORMAL LOW (ref 36.0–46.0)
HCT: 27.2 % — ABNORMAL LOW (ref 36.0–46.0)
HCT: 35.6 % — ABNORMAL LOW (ref 36.0–46.0)
Hemoglobin: 11.4 g/dL — ABNORMAL LOW (ref 12.0–15.0)
Hemoglobin: 8.3 g/dL — ABNORMAL LOW (ref 12.0–15.0)
Hemoglobin: 8.4 g/dL — ABNORMAL LOW (ref 12.0–15.0)
MCH: 27.2 pg (ref 26.0–34.0)
MCH: 27.6 pg (ref 26.0–34.0)
MCH: 28.6 pg (ref 26.0–34.0)
MCHC: 30.5 g/dL (ref 30.0–36.0)
MCHC: 31 g/dL (ref 30.0–36.0)
MCHC: 32 g/dL (ref 30.0–36.0)
MCV: 89.1 fL (ref 80.0–100.0)
MCV: 89.2 fL (ref 80.0–100.0)
MCV: 89.2 fL (ref 80.0–100.0)
Platelets: 145 K/uL — ABNORMAL LOW (ref 150–400)
Platelets: 156 K/uL (ref 150–400)
Platelets: 159 K/uL (ref 150–400)
RBC: 3.04 MIL/uL — ABNORMAL LOW (ref 3.87–5.11)
RBC: 3.05 MIL/uL — ABNORMAL LOW (ref 3.87–5.11)
RBC: 3.99 MIL/uL (ref 3.87–5.11)
RDW: 14.2 % (ref 11.5–15.5)
RDW: 14.3 % (ref 11.5–15.5)
RDW: 14.3 % (ref 11.5–15.5)
WBC: 10.8 K/uL — ABNORMAL HIGH (ref 4.0–10.5)
WBC: 7.5 K/uL (ref 4.0–10.5)
WBC: 7.7 K/uL (ref 4.0–10.5)
nRBC: 0 % (ref 0.0–0.2)
nRBC: 0 % (ref 0.0–0.2)
nRBC: 0 % (ref 0.0–0.2)

## 2024-07-27 LAB — CREATININE, SERUM
Creatinine, Ser: 1.43 mg/dL — ABNORMAL HIGH (ref 0.44–1.00)
Creatinine, Ser: 1.65 mg/dL — ABNORMAL HIGH (ref 0.44–1.00)
GFR, Estimated: 31 mL/min — ABNORMAL LOW (ref >60)
GFR, Estimated: 37 mL/min — ABNORMAL LOW (ref >60)

## 2024-07-27 LAB — PHOSPHORUS: Phosphorus: 2.5 mg/dL (ref 2.5–4.6)

## 2024-07-27 LAB — SURGICAL PCR SCREEN
MRSA, PCR: NEGATIVE
Staphylococcus aureus: NEGATIVE

## 2024-07-27 LAB — PREPARE RBC (CROSSMATCH)

## 2024-07-27 LAB — MAGNESIUM: Magnesium: 1.8 mg/dL (ref 1.7–2.4)

## 2024-07-27 SURGERY — FIXATION, FRACTURE, INTERTROCHANTERIC, WITH INTRAMEDULLARY ROD
Anesthesia: General | Laterality: Right

## 2024-07-27 MED ORDER — ROCURONIUM BROMIDE 10 MG/ML (PF) SYRINGE
PREFILLED_SYRINGE | INTRAVENOUS | Status: DC | PRN
  Administered 2024-07-27: 50 mg via INTRAVENOUS
  Administered 2024-07-27: 10 mg via INTRAVENOUS

## 2024-07-27 MED ORDER — ONDANSETRON HCL 4 MG/2ML IJ SOLN
INTRAMUSCULAR | Status: AC
  Filled 2024-07-27: qty 2

## 2024-07-27 MED ORDER — ENSURE PLUS HIGH PROTEIN PO LIQD
237.0000 mL | Freq: Two times a day (BID) | ORAL | Status: DC
  Administered 2024-07-27 – 2024-08-02 (×8): 237 mL via ORAL

## 2024-07-27 MED ORDER — ROCURONIUM BROMIDE 10 MG/ML (PF) SYRINGE
PREFILLED_SYRINGE | INTRAVENOUS | Status: AC
  Filled 2024-07-27: qty 10

## 2024-07-27 MED ORDER — MENTHOL 3 MG MT LOZG: 1.0000 | LOZENGE | OROMUCOSAL | Status: DC | PRN

## 2024-07-27 MED ORDER — CEFAZOLIN SODIUM-DEXTROSE 2-4 GM/100ML-% IV SOLN
2.0000 g | Freq: Four times a day (QID) | INTRAVENOUS | Status: AC
  Administered 2024-07-27 (×2): 2 g via INTRAVENOUS
  Filled 2024-07-27 (×2): qty 100

## 2024-07-27 MED ORDER — METOCLOPRAMIDE HCL 5 MG/ML IJ SOLN: 5.0000 mg | Freq: Three times a day (TID) | INTRAMUSCULAR | Status: DC | PRN

## 2024-07-27 MED ORDER — HEPARIN SOD (PORK) LOCK FLUSH 100 UNIT/ML IV SOLN: 250.0000 [IU] | INTRAVENOUS | Status: DC | PRN

## 2024-07-27 MED ORDER — PROPOFOL 10 MG/ML IV BOLUS
INTRAVENOUS | Status: AC
  Filled 2024-07-27: qty 20

## 2024-07-27 MED ORDER — PHENYLEPHRINE 80 MCG/ML (10ML) SYRINGE FOR IV PUSH (FOR BLOOD PRESSURE SUPPORT)
PREFILLED_SYRINGE | INTRAVENOUS | Status: DC | PRN
  Administered 2024-07-27: 160 ug via INTRAVENOUS

## 2024-07-27 MED ORDER — PHENOL 1.4 % MT LIQD: 1.0000 | OROMUCOSAL | Status: DC | PRN

## 2024-07-27 MED ORDER — SODIUM CHLORIDE 0.9 % IV SOLN: 10.0000 mL/h | Freq: Once | INTRAVENOUS | Status: DC

## 2024-07-27 MED ORDER — DROPERIDOL 2.5 MG/ML IJ SOLN: 0.6250 mg | Freq: Once | INTRAMUSCULAR | Status: DC | PRN

## 2024-07-27 MED ORDER — HEPARIN SOD (PORK) LOCK FLUSH 100 UNIT/ML IV SOLN
500.0000 [IU] | Freq: Every day | INTRAVENOUS | Status: AC | PRN
  Administered 2024-08-02: 500 [IU]

## 2024-07-27 MED ORDER — SENNA 8.6 MG PO TABS
1.0000 | ORAL_TABLET | Freq: Two times a day (BID) | ORAL | Status: DC
Start: 2024-07-27 — End: 2024-08-02
  Administered 2024-07-27 – 2024-08-01 (×4): 8.6 mg via ORAL
  Filled 2024-07-27 (×6): qty 1

## 2024-07-27 MED ORDER — LIDOCAINE HCL (CARDIAC) PF 100 MG/5ML IV SOSY
PREFILLED_SYRINGE | INTRAVENOUS | Status: DC | PRN
  Administered 2024-07-27: 40 mg via INTRAVENOUS

## 2024-07-27 MED ORDER — ONDANSETRON HCL 4 MG PO TABS: 4.0000 mg | ORAL_TABLET | Freq: Four times a day (QID) | ORAL | Status: DC | PRN

## 2024-07-27 MED ORDER — HYDROCODONE-ACETAMINOPHEN 5-325 MG PO TABS: 1.0000 | ORAL_TABLET | ORAL | 0 refills | Status: AC | PRN

## 2024-07-27 MED ORDER — SODIUM CHLORIDE 0.9% FLUSH
10.0000 mL | Freq: Two times a day (BID) | INTRAVENOUS | Status: DC
  Administered 2024-07-27 (×2): 10 mL

## 2024-07-27 MED ORDER — 0.9 % SODIUM CHLORIDE (POUR BTL) OPTIME
TOPICAL | Status: DC | PRN
  Administered 2024-07-27: 1000 mL

## 2024-07-27 MED ORDER — DEXAMETHASONE SODIUM PHOSPHATE 10 MG/ML IJ SOLN
INTRAMUSCULAR | Status: AC
  Filled 2024-07-27: qty 1

## 2024-07-27 MED ORDER — DOCUSATE SODIUM 100 MG PO CAPS
100.0000 mg | ORAL_CAPSULE | Freq: Two times a day (BID) | ORAL | Status: DC
  Administered 2024-07-27 – 2024-08-01 (×4): 100 mg via ORAL
  Filled 2024-07-27 (×6): qty 1

## 2024-07-27 MED ORDER — ONDANSETRON HCL 4 MG/2ML IJ SOLN
INTRAMUSCULAR | Status: DC | PRN
Start: 2024-07-27 — End: 2024-07-27
  Administered 2024-07-27: 4 mg via INTRAVENOUS

## 2024-07-27 MED ORDER — CHLORHEXIDINE GLUCONATE CLOTH 2 % EX PADS
6.0000 | MEDICATED_PAD | Freq: Every day | CUTANEOUS | Status: DC
  Administered 2024-07-27: 6 via TOPICAL

## 2024-07-27 MED ORDER — SUGAMMADEX SODIUM 200 MG/2ML IV SOLN
INTRAVENOUS | Status: AC
  Filled 2024-07-27: qty 2

## 2024-07-27 MED ORDER — POTASSIUM CHLORIDE CRYS ER 20 MEQ PO TBCR
40.0000 meq | EXTENDED_RELEASE_TABLET | ORAL | Status: AC
  Administered 2024-07-27: 40 meq via ORAL
  Filled 2024-07-27: qty 2

## 2024-07-27 MED ORDER — SODIUM CHLORIDE 0.9% FLUSH: 10.0000 mL | INTRAVENOUS | Status: DC | PRN

## 2024-07-27 MED ORDER — FENTANYL CITRATE (PF) 100 MCG/2ML IJ SOLN
INTRAMUSCULAR | Status: AC
  Filled 2024-07-27: qty 2

## 2024-07-27 MED ORDER — FENTANYL CITRATE (PF) 100 MCG/2ML IJ SOLN
INTRAMUSCULAR | Status: DC | PRN
  Administered 2024-07-27 (×3): 25 ug via INTRAVENOUS

## 2024-07-27 MED ORDER — ONDANSETRON HCL 4 MG/2ML IJ SOLN: 4.0000 mg | Freq: Four times a day (QID) | INTRAMUSCULAR | Status: DC | PRN

## 2024-07-27 MED ORDER — MAGNESIUM SULFATE 2 GM/50ML IV SOLN
2.0000 g | Freq: Once | INTRAVENOUS | Status: AC
  Administered 2024-07-27: 2 g via INTRAVENOUS
  Filled 2024-07-27: qty 50

## 2024-07-27 MED ORDER — POTASSIUM CHLORIDE 10 MEQ/100ML IV SOLN
10.0000 meq | INTRAVENOUS | Status: AC
Start: 2024-07-27 — End: 2024-07-27
  Administered 2024-07-27 (×4): 10 meq via INTRAVENOUS
  Filled 2024-07-27 (×6): qty 100

## 2024-07-27 MED ORDER — SODIUM CHLORIDE 0.9% FLUSH: 3.0000 mL | INTRAVENOUS | Status: DC | PRN

## 2024-07-27 MED ORDER — LACTATED RINGERS IV SOLN: INTRAVENOUS | Status: AC

## 2024-07-27 MED ORDER — DEXAMETHASONE SODIUM PHOSPHATE 10 MG/ML IJ SOLN
INTRAMUSCULAR | Status: DC | PRN
  Administered 2024-07-27: 4 mg via INTRAVENOUS

## 2024-07-27 MED ORDER — SUGAMMADEX SODIUM 200 MG/2ML IV SOLN
INTRAVENOUS | Status: DC | PRN
  Administered 2024-07-27: 150 mg via INTRAVENOUS

## 2024-07-27 MED ORDER — PROPOFOL 10 MG/ML IV BOLUS
INTRAVENOUS | Status: DC | PRN
  Administered 2024-07-27: 80 mg via INTRAVENOUS

## 2024-07-27 MED ORDER — STERILE WATER FOR IRRIGATION IR SOLN
Status: DC | PRN
  Administered 2024-07-27: 2000 mL

## 2024-07-27 MED ORDER — SODIUM CHLORIDE 0.9% IV SOLUTION: Freq: Once | INTRAVENOUS | Status: DC

## 2024-07-27 MED ORDER — ISOPROPYL ALCOHOL 70 % SOLN
Status: DC | PRN
  Administered 2024-07-27: 1 via TOPICAL

## 2024-07-27 MED ORDER — ENOXAPARIN SODIUM 30 MG/0.3ML IJ SOSY
30.0000 mg | PREFILLED_SYRINGE | INTRAMUSCULAR | Status: DC
  Administered 2024-07-28 – 2024-08-02 (×6): 30 mg via SUBCUTANEOUS
  Filled 2024-07-27 (×6): qty 0.3

## 2024-07-27 MED ORDER — ASPIRIN 81 MG PO CHEW: 81.0000 mg | CHEWABLE_TABLET | Freq: Two times a day (BID) | ORAL | 0 refills | Status: AC

## 2024-07-27 MED ORDER — HYDROMORPHONE HCL 1 MG/ML IJ SOLN: 0.2500 mg | INTRAMUSCULAR | Status: DC | PRN

## 2024-07-27 MED ORDER — PHENYLEPHRINE HCL-NACL 20-0.9 MG/250ML-% IV SOLN
INTRAVENOUS | Status: DC | PRN
  Administered 2024-07-27: 20 ug/min via INTRAVENOUS

## 2024-07-27 MED ORDER — METOCLOPRAMIDE HCL 5 MG PO TABS: 5.0000 mg | ORAL_TABLET | Freq: Three times a day (TID) | ORAL | Status: DC | PRN

## 2024-07-27 SURGICAL SUPPLY — 37 items
BAG COUNTER SPONGE SURGICOUNT (BAG) IMPLANT
BAG ZIPLOCK 12X15 (MISCELLANEOUS) IMPLANT
BIT DRILL CANN LG 4.3MM (BIT) IMPLANT
CHLORAPREP W/TINT 26 (MISCELLANEOUS) ×1 IMPLANT
COVER PERINEAL POST (MISCELLANEOUS) ×1 IMPLANT
COVER SURGICAL LIGHT HANDLE (MISCELLANEOUS) ×1 IMPLANT
DERMABOND ADVANCED .7 DNX12 (GAUZE/BANDAGES/DRESSINGS) ×1 IMPLANT
DRAPE C-ARM 42X120 X-RAY (DRAPES) ×1 IMPLANT
DRAPE C-ARMOR (DRAPES) ×1 IMPLANT
DRAPE IMP U-DRAPE 54X76 (DRAPES) ×2 IMPLANT
DRAPE SHEET LG 3/4 BI-LAMINATE (DRAPES) ×3 IMPLANT
DRAPE STERI IOBAN 125X83 (DRAPES) ×1 IMPLANT
DRAPE U-SHAPE 47X51 STRL (DRAPES) ×2 IMPLANT
DRSG AQUACEL AG 3.5X4 (GAUZE/BANDAGES/DRESSINGS) ×1 IMPLANT
DRSG AQUACEL AG ADV 3.5X 6 (GAUZE/BANDAGES/DRESSINGS) ×1 IMPLANT
GAUZE SPONGE 4X4 12PLY STRL (GAUZE/BANDAGES/DRESSINGS) ×1 IMPLANT
GLOVE BIO SURGEON STRL SZ7 (GLOVE) ×1 IMPLANT
GLOVE BIO SURGEON STRL SZ8.5 (GLOVE) ×2 IMPLANT
GLOVE BIOGEL PI IND STRL 7.5 (GLOVE) ×1 IMPLANT
GLOVE BIOGEL PI IND STRL 8.5 (GLOVE) ×1 IMPLANT
GOWN SPEC L3 XXLG W/TWL (GOWN DISPOSABLE) ×1 IMPLANT
GOWN STRL REUS W/ TWL XL LVL3 (GOWN DISPOSABLE) ×1 IMPLANT
GUIDEPIN VERSANAIL DSP 3.2X444 (ORTHOPEDIC DISPOSABLE SUPPLIES) IMPLANT
HFN 125 DEG 9MM X 180MM (Nail) IMPLANT
HOOD PEEL AWAY T7 (MISCELLANEOUS) ×1 IMPLANT
KIT BASIN OR (CUSTOM PROCEDURE TRAY) ×1 IMPLANT
KIT TURNOVER KIT A (KITS) ×1 IMPLANT
MANIFOLD NEPTUNE II (INSTRUMENTS) ×1 IMPLANT
MARKER SKIN DUAL TIP RULER LAB (MISCELLANEOUS) ×1 IMPLANT
PACK GENERAL/GYN (CUSTOM PROCEDURE TRAY) ×1 IMPLANT
SCREW BONE CORTICAL 5.0X36 (Screw) IMPLANT
SCREW LAG HIP NAIL 10.5X95 (Screw) IMPLANT
SUT MNCRL AB 3-0 PS2 18 (SUTURE) ×1 IMPLANT
SUT MON AB 2-0 CT1 36 (SUTURE) ×1 IMPLANT
SUT VIC AB 1 CT1 36 (SUTURE) ×1 IMPLANT
TOWEL GREEN STERILE FF (TOWEL DISPOSABLE) ×1 IMPLANT
TOWEL OR 17X26 10 PK STRL BLUE (TOWEL DISPOSABLE) ×1 IMPLANT

## 2024-07-27 NOTE — Transfer of Care (Signed)
 Immediate Anesthesia Transfer of Care Note  Patient: Tami Shea  Procedure(s) Performed: FIXATION, FRACTURE, INTERTROCHANTERIC, WITH INTRAMEDULLARY ROD (Right)  Patient Location: PACU  Anesthesia Type:General  Level of Consciousness: awake and confused  Airway & Oxygen Therapy: Patient Spontanous Breathing and Patient connected to face mask oxygen  Post-op Assessment: Report given to RN and Post -op Vital signs reviewed and stable  Post vital signs: Reviewed and stable  Last Vitals:  Vitals Value Taken Time  BP 144/87 07/27/24 13:24  Temp    Pulse 71 07/27/24 13:26  Resp 20 07/27/24 13:30  SpO2 97 % 07/27/24 13:26  Vitals shown include unfiled device data.  Last Pain:  Vitals:   07/27/24 1021  TempSrc:   PainSc: 0-No pain      Patients Stated Pain Goal: 0 (07/27/24 0901)  Complications: No notable events documented.

## 2024-07-27 NOTE — Progress Notes (Signed)
 PROGRESS NOTE    Tami Shea  FMW:[id number] DOB: 22-Feb-1946 DOA: 07/26/2024 PCP: [name], MD   Brief Narrative:  Tami Shea  is a 78 y.o. female, with medical history of stage IV non-small cell lung cancer, adenocarcinoma, who is currently undergoing palliative systemic chemo immunotherapy at cancer center and Patient is on treatment with Keytruda  every 3 weeks.  Patient went home after treatment from cancer center and then when she stepped out of her car she fell on her right hip and was unable to walk.  Was found to have a right intertrochanteric femur fracture and brought in and orthopedics was consulted and took the patient for intramedullary fixation of her right femur and she is POD 0.  Assessment and Plan:  Right femur fracture: X-ray shows osteopenia, nondisplaced intertrochanteric fracture of right hip.  DVT prophylaxis as per orthopedics.  ED provider spoke to orthopedic surgeon, plan for surgery in AM.  Continue with current pain control with p.o. acetaminophen , hydrocodone  1-2 tabs p.o. every 6 as needed, methocarbamol , IV morphine  0.5 mg IV Q2 as needed for severe pain and surgical prophylaxis with cefazolin .  Initiate bowel regimen and will need PT OT to further evaluate and treat   Hypokalemia: Potassium is low in the setting of diarrhea.  K+ Trend:  Recent Labs  Lab 07/04/24 0836 07/26/24 1017 07/26/24 1727 07/27/24 0341  K 3.3* 2.8* 2.6* 2.9*  -Mag was 1.8. Continue aggressive resuscitation and received p.o. KCl 40 mEQ x 1, IV potassium chloride  40 mEQ x1.   Recent Cataract Surgery: Continue ophthalmologic eyedrops with prednisolone  acetate 1 drop in the left eye 3 times daily, gatifloxacin  1 drop in the left eye 3 times daily and Ketorolac  1 drop BID in Left Eye   Non-small cell lung cancer-followed by oncology as outpatient.  Received cycle #7 of Keytruda  yesterday.   Diarrhea: Likely in setting of chemotherapy.  Continue to monitor.  Start gentle hydration with  LR at 75 mL/h for 12 hours.  Continue supportive care and Imodium for diarrhea management  CKD Stage IV: Baseline creatinine levels around 2 BUN/Cr Trend: Recent Labs  Lab 07/04/24 0836 07/26/24 1017 07/26/24 1727 07/27/24 0012 07/27/24 0341  BUN 28* 26* 22  --  22  CREATININE 2.32* 1.99* 1.89* 1.65* 1.62*  -Getting IV fluid hydration as above -Avoid Nephrotoxic Medications, Contrast Dyes, Hypotension and Dehydration to Ensure Adequate Renal Perfusion and will need to Renally Adjust Meds. CTM and Trend Renal Function carefully and repeat CMP in the AM; she is awaiting a referral to nephrology for further evaluation management  Normocytic Anemia/Anemia of Chronic Disease: Hgb/Hct Trend:  Recent Labs  Lab 07/04/24 0836 07/12/24 0954 07/19/24 0953 07/26/24 1017 07/26/24 1727 07/27/24 0012 07/27/24 0341  HGB 9.5* 9.2* 9.3* 9.4* 9.0* 8.3* 8.4*  HCT 29.3* 28.9* 29.2* 28.8* 29.0* 27.2* 27.1*  MCV 88.5 88.4 87.2 85.7 90.1 89.2 89.1  -S/p 2 units of pRBCs today per Orthopedic Sugery. CTM for S/Sx of Bleeding; No overt bleeding noted. Repeat CBC in the AM  Thrombocytopenia: Mild. Plt Count Trend:  Recent Labs  Lab 07/04/24 0836 07/12/24 0954 07/19/24 0953 07/26/24 1017 07/26/24 1727 07/27/24 0012 07/27/24 0341  PLT 165 172 198 176 158 159 145*  -CTM for S/Sx of Bleeding; No overt bleeding noted. Repeat CBC in the AM   DVT prophylaxis: enoxaparin  (LOVENOX ) injection 30 mg Start: 07/28/24 0800 SCDs Start: 07/27/24 1513    Code Status: Full Code Family Communication: No family currently at bedside  Disposition Plan:  Level of care: Telemetry Status is: Inpatient Remains inpatient appropriate because: Further clinical improvement and clearance per specialist and evaluation by PT and OT   Consultants:  Orthopedic Surgery  Procedures:  PROCEDURE: Intramedullary fixation, Right femur.   Antimicrobials:  Anti-infectives (From admission, onward)    Start     Dose/Rate  Route Frequency Ordered Stop   07/27/24 1800  ceFAZolin  (ANCEF ) IVPB 2g/100 mL premix        2 g 200 mL/hr over 30 Minutes Intravenous Every 6 hours 07/27/24 1512 07/28/24 0559   07/27/24 0600  ceFAZolin  (ANCEF ) IVPB 2g/100 mL premix        2 g 200 mL/hr over 30 Minutes Intravenous On call to O.R. 07/26/24 2249 07/27/24 1200       Subjective: Seen and examined in the PACU and she is little confused and has some pain.  Wanting to use the bathroom.  Denied any nausea or vomiting.  No other concerns or complaints at this time.  Objective: Vitals:   07/27/24 1416 07/27/24 1430 07/27/24 1438 07/27/24 1730  BP: (!) 144/99 (!) 143/97 (!) 144/99 (!) 156/94  Pulse: 88 98 88 89  Resp: 20 17 20 20   Temp: 98 F (36.7 C)  98 F (36.7 C) 97.7 F (36.5 C)  TempSrc:   Oral Oral  SpO2:  90% 92% 97%  Weight:      Height:        Intake/Output Summary (Last 24 hours) at 07/27/2024 1916 Last data filed at 07/27/2024 1817 Gross per 24 hour  Intake 3438.35 ml  Output 1600 ml  Net 1838.35 ml   Filed Weights   07/27/24 1040  Weight: 64.3 kg   Examination: Physical Exam:  Constitutional: WN/WD, elderly chronically ill-appearing African-American female who appears uncomfortable Respiratory: Diminished to auscultation bilaterally, no wheezing, rales, rhonchi or crackles. Normal respiratory effort and patient is not tachypenic. No accessory muscle use.  Unlabored breathing Cardiovascular: RRR, no murmurs / rubs / gallops. S1 and S2 auscultated. No extremity edema. Abdomen: Soft, non-tender, non-distended. Bowel sounds positive.  GU: Deferred. Musculoskeletal: No clubbing / cyanosis of digits/nails. No joint deformity upper and lower extremities.  Skin: No rashes, lesions, ulcers on limited skin evaluation. No induration; Warm and dry.  Neurologic: CN 2-12 grossly intact with no focal deficits. Romberg sign and cerebellar reflexes not assessed.  Psychiatric: Normal judgment and insight. Alert and  oriented x 3. Normal mood and appropriate affect.   Data Reviewed: I have personally reviewed following labs and imaging studies  CBC: Recent Labs  Lab 07/26/24 1017 07/26/24 1727 07/27/24 0012 07/27/24 0341  WBC 6.0 7.6 7.7 7.5  NEUTROABS 3.1 5.7  --   --   HGB 9.4* 9.0* 8.3* 8.4*  HCT 28.8* 29.0* 27.2* 27.1*  MCV 85.7 90.1 89.2 89.1  PLT 176 158 159 145*   Basic Metabolic Panel: Recent Labs  Lab 07/26/24 1017 07/26/24 1727 07/27/24 0012 07/27/24 0341  NA 143 142  --  143  K 2.8* 2.6*  --  2.9*  CL 107 105  --  107  CO2 28 23  --  22  GLUCOSE 91 99  --  104*  BUN 26* 22  --  22  CREATININE 1.99* 1.89* 1.65* 1.62*  CALCIUM 9.2 9.3  --  9.1  MG  --   --  1.8  --   PHOS  --   --   --  2.5   GFR: Estimated Creatinine Clearance: 24.7 mL/min (  A) (by C-G formula based on SCr of 1.62 mg/dL (H)). Liver Function Tests: Recent Labs  Lab 07/26/24 1017  AST 23  ALT 8  ALKPHOS 55  BILITOT 0.5  PROT 8.5*  ALBUMIN 3.6   No results for input(s): LIPASE, AMYLASE in the last 168 hours. No results for input(s): AMMONIA in the last 168 hours. Coagulation Profile: Recent Labs  Lab 07/26/24 1759  INR 1.1   Cardiac Enzymes: No results for input(s): CKTOTAL, CKMB, CKMBINDEX, TROPONINI in the last 168 hours. BNP (last 3 results) No results for input(s): PROBNP in the last 8760 hours. HbA1C: No results for input(s): HGBA1C in the last 72 hours. CBG: No results for input(s): GLUCAP in the last 168 hours. Lipid Profile: No results for input(s): CHOL, HDL, LDLCALC, TRIG, CHOLHDL, LDLDIRECT in the last 72 hours. Thyroid  Function Tests: No results for input(s): TSH, T4TOTAL, FREET4, T3FREE, THYROIDAB in the last 72 hours. Anemia Panel: No results for input(s): VITAMINB12, FOLATE, FERRITIN, TIBC, IRON, RETICCTPCT in the last 72 hours. Sepsis Labs: No results for input(s): PROCALCITON, LATICACIDVEN in the last 168  hours.  Recent Results (from the past 240 hours)  Surgical pcr screen     Status: None   Collection Time: 07/27/24  1:26 AM   Specimen: Nasal Mucosa; Nasal Swab  Result Value Ref Range Status   MRSA, PCR NEGATIVE NEGATIVE Final   Staphylococcus aureus NEGATIVE NEGATIVE Final    Comment: (NOTE) The Xpert SA Assay (FDA approved for NASAL specimens in patients 81 years of age and older), is one component of a comprehensive surveillance program. It is not intended to diagnose infection nor to guide or monitor treatment. Performed at [facility], [address], [place], [place] [address]     Radiology Studies: DG HIP UNILAT WITH PELVIS 1V RIGHT Result Date: 07/27/2024 EXAM: 1 VIEW XRAY OF THE RIGHT HIP 07/27/2024 01:04:00 PM COMPARISON: Radiographs 07/26/2024 CLINICAL HISTORY: 886218 Surgery, elective 886218. Right Im nail; FINDINGS: Two fluoroscopic spot views of the right hip submitted from the operating room. Femoral intramedullary nail with transtrochanteric and distal locking screw fixation traverses proximal femur fracture. FT: 49 sec FD: 5.8463 mGy IMPRESSION: 1. Procedure fluoroscopy during proximal femur fracture fixation. Electronically signed by: Andrea Gasman MD 07/27/2024 03:07 PM EDT RP Workstation: [id number]   DG C-Arm 1-60 Min-No Report Result Date: 07/27/2024 Fluoroscopy was utilized by the requesting physician.  No radiographic interpretation.   DG C-Arm 1-60 Min-No Report Result Date: 07/27/2024 Fluoroscopy was utilized by the requesting physician.  No radiographic interpretation.   DG Knee Right Port Result Date: 07/26/2024 CLINICAL DATA:  Fall and trauma to the right knee. EXAM: PORTABLE RIGHT KNEE - 1-2 VIEW COMPARISON:  None Available. FINDINGS: There is no acute fracture or dislocation. The bones are osteopenic. Moderate arthritic changes of the knee. No significant effusion. The soft tissues are unremarkable. IMPRESSION: 1. No acute fracture or  dislocation. 2. Moderate arthritic changes. Electronically Signed   By: Vanetta Chou M.D.   On: 07/26/2024 18:53   DG Hip Unilat W or Wo Pelvis 2-3 Views Right Result Date: 07/26/2024 CLINICAL DATA:  fall EXAM: DG HIP (WITH OR WITHOUT PELVIS) 2-3V RIGHT COMPARISON:  June 28, 2024 FINDINGS: Osteopenia.No evidence of pelvic fracture or diastasis.Nondisplaced intertrochanteric fracture of the right hip. No dislocation.Multilevel degenerative disc disease of the spine. Mild osteoarthritis of both hips. Peripheral vascular atherosclerosis. IMPRESSION: Osteopenia. Nondisplaced, intertrochanteric fracture of the right hip. Electronically Signed   By: Rogelia Carlean HERO.D.  On: 07/26/2024 17:24   DG Chest 1 View Result Date: 07/26/2024 CLINICAL DATA:  fall EXAM: CHEST  1 VIEW COMPARISON:  June 28, 2024 FINDINGS: Lower lung volumes. Bilateral perihilar interstitial opacities. Moderate cardiomegaly with superimposed large pericardial effusion again noted. Right chest port in place terminating at the cavoatrial junction. Tortuous aorta with aortic atherosclerosis. No acute fracture or destructive lesions. Multilevel thoracic osteophytosis. IMPRESSION: 1. Bilateral perihilar interstitial opacities,, which may represent bronchovascular crowding due to low lung volumes, interstitial edema, or atypical/viral infection, in the correct clinical context. 2. Moderate cardiomegaly and superimposed pericardial effusion, unchanged. Electronically Signed   By: Rogelia Myers M.D.   On: 07/26/2024 17:22   Scheduled Meds:  sodium chloride    Intravenous Once   amLODipine   10 mg Oral Daily   docusate sodium   100 mg Oral BID   [START ON 07/28/2024] enoxaparin  (LOVENOX ) injection  30 mg Subcutaneous Q24H   feeding supplement  237 mL Oral BID BM   gatifloxacin   1 drop Left Eye TID   ketorolac   1 drop Left Eye BID   prednisoLONE  acetate  1 drop Left Eye TID   senna  1 tablet Oral BID   Continuous Infusions:  sodium  chloride      ceFAZolin  (ANCEF ) IV Stopped (07/27/24 1705)   lactated ringers  75 mL/hr at 07/27/24 1708    LOS: 1 [place]   Alejandro Marker, DO [facility] Available via Epic secure chat 7am-7pm After these hours, please refer to coverage provider listed on [email or web address] 07/27/2024, 7:16 PM

## 2024-07-27 NOTE — Progress Notes (Signed)
 IV team to bedside for DBIV- Pt currently receiving a blood transfusion, with approximately 1 hour remaining. Will get labs 2 hours post transfusion.

## 2024-07-27 NOTE — Anesthesia Postprocedure Evaluation (Signed)
 Anesthesia Post Note  Patient: Tami Shea  Procedure(s) Performed: FIXATION, FRACTURE, INTERTROCHANTERIC, WITH INTRAMEDULLARY ROD (Right)     Patient location during evaluation: PACU Anesthesia Type: General Level of consciousness: sedated and patient cooperative Pain management: pain level controlled Vital Signs Assessment: post-procedure vital signs reviewed and stable Respiratory status: spontaneous breathing Cardiovascular status: stable Anesthetic complications: no   No notable events documented.  Last Vitals:  Vitals:   07/27/24 1430 07/27/24 1438  BP: (!) 143/97 (!) 144/99  Pulse: 98 88  Resp: 17 20  Temp:  36.7 C  SpO2: 90% 92%    Last Pain:  Vitals:   07/27/24 1438  TempSrc: Oral  PainSc:                  [name]

## 2024-07-27 NOTE — Interval H&P Note (Signed)
 History and Physical Interval Note:  07/27/2024 11:35 AM  Tami Shea  has presented today for surgery, with the diagnosis of HIP FRACTURE.  The various methods of treatment have been discussed with the patient and family. After consideration of risks, benefits and other options for treatment, the patient has consented to  Procedure(s): FIXATION, FRACTURE, INTERTROCHANTERIC, WITH INTRAMEDULLARY ROD (Right) as a surgical intervention.  The patient's history has been reviewed, patient examined, no change in status, stable for surgery.  I have reviewed the patient's chart and labs.  Questions were answered to the patient's satisfaction.    The risks, benefits, and alternatives were discussed with the patient. There are risks associated with the surgery including, but not limited to, problems with anesthesia (death), infection, differences in leg length/angulation/rotation, fracture of bones, loosening or failure of implants, malunion, nonunion, hematoma (blood accumulation) which may require surgical drainage, blood clots, pulmonary embolism, nerve injury (foot drop), and blood vessel injury. The patient understands these risks and elects to proceed.  Patient/family understand increased risk of infection / nonunion / failure due to Lung CA and Keytruda .   Redell PARAS Robbi Scurlock

## 2024-07-27 NOTE — Consult Note (Signed)
 ORTHOPAEDIC CONSULTATION  REQUESTING PHYSICIAN: Sherrill Alejandro Donovan, DO  PCP:  [name], MD  Chief Complaint: right hip pain  HPI: Tami Shea is a 78 y.o. female who history of stage IV non-small cell lung cancer, CKD here presenting with fall.  Patient just finished her infusion yesterday.  She was walking and then tripped and fell and landed on her right hip.  Patient states that afterwards, she was unable to walk.  Denies any head injury or other injuries.  Patient is not currently on blood thinners. X-rays showed right IT femur fracture. Orthopedics consulted for management. She denies any other injuries. Denies tingling or numbness in LE bilaterally.   Hgb 8.4.    Past Medical History:  Diagnosis Date   Dyspnea    History of kidney stones    HTN (hypertension)    Smoker    Past Surgical History:  Procedure Laterality Date   BRONCHIAL NEEDLE ASPIRATION BIOPSY  10/24/2023   Procedure: BRONCHIAL NEEDLE ASPIRATION BIOPSIES;  Surgeon: Brenna Adine CROME, DO;  Location: [facility];  Service: Pulmonary;;   COLONOSCOPY     IR IMAGING GUIDED PORT INSERTION  12/09/2023   PILONIDAL CYST EXCISION     tonisllectomy     VIDEO BRONCHOSCOPY WITH ENDOBRONCHIAL ULTRASOUND Right 10/24/2023   Procedure: VIDEO BRONCHOSCOPY WITH ENDOBRONCHIAL ULTRASOUND;  Surgeon: Brenna Adine CROME, DO;  Location: [facility];  Service: Pulmonary;  Laterality: Right;   Social History   Socioeconomic History   Marital status: Divorced    Spouse name: Not on file   Number of children: Not on file   Years of education: Not on file   Highest education level: Not on file  Occupational History   Not on file  Tobacco Use   Smoking status: Former    Current packs/[place]: 0.00    Types: Cigarettes    Quit date: 09/24/2023    Years since quitting: 0.8   Smokeless tobacco: Never  Substance and Sexual Activity   Alcohol  use: Not Currently   Drug use: Not Currently    Types: Marijuana   Sexual  activity: Not on file  Other Topics Concern   Not on file  Social History Narrative   Not on file   Social Drivers of Health   Financial Resource Strain: Not on file  Food Insecurity: No Food Insecurity (07/26/2024)   Hunger Vital Sign    Worried About Running Out of Food in the Last Year: Never true    Ran Out of Food in the Last Year: Never true  Transportation Needs: No Transportation Needs (07/26/2024)   PRAPARE - Administrator, Civil Service (Medical): No    Lack of Transportation (Non-Medical): No  Physical Activity: Not on file  Stress: Not on file  Social Connections: Moderately Integrated (07/26/2024)   Social Connection and Isolation Panel    Frequency of Communication with Friends and Family: Three times a week    Frequency of Social Gatherings with Friends and Family: More than three times a week    Attends Religious Services: 1 to 4 times per year    Active Member of [employer] or Organizations: Yes    Attends Banker Meetings: 1 to 4 times per year    Marital Status: Divorced   Family History  Problem Relation Age of Onset   Kidney disease Mother    Heart disease Father    No Known Allergies Prior to Admission medications   Medication Sig Start Date  End Date Taking? Authorizing Provider  amLODipine  (NORVASC ) 10 MG tablet 1 tablet   Yes [provider]  Cholecalciferol (VITAMIN D3) 1.25 MG (50000 UT) CAPS Take 1 capsule by mouth once a week. 10/03/23  Yes [provider]  gatifloxacin  (ZYMAXID ) 0.5 % SOLN Place 1 drop into the left eye 3 (three) times daily. 07/02/24  Yes [provider]  ketorolac  (ACULAR ) 0.5 % ophthalmic solution Place 1 drop into the left eye 2 (two) times daily. 07/02/24  Yes [provider]  lidocaine -prilocaine  (EMLA ) cream Apply to affected area once Patient taking differently: Apply 1 Application topically as needed [facility] access). Apply to affected area once 12/01/23  Yes Sherrod Sherrod, MD  loperamide (IMODIUM) 2 MG capsule Take 2 mg by mouth as needed for diarrhea or loose stools (pt has been taking daily).   Yes [provider]  prednisoLONE  acetate (PRED FORTE ) 1 % ophthalmic suspension Place 1 drop into the left eye 3 (three) times daily. 07/02/24  Yes [provider]  prochlorperazine  (COMPAZINE ) 10 MG tablet Take 1 tablet (10 mg total) by mouth every 6 (six) hours as needed for nausea or vomiting. 12/01/23  Yes Sherrod Sherrod, MD  cephALEXin  (KEFLEX ) 250 MG capsule Take 1 capsule (250 mg total) by mouth 3 (three) times daily. Patient not taking: Reported on 07/27/2024 04/11/24   Heilingoetter, Cassandra L, PA-C  folic acid  (FOLVITE ) 1 MG tablet Take 1 tablet (1 mg total) by mouth daily. Start 7 days before pemetrexed  chemotherapy. Continue until 21 days after pemetrexed  completed. Patient not taking: Reported on 07/27/2024 12/01/23   Sherrod Sherrod, MD  LORazepam  (ATIVAN ) 0.5 MG tablet Take 1 tablet by mouth 30-60 minutes before your scan Patient not taking: Reported on 07/27/2024 02/08/24   Heilingoetter, Cassandra L, PA-C  ondansetron  (ZOFRAN ) 8 MG tablet Take 1 tablet (8 mg total) by mouth every 8 (eight) hours as needed for nausea or vomiting. Start on the third [place] after carboplatin . Patient not taking: Reported on 03/08/2024 12/01/23   Sherrod Sherrod, MD  potassium chloride  SA (KLOR-CON  M) 20 MEQ tablet Take 1 tablet (20 mEq total) by mouth 2 (two) times daily. 07/26/24   Sherrod Sherrod, MD  RESTASIS  0.05 % ophthalmic emulsion Place 1 drop into both eyes 2 (two) times daily. Patient not taking: Reported on 07/27/2024 01/02/24   [provider]   DG Knee Right Port Result Date: 07/26/2024 CLINICAL DATA:  Fall and trauma to the right knee. EXAM: PORTABLE RIGHT KNEE - 1-2 VIEW COMPARISON:  None Available. FINDINGS: There is no acute fracture or dislocation. The bones are osteopenic. Moderate arthritic changes of the knee. No significant  effusion. The soft tissues are unremarkable. IMPRESSION: 1. No acute fracture or dislocation. 2. Moderate arthritic changes. Electronically Signed   By: Vanetta Chou M.D.   On: 07/26/2024 18:53   DG Hip Unilat W or Wo Pelvis 2-3 Views Right Result Date: 07/26/2024 CLINICAL DATA:  fall EXAM: DG HIP (WITH OR WITHOUT PELVIS) 2-3V RIGHT COMPARISON:  June 28, 2024 FINDINGS: Osteopenia.No evidence of pelvic fracture or diastasis.Nondisplaced intertrochanteric fracture of the right hip. No dislocation.Multilevel degenerative disc disease of the spine. Mild osteoarthritis of both hips. Peripheral vascular atherosclerosis. IMPRESSION: Osteopenia. Nondisplaced, intertrochanteric fracture of the right hip. Electronically Signed   By: Rogelia Myers M.D.   On: 07/26/2024 17:24   DG Chest 1 View Result Date: 07/26/2024 CLINICAL DATA:  fall EXAM: CHEST  1 VIEW COMPARISON:  June 28, 2024 FINDINGS: Lower lung volumes.  Bilateral perihilar interstitial opacities. Moderate cardiomegaly with superimposed large pericardial effusion again noted. Right chest port in place terminating at the cavoatrial junction. Tortuous aorta with aortic atherosclerosis. No acute fracture or destructive lesions. Multilevel thoracic osteophytosis. IMPRESSION: 1. Bilateral perihilar interstitial opacities,, which may represent bronchovascular crowding due to low lung volumes, interstitial edema, or atypical/viral infection, in the correct clinical context. 2. Moderate cardiomegaly and superimposed pericardial effusion, unchanged. Electronically Signed   By: Rogelia Myers M.D.   On: 07/26/2024 17:22    Positive ROS: All other systems have been reviewed and were otherwise negative with the exception of those mentioned in the HPI and as above.  Physical Exam: General: Alert, oriented to self and place, confused this morning.  Cardiovascular: No pedal edema Respiratory: No cyanosis, no use of accessory musculature GI: No organomegaly,  abdomen is soft and non-tender Skin: No lesions in the area of chief complaint Neurologic: Sensation intact distally Psychiatric: Patient confused today.  Lymphatic: No axillary or cervical lymphadenopathy  MUSCULOSKELETAL:  Examination of the right hip reveals no skin wounds or lesions. No shortening noted her hip ER. Pain with hip movement. Trochanteric tenderness to palpation.  Sensory and motor function intact in LE bilaterally. Distal pedal pulses 2+ bilaterally.  Capillary refill < 2 seconds.   Assessment: Closed right IT femur fracture.  Anemia, hemoglobin 8.4.  Plan: I discussed the findings with the patient. I called her daughter as patient is confused this morning. She has an unstable right intertrochanteric femur fracture that will require surgical treatment to allow mobilization immediately and pain control. Patient TRH admit, appreciate their consult. Plan for right hip IM fixation today. Continue to hold chemical DVT ppx. Continue to remain NPO. All questions solicited and answered.      Valery GORMAN Potters, PA-C    07/27/2024 8:38 AM

## 2024-07-27 NOTE — H&P (View-Only) (Signed)
 ORTHOPAEDIC CONSULTATION  REQUESTING PHYSICIAN: Sherrill Alejandro Donovan, DO  PCP:  [name], MD  Chief Complaint: right hip pain  HPI: Tami Shea is a 78 y.o. female who history of stage IV non-small cell lung cancer, CKD here presenting with fall.  Patient just finished her infusion yesterday.  She was walking and then tripped and fell and landed on her right hip.  Patient states that afterwards, she was unable to walk.  Denies any head injury or other injuries.  Patient is not currently on blood thinners. X-rays showed right IT femur fracture. Orthopedics consulted for management. She denies any other injuries. Denies tingling or numbness in LE bilaterally.   Hgb 8.4.    Past Medical History:  Diagnosis Date   Dyspnea    History of kidney stones    HTN (hypertension)    Smoker    Past Surgical History:  Procedure Laterality Date   BRONCHIAL NEEDLE ASPIRATION BIOPSY  10/24/2023   Procedure: BRONCHIAL NEEDLE ASPIRATION BIOPSIES;  Surgeon: Brenna Adine CROME, DO;  Location: [facility];  Service: Pulmonary;;   COLONOSCOPY     IR IMAGING GUIDED PORT INSERTION  12/09/2023   PILONIDAL CYST EXCISION     tonisllectomy     VIDEO BRONCHOSCOPY WITH ENDOBRONCHIAL ULTRASOUND Right 10/24/2023   Procedure: VIDEO BRONCHOSCOPY WITH ENDOBRONCHIAL ULTRASOUND;  Surgeon: Brenna Adine CROME, DO;  Location: [facility];  Service: Pulmonary;  Laterality: Right;   Social History   Socioeconomic History   Marital status: Divorced    Spouse name: Not on file   Number of children: Not on file   Years of education: Not on file   Highest education level: Not on file  Occupational History   Not on file  Tobacco Use   Smoking status: Former    Current packs/[place]: 0.00    Types: Cigarettes    Quit date: 09/24/2023    Years since quitting: 0.8   Smokeless tobacco: Never  Substance and Sexual Activity   Alcohol  use: Not Currently   Drug use: Not Currently    Types: Marijuana   Sexual  activity: Not on file  Other Topics Concern   Not on file  Social History Narrative   Not on file   Social Drivers of Health   Financial Resource Strain: Not on file  Food Insecurity: No Food Insecurity (07/26/2024)   Hunger Vital Sign    Worried About Running Out of Food in the Last Year: Never true    Ran Out of Food in the Last Year: Never true  Transportation Needs: No Transportation Needs (07/26/2024)   PRAPARE - Administrator, Civil Service (Medical): No    Lack of Transportation (Non-Medical): No  Physical Activity: Not on file  Stress: Not on file  Social Connections: Moderately Integrated (07/26/2024)   Social Connection and Isolation Panel    Frequency of Communication with Friends and Family: Three times a week    Frequency of Social Gatherings with Friends and Family: More than three times a week    Attends Religious Services: 1 to 4 times per year    Active Member of [employer] or Organizations: Yes    Attends Banker Meetings: 1 to 4 times per year    Marital Status: Divorced   Family History  Problem Relation Age of Onset   Kidney disease Mother    Heart disease Father    No Known Allergies Prior to Admission medications   Medication Sig Start Date  End Date Taking? Authorizing Provider  amLODipine  (NORVASC ) 10 MG tablet 1 tablet   Yes [provider]  Cholecalciferol (VITAMIN D3) 1.25 MG (50000 UT) CAPS Take 1 capsule by mouth once a week. 10/03/23  Yes [provider]  gatifloxacin  (ZYMAXID ) 0.5 % SOLN Place 1 drop into the left eye 3 (three) times daily. 07/02/24  Yes [provider]  ketorolac  (ACULAR ) 0.5 % ophthalmic solution Place 1 drop into the left eye 2 (two) times daily. 07/02/24  Yes [provider]  lidocaine -prilocaine  (EMLA ) cream Apply to affected area once Patient taking differently: Apply 1 Application topically as needed [facility] access). Apply to affected area once 12/01/23  Yes Sherrod Sherrod, MD  loperamide (IMODIUM) 2 MG capsule Take 2 mg by mouth as needed for diarrhea or loose stools (pt has been taking daily).   Yes [provider]  prednisoLONE  acetate (PRED FORTE ) 1 % ophthalmic suspension Place 1 drop into the left eye 3 (three) times daily. 07/02/24  Yes [provider]  prochlorperazine  (COMPAZINE ) 10 MG tablet Take 1 tablet (10 mg total) by mouth every 6 (six) hours as needed for nausea or vomiting. 12/01/23  Yes Sherrod Sherrod, MD  cephALEXin  (KEFLEX ) 250 MG capsule Take 1 capsule (250 mg total) by mouth 3 (three) times daily. Patient not taking: Reported on 07/27/2024 04/11/24   Heilingoetter, Cassandra L, PA-C  folic acid  (FOLVITE ) 1 MG tablet Take 1 tablet (1 mg total) by mouth daily. Start 7 days before pemetrexed  chemotherapy. Continue until 21 days after pemetrexed  completed. Patient not taking: Reported on 07/27/2024 12/01/23   Sherrod Sherrod, MD  LORazepam  (ATIVAN ) 0.5 MG tablet Take 1 tablet by mouth 30-60 minutes before your scan Patient not taking: Reported on 07/27/2024 02/08/24   Heilingoetter, Cassandra L, PA-C  ondansetron  (ZOFRAN ) 8 MG tablet Take 1 tablet (8 mg total) by mouth every 8 (eight) hours as needed for nausea or vomiting. Start on the third [place] after carboplatin . Patient not taking: Reported on 03/08/2024 12/01/23   Sherrod Sherrod, MD  potassium chloride  SA (KLOR-CON  M) 20 MEQ tablet Take 1 tablet (20 mEq total) by mouth 2 (two) times daily. 07/26/24   Sherrod Sherrod, MD  RESTASIS  0.05 % ophthalmic emulsion Place 1 drop into both eyes 2 (two) times daily. Patient not taking: Reported on 07/27/2024 01/02/24   [provider]   DG Knee Right Port Result Date: 07/26/2024 CLINICAL DATA:  Fall and trauma to the right knee. EXAM: PORTABLE RIGHT KNEE - 1-2 VIEW COMPARISON:  None Available. FINDINGS: There is no acute fracture or dislocation. The bones are osteopenic. Moderate arthritic changes of the knee. No significant  effusion. The soft tissues are unremarkable. IMPRESSION: 1. No acute fracture or dislocation. 2. Moderate arthritic changes. Electronically Signed   By: Vanetta Chou M.D.   On: 07/26/2024 18:53   DG Hip Unilat W or Wo Pelvis 2-3 Views Right Result Date: 07/26/2024 CLINICAL DATA:  fall EXAM: DG HIP (WITH OR WITHOUT PELVIS) 2-3V RIGHT COMPARISON:  June 28, 2024 FINDINGS: Osteopenia.No evidence of pelvic fracture or diastasis.Nondisplaced intertrochanteric fracture of the right hip. No dislocation.Multilevel degenerative disc disease of the spine. Mild osteoarthritis of both hips. Peripheral vascular atherosclerosis. IMPRESSION: Osteopenia. Nondisplaced, intertrochanteric fracture of the right hip. Electronically Signed   By: Rogelia Myers M.D.   On: 07/26/2024 17:24   DG Chest 1 View Result Date: 07/26/2024 CLINICAL DATA:  fall EXAM: CHEST  1 VIEW COMPARISON:  June 28, 2024 FINDINGS: Lower lung volumes.  Bilateral perihilar interstitial opacities. Moderate cardiomegaly with superimposed large pericardial effusion again noted. Right chest port in place terminating at the cavoatrial junction. Tortuous aorta with aortic atherosclerosis. No acute fracture or destructive lesions. Multilevel thoracic osteophytosis. IMPRESSION: 1. Bilateral perihilar interstitial opacities,, which may represent bronchovascular crowding due to low lung volumes, interstitial edema, or atypical/viral infection, in the correct clinical context. 2. Moderate cardiomegaly and superimposed pericardial effusion, unchanged. Electronically Signed   By: Rogelia Myers M.D.   On: 07/26/2024 17:22    Positive ROS: All other systems have been reviewed and were otherwise negative with the exception of those mentioned in the HPI and as above.  Physical Exam: General: Alert, oriented to self and place, confused this morning.  Cardiovascular: No pedal edema Respiratory: No cyanosis, no use of accessory musculature GI: No organomegaly,  abdomen is soft and non-tender Skin: No lesions in the area of chief complaint Neurologic: Sensation intact distally Psychiatric: Patient confused today.  Lymphatic: No axillary or cervical lymphadenopathy  MUSCULOSKELETAL:  Examination of the right hip reveals no skin wounds or lesions. No shortening noted her hip ER. Pain with hip movement. Trochanteric tenderness to palpation.  Sensory and motor function intact in LE bilaterally. Distal pedal pulses 2+ bilaterally.  Capillary refill < 2 seconds.   Assessment: Closed right IT femur fracture.  Anemia, hemoglobin 8.4.  Plan: I discussed the findings with the patient. I called her daughter as patient is confused this morning. She has an unstable right intertrochanteric femur fracture that will require surgical treatment to allow mobilization immediately and pain control. Patient TRH admit, appreciate their consult. Plan for right hip IM fixation today. Continue to hold chemical DVT ppx. Continue to remain NPO. All questions solicited and answered.      Valery GORMAN Potters, PA-C    07/27/2024 8:38 AM

## 2024-07-27 NOTE — OR Nursing (Signed)
 During initial assessment, noticed that there was a blood products refusal documented under the pre procedural timeout, but there was not one in the chart.  Clarified with pt and pt family members at bedside that pt would accept blood products. Communicated with short stay RN and he stated that that the refusal was mistakenly documented so it was corrected in his documentation.

## 2024-07-27 NOTE — Progress Notes (Signed)
 Nutrition Follow-up  DOCUMENTATION CODES:   Not applicable  INTERVENTION:  - Recommend Regular diet once able to advance.  - Ensure Plus High Protein po BID, each supplement provides 350 kcal and 20 grams of protein.   NUTRITION DIAGNOSIS:   Increased nutrient needs related to hip fracture as evidenced by estimated needs.  GOAL:   Patient will meet greater than or equal to 90% of their needs  MONITOR:   PO intake, Supplement acceptance, Weight trends  REASON FOR ASSESSMENT:   Consult Hip fracture protocol  ASSESSMENT:   78 y.o. female with PMH of stage IV non-small cell lung cancer, adenocarcinoma currently undergoing palliative systemic chemo immunotherapy who presented after a fall and found to have right intertrochanteric femur fracture.  Patient in OR at time of visit today.  Per chart review, weight mostly stable until the last 2 months, which patient has lost 9# or 6%.  Patient has been followed by outpatient cancer center dietitians but has not been since since July.   Patient NPO for OR today. Will add ONS to support oral intake.    Medications reviewed and include: -  Labs reviewed:  K+ 2.9 Creatinine 1.62   NUTRITION - FOCUSED PHYSICAL EXAM:  Unable to obtain  Diet Order:   Diet Order             Diet NPO time specified Except for: Sips with Meds  Diet effective now                   EDUCATION NEEDS:  Not appropriate for education at this time  Skin:  Skin Assessment: Reviewed RN Assessment  Last BM:  PTA  Height:  Ht Readings from Last 1 Encounters:  07/27/24 5' 4 (1.626 m)   Weight:  Wt Readings from Last 1 Encounters:  07/27/24 64.3 kg    BMI:  Body mass index is 24.33 kg/m.  Estimated Nutritional Needs:  Kcal:  1800-1950 kcals Protein:  80-95 grams Fluid:  >/= 1.8L    Trude Ned RD, LDN Contact via Secure Chat.

## 2024-07-27 NOTE — Discharge Instructions (Signed)
 Dr. Redell Shoals [facility] Knee Specialist [facility] [address], [address] [place], [place] [address] [phone]   POSTOPERATIVE DIRECTIONS    Hip Rehabilitation, Guidelines Following Surgery   WEIGHT BEARING Weight bearing as tolerated with assist device (walker, cane, etc) as directed, use it as long as suggested by your surgeon or therapist, typically at least 4-6 weeks.   HOME CARE INSTRUCTIONS  Remove items at home which could result in a fall. This includes throw rugs or furniture in walking pathways.  Continue medications as instructed at time of discharge. You may have some home medications which will be placed on hold until you complete the course of blood thinner medication. Do not remove your dressing.  No tub baths or soaking your incisions. You may shower.  Do not put on socks or shoes without following the instructions of your caregivers.   Sit on chairs with arms. Use the chair arms to help push yourself up when arising.  Arrange for the use of a toilet seat elevator so you are not sitting low.  Walk with walker as instructed.  You may resume a sexual relationship in one month or when given the OK by your caregiver.  Use walker as long as suggested by your caregivers.  Avoid periods of inactivity such as sitting longer than an hour when not asleep. This helps prevent blood clots.  You may return to work once you are cleared by Designer, industrial/product.  Do not drive a car for 6 weeks or until released by your surgeon.  Do not drive while taking narcotics.  Wear elastic stockings for two weeks following surgery during the [place] but you may remove then at night.  Make sure you keep all of your appointments after your operation with all of your doctors and caregivers. You should call the office at the above phone number and make an appointment for approximately two weeks after the date of your surgery. Please pick up a stool softener and laxative for home  use as long as you are requiring pain medications. ICE to the affected hip every three hours for 30 minutes at a time and then as needed for pain and swelling. Continue to use ice on the hip for pain and swelling from surgery. You may notice swelling that will progress down to the foot and ankle.  This is normal after surgery.  Elevate the leg when you are not up walking on it.   It is important for you to complete the blood thinner medication as prescribed by your doctor. Continue to use the breathing machine which will help keep your temperature down.  It is common for your temperature to cycle up and down following surgery, especially at night when you are not up moving around and exerting yourself.  The breathing machine keeps your lungs expanded and your temperature down.  RANGE OF MOTION AND STRENGTHENING EXERCISES  These exercises are designed to help you keep full movement of your hip joint. Follow your caregiver's or physical therapist's instructions. Perform all exercises about fifteen times, three times per [place] or as directed. Exercise both hips, even if you have had only one joint replacement. These exercises can be done on a training (exercise) mat, on the floor, on a table or on a bed. Use whatever works the best and is most comfortable for you. Use music or television while you are exercising so that the exercises are a pleasant break in your [place]. This will make your  life better with the exercises acting as a break in routine you can look forward to.  Lying on your back, slowly slide your foot toward your buttocks, raising your knee up off the floor. Then slowly slide your foot back down until your leg is straight again.  Lying on your back spread your legs as far apart as you can without causing discomfort.  Lying on your side, raise your upper leg and foot straight up from the floor as far as is comfortable. Slowly lower the leg and repeat.  Lying on your back, tighten up the muscle in the  front of your thigh (quadriceps muscles). You can do this by keeping your leg straight and trying to raise your heel off the floor. This helps strengthen the largest muscle supporting your knee.  Lying on your back, tighten up the muscles of your buttocks both with the legs straight and with the knee bent at a comfortable angle while keeping your heel on the floor.   SKILLED REHAB INSTRUCTIONS: If the patient is transferred to a skilled rehab facility following release from the hospital, a list of the current medications will be sent to the facility for the patient to continue.  When discharged from the skilled rehab facility, please have the facility set up the patient's Home Health Physical Therapy prior to being released. Also, the skilled facility will be responsible for providing the patient with their medications at time of release from the facility to include their pain medication and their blood thinner medication. If the patient is still at the rehab facility at time of the two week follow up appointment, the skilled rehab facility will also need to assist the patient in arranging follow up appointment in our office and any transportation needs.  MAKE SURE YOU:  Understand these instructions.  Will watch your condition.  Will get help right away if you are not doing well or get worse.  Pick up stool softner and laxative for home use following surgery while on pain medications. Do not remove your dressing.  You may shower with your dressings on, they are waterproof. Do not soak or submerge your incision. No tub baths.  Continue to use ice for pain and swelling after surgery. Do not use any lotions or creams on the incision until instructed by your surgeon.

## 2024-07-27 NOTE — Hospital Course (Addendum)
 Tami Shea  is a 78 y.o. female, with medical history of stage IV non-small cell lung cancer, adenocarcinoma, who is currently undergoing palliative systemic chemo immunotherapy at cancer center and Patient is on treatment with Keytruda  every 3 weeks.  Patient went home after treatment from cancer center and then when she stepped out of her car she fell on her right hip and was unable to walk.  Was found to have a right intertrochanteric femur fracture and brought in and Orthopedics was consulted and took the patient for intramedullary fixation of her right femur on 9/19 and she is POD 2. PT now recommending SNF.  Assessment and Plan:  Right femur fracture: X-ray shows osteopenia, nondisplaced intertrochanteric fracture of right hip.  DVT prophylaxis as per orthopedics and have initiated Enoxaparin  inpatient and recommending ASA as an outpatient. Orthopedic Surgery consulted and patient taken for Surgical intervention 9/19. Per ortho WBAT RLE. Continue with current pain control with p.o. acetaminophen , hydrocodone  1-2 tabs p.o. every 6 as needed, methocarbamol , IV morphine  0.5 mg IV Q2 as needed for severe pain and surgical prophylaxis with Cefazolin . Bowel regimen w/ Senna 1 tab po BID and Docusate 1 tab po BID. Initiated bowel regimen and will need PT/OT recommending SNF and patient will need to follow-up with Dr. Fidel in 2 weeks  Leukocytosis: Mild and likely 2/2 to above. WBC went from 7.5 -> 10.8 -> 11.0 -> 10.4. CTM for S/Sx of Infection. Repeat CBC in the AM   Hypokalemia: Potassium is low in the setting of diarrhea.  K+ went from 3.3 -> 2.8 -> 2.6 -> 2.9 -> 3.7. -> 3.0. Replete w/ po K Phos  20 mEQ BID x2 and po KCL 40 mEQ x1. Mag is now 2.1. CTM and Replete as Necessary. Repeat CMP in the AM   Recent Cataract Surgery: Continue ophthalmologic eyedrops with Prednisolone  Acetate 1 drop in the left eye 3 times daily, and Ketorolac  1 drop BID in Left Eye; No longer taking Gatifloxacin  1 drop in the  left eye 3 times daily   Non-small cell lung cancer-followed by oncology as outpatient.  Received cycle #7 of Keytruda  on 9/18.  Essential Hypertension: Continue with home Amlodipine  10 mg po Daily.  Can to monitor blood pressures per protocol.  Last blood pressure reading was on the softer side at 103/78   Diarrhea: Likely in setting of chemotherapy.  Continue to monitor.  Gentle hydration with LR at 75 mL/hr x1 [place].  Continue supportive care and Imodium for diarrhea management  CKD Stage IV: Baseline creatinine levels around 2 BUN/Cr Trend: Recent Labs  Lab 07/04/24 0836 07/26/24 1017 07/26/24 1727 07/27/24 0012 07/27/24 0341 07/27/24 1939 07/28/24 0354 07/29/24 0350  BUN 28* 26* 22  --  22  --  17 20  CREATININE 2.32* 1.99* 1.89* 1.65* 1.62* 1.43* 1.45* 1.65*  -IVF w/ LR @ 75 mL/hr x1 Girdner now stopped.  -Avoid Nephrotoxic Medications, Contrast Dyes, Hypotension and Dehydration to Ensure Adequate Renal Perfusion and will need to Renally Adjust Meds. CTM and Trend Renal Function carefully and repeat CMP in the AM; she is awaiting a referral to nephrology for further evaluation management  Hypophosphatemia: Mild. Phos Level is now 2.7. CTM and Replete as Necessary. Repeat CMP in the AM  Normocytic Anemia/Anemia of Chronic Disease: S/p 2 units of pRBCs per Orthopedic Sugery on 9/19. Hgb/Hct Trend improved after blood Transfusions and went from 8.3/29.0 -> 8.4/27.1 -> 11.4/35.6 -> 11.2/35.4 -> 11.0/34.2. CTM for S/Sx of Bleeding; No overt bleeding noted.  Repeat CBC in the AM  Thrombocytopenia: Mild. Plt Count Trend Fluctuating and went from 176 -> 158 -> 159 -> 145 -> 156 -> 141 -> 151. CTM for S/Sx of Bleeding; No overt bleeding noted. Repeat CBC in the AM  Hypoalbuminemia: Patient's Albumin Lvl went from 3.5 -> 3.6 -> 3.4 -> 3.2. CTM and Trend and repeat CMP in the AM

## 2024-07-27 NOTE — Anesthesia Procedure Notes (Signed)
 Procedure Name: Intubation Date/Time: 07/27/2024 11:56 AM  Performed by: Metta Andrea NOVAK, CRNAPre-anesthesia Checklist: Patient identified, Emergency Drugs available, Suction available, Patient being monitored and Timeout performed Patient Re-evaluated:Patient Re-evaluated prior to induction Oxygen Delivery Method: Circle system utilized Preoxygenation: Pre-oxygenation with 100% oxygen Induction Type: IV induction Ventilation: Mask ventilation without difficulty Laryngoscope Size: Glidescope and 3 Grade View: Grade I Tube type: Oral Tube size: 7.0 mm Number of attempts: 1 Airway Equipment and Method: Stylet Placement Confirmation: ETT inserted through vocal cords under direct vision, positive ETCO2 and breath sounds checked- equal and bilateral Secured at: 21 cm Tube secured with: Tape Dental Injury: Teeth and Oropharynx as per pre-operative assessment

## 2024-07-27 NOTE — Op Note (Signed)
 OPERATIVE REPORT  SURGEON: Redell Shoals, MD   ASSISTANT: Valery Potters, PA-C.  PREOPERATIVE DIAGNOSIS: Right intertrochanteric femur fracture.   POSTOPERATIVE DIAGNOSIS: Right intertrochanteric femur fracture.   PROCEDURE: Intramedullary fixation, Right femur.   IMPLANTS: Biomet Affixus Hip Fracture Nail, 9 by 180 mm, 125 degrees. 10.5 x 95 mm Hip Fracture Nail Lag Screw. 5 x 36 mm distal interlocking screw 1.  ANESTHESIA:  General  ESTIMATED BLOOD LOSS: 50 mL.    ANTIBIOTICS:  2 g Ancef .  DRAINS: None.  COMPLICATIONS: None.   CONDITION: PACU - hemodynamically stable.SABRA   BRIEF CLINICAL NOTE: Tami Shea is a 78 y.o. female who presented with an intertrochanteric femur fracture. The patient was admitted to the hospitalist service and underwent perioperative risk stratification and medical optimization. The risks, benefits, and alternatives to the procedure were explained, and the patient elected to proceed.  PROCEDURE IN DETAIL: Surgical site was marked by myself. The patient was taken to the operating room and anesthesia was induced on the bed. The patient was then transferred to the [facility] table and the nonoperative lower extremity was scissored underneath the operative side. The fracture was reduced with traction, internal rotation, and adduction. The hip was prepped and draped in the normal sterile surgical fashion. Timeout was called verifying side and site of surgery. Preop antibiotics were given with 60 minutes of beginning the procedure.  Fluoroscopy was used to define the patient's anatomy. A 4 cm incision was made just proximal to the tip of the greater trochanter. The awl was used to obtain the standard starting point for a trochanteric entry nail under fluoroscopic control. The guidepin was placed. The entry reamer was used to open the proximal femur.  On the back table, the nail was assembled onto the jig. The nail was placed into the femur without any difficulty. Through  a separate stab incision, the cannula was placed down to the bone in preparation for the cephalomedullary device. A guidepin was placed into the femoral head using AP and lateral fluoroscopy views. The pin was measured, and then reaming was performed to the appropriate depth. The lag screw was inserted to the appropriate depth. The fracture was compressed through the jig. The setscrew was tightened and then loosened one quarter turn. A separate stab incision was created, and the distal interlocking screw was placed using standard AO technique. The jig was removed. Final AP and lateral fluoroscopy views were obtained to confirm fracture reduction and hardware placement. Tip apex distance was appropriate. There was no chondral penetration. The wounds were copiously irrigated with saline. The wound was closed in layers with #1 Vicryl for the fascia, 2-0 Monocryl for the deep dermal layer, and staples + Dermabond skin. Once the glue was fully hardened, sterile dressing was applied. The patient was then awakened from anesthesia and taken to the PACU in stable condition. Sponge needle and instrument counts were correct at the end of the case 2. There were no known complications.  We will readmit the patient to the hospitalist. Weightbearing status will be weightbearing as tolerated with a walker. We will begin Lovenox  for DVT prophylaxis. The patient will mobilize out of bed with physical therapy and undergo disposition planning.  Please note that a surgical assistant was a medical necessity for this procedure to perform it in a safe and expeditious manner. Assistant was necessary to provide appropriate retraction of vital neurovascular structures, to prevent femoral fracture, and to allow for anatomic placement of the prosthesis.

## 2024-07-28 ENCOUNTER — Other Ambulatory Visit: Payer: Self-pay

## 2024-07-28 DIAGNOSIS — C3490 Malignant neoplasm of unspecified part of unspecified bronchus or lung: Secondary | ICD-10-CM | POA: Diagnosis not present

## 2024-07-28 DIAGNOSIS — N184 Chronic kidney disease, stage 4 (severe): Secondary | ICD-10-CM | POA: Diagnosis not present

## 2024-07-28 DIAGNOSIS — D72829 Elevated white blood cell count, unspecified: Secondary | ICD-10-CM

## 2024-07-28 DIAGNOSIS — S72001A Fracture of unspecified part of neck of right femur, initial encounter for closed fracture: Secondary | ICD-10-CM | POA: Diagnosis not present

## 2024-07-28 LAB — COMPREHENSIVE METABOLIC PANEL WITH GFR
ALT: 9 U/L (ref 0–44)
AST: 31 U/L (ref 15–41)
Albumin: 3.4 g/dL — ABNORMAL LOW (ref 3.5–5.0)
Alkaline Phosphatase: 58 U/L (ref 38–126)
Anion gap: 14 (ref 5–15)
BUN: 17 mg/dL (ref 8–23)
CO2: 23 mmol/L (ref 22–32)
Calcium: 9.4 mg/dL (ref 8.9–10.3)
Chloride: 105 mmol/L (ref 98–111)
Creatinine, Ser: 1.45 mg/dL — ABNORMAL HIGH (ref 0.44–1.00)
GFR, Estimated: 37 mL/min — ABNORMAL LOW (ref >60)
Glucose, Bld: 117 mg/dL — ABNORMAL HIGH (ref 70–99)
Potassium: 3.7 mmol/L (ref 3.5–5.1)
Sodium: 142 mmol/L (ref 135–145)
Total Bilirubin: 0.7 mg/dL (ref 0.0–1.2)
Total Protein: 7.9 g/dL (ref 6.5–8.1)

## 2024-07-28 LAB — CBC WITH DIFFERENTIAL/PLATELET
Abs Immature Granulocytes: 0.04 K/uL (ref 0.00–0.07)
Basophils Absolute: 0 K/uL (ref 0.0–0.1)
Basophils Relative: 0 %
Eosinophils Absolute: 0 K/uL (ref 0.0–0.5)
Eosinophils Relative: 0 %
HCT: 35.4 % — ABNORMAL LOW (ref 36.0–46.0)
Hemoglobin: 11.2 g/dL — ABNORMAL LOW (ref 12.0–15.0)
Immature Granulocytes: 0 %
Lymphocytes Relative: 13 %
Lymphs Abs: 1.5 K/uL (ref 0.7–4.0)
MCH: 28.3 pg (ref 26.0–34.0)
MCHC: 31.6 g/dL (ref 30.0–36.0)
MCV: 89.4 fL (ref 80.0–100.0)
Monocytes Absolute: 1.4 K/uL — ABNORMAL HIGH (ref 0.1–1.0)
Monocytes Relative: 12 %
Neutro Abs: 8.1 K/uL — ABNORMAL HIGH (ref 1.7–7.7)
Neutrophils Relative %: 75 %
Platelets: 141 K/uL — ABNORMAL LOW (ref 150–400)
RBC: 3.96 MIL/uL (ref 3.87–5.11)
RDW: 14.5 % (ref 11.5–15.5)
WBC: 11 K/uL — ABNORMAL HIGH (ref 4.0–10.5)
nRBC: 0 % (ref 0.0–0.2)

## 2024-07-28 LAB — PHOSPHORUS: Phosphorus: 2.1 mg/dL — ABNORMAL LOW (ref 2.5–4.6)

## 2024-07-28 LAB — MAGNESIUM: Magnesium: 2.1 mg/dL (ref 1.7–2.4)

## 2024-07-28 MED ORDER — SODIUM CHLORIDE 0.9% FLUSH: 10.0000 mL | INTRAVENOUS | Status: DC | PRN

## 2024-07-28 MED ORDER — CHLORHEXIDINE GLUCONATE CLOTH 2 % EX PADS
6.0000 | MEDICATED_PAD | Freq: Every day | CUTANEOUS | Status: DC
  Administered 2024-07-28 – 2024-08-02 (×6): 6 via TOPICAL

## 2024-07-28 MED ORDER — K PHOS MONO-SOD PHOS DI & MONO 155-852-130 MG PO TABS
500.0000 mg | ORAL_TABLET | Freq: Once | ORAL | Status: AC
  Administered 2024-07-28: 500 mg via ORAL
  Filled 2024-07-28: qty 2

## 2024-07-28 NOTE — Evaluation (Signed)
 Physical Therapy Evaluation Patient Details Name: Tyrianna Lightle Kreger MRN: [id number] DOB: 1945/12/31 Today's Date: 07/28/2024  History of Present Illness  78 yo female admitted with R hip fracture after sustaining a fall. S/P IM fixation 9/19. Hx of stage IV NSCLC, CKD  Clinical Impression  On eval, pt required Mod A +2 for mobility. She was able to stand at bedside x 2 with RW. She requires constant multimodal cueing for safe mobility at this time. Pt reported some pain with activity but appeared to tolerate session quite well. Pt presents with general weakness, decreased activity tolerance, and impaired gait and balance. She is at risk for further falls when mobilizing. Pt's daughter, who is very supportive and helpful, was present during session. Will continue to follow pt during this hospital stay. Patient will benefit from continued inpatient follow up therapy, <3 hours/[place]         If plan is discharge home, recommend the following: Two people to help with walking and/or transfers;Two people to help with bathing/dressing/bathroom;Assistance with cooking/housework;Assist for transportation;Help with stairs or ramp for entrance   Can travel by private vehicle   No    Equipment Recommendations None recommended by PT  Recommendations for Other Services       Functional Status Assessment Patient has had a recent decline in their functional status and demonstrates the ability to make significant improvements in function in a reasonable and predictable amount of time.     Precautions / Restrictions Precautions Precautions: Fall Precaution/Restrictions Comments: incontinent Restrictions Weight Bearing Restrictions Per Provider Order: No Other Position/Activity Restrictions: WBAT      Mobility  Bed Mobility Overal bed mobility: Needs Assistance Bed Mobility: Supine to Sit, Sit to Supine     Supine to sit: Mod assist, +2 for physical assistance, +2 for safety/equipment, HOB elevated,  Used rails Sit to supine: Mod assist, +2 for physical assistance, +2 for safety/equipment, HOB elevated, Used rails   General bed mobility comments: Increased time. Max repeated cueing required. Assist for trunk and bil LEs. Utilized bedpad to assist with getting positioned at EOB.    Transfers Overall transfer level: Needs assistance Equipment used: Rolling walker (2 wheels) Transfers: Sit to/from Stand Sit to Stand: Mod assist, +2 physical assistance, +2 safety/equipment, From elevated surface           General transfer comment: x 2 for safety, technique, tolerance. Assist to power up, stabilize, control descent. Max repeated cueing for safety, technique, posture, hand/feet placement. Mod posterior bias. Attempted to work on steps forwards and sideways-pt unable    Ambulation/Gait               General Gait Details: NT-pt not yet able  Stairs            Wheelchair Mobility     Tilt Bed    Modified Rankin (Stroke Patients Only)       Balance Overall balance assessment: Needs assistance, History of Falls Sitting-balance support: Feet supported, Bilateral upper extremity supported Sitting balance-Leahy Scale: Poor   Postural control: Posterior lean   Standing balance-Leahy Scale: Poor                               Pertinent Vitals/Pain Pain Assessment Pain Assessment: Faces Faces Pain Scale: Hurts little more Pain Location: R LE with activity Pain Descriptors / Indicators: Operative site guarding Pain Intervention(s): Limited activity within patient's tolerance, Monitored during session, Repositioned    Home  Living Family/patient expects to be discharged to:: Unsure Living Arrangements: Children Available Help at Discharge: Family;Available PRN/intermittently Type of Home: House Home Access: Stairs to enter Entrance Stairs-Rails: Right;Left Entrance Stairs-Number of Steps: 2   Home Layout: One level Home Equipment: Agricultural consultant  (2 wheels);Cane - quad      Prior Function Prior Level of Function : Needs assist             Mobility Comments: ambulatory at baseline. has RW and cane but not using as much as she should per daughter       Extremity/Trunk Assessment   Upper Extremity Assessment Upper Extremity Assessment: Defer to OT evaluation    Lower Extremity Assessment Lower Extremity Assessment: Generalized weakness    Cervical / Trunk Assessment Cervical / Trunk Assessment: Kyphotic  Communication   Communication Communication: No apparent difficulties    Cognition Arousal: Alert Behavior During Therapy: Anxious, WFL for tasks assessed/performed   PT - Cognitive impairments: Problem solving, Sequencing, Initiation, Memory, Safety/Judgement, Orientation   Orientation impairments: Situation, Time                     Following commands: Impaired Following commands impaired: Follows one step commands inconsistently     Cueing Cueing Techniques: Verbal cues, Gestural cues, Tactile cues, Visual cues     General Comments      Exercises     Assessment/Plan    PT Assessment Patient needs continued PT services  PT Problem List Decreased strength;Decreased range of motion;Decreased activity tolerance;Decreased balance;Decreased mobility;Decreased knowledge of use of DME;Pain;Decreased cognition;Decreased safety awareness       PT Treatment Interventions DME instruction;Gait training;Functional mobility training;Therapeutic activities;Therapeutic exercise;Balance training;Patient/family education    PT Goals (Current goals can be found in the Care Plan section)  Acute Rehab PT Goals Patient Stated Goal: daughter hopeful pt could return home PT Goal Formulation: With family Time For Goal Achievement: 08/11/24 Potential to Achieve Goals: Good    Frequency Min 3X/week     Co-evaluation               AM-PAC PT 6 Clicks Mobility  Outcome Measure Help needed turning  from your back to your side while in a flat bed without using bedrails?: Total Help needed moving from lying on your back to sitting on the side of a flat bed without using bedrails?: Total Help needed moving to and from a bed to a chair (including a wheelchair)?: Total Help needed standing up from a chair using your arms (e.g., wheelchair or bedside chair)?: Total Help needed to walk in hospital room?: Total Help needed climbing 3-5 steps with a railing? : Total 6 Click Score: 6    End of Session Equipment Utilized During Treatment: Gait belt Activity Tolerance: Patient tolerated treatment well;Patient limited by pain Patient left: in bed;with call bell/phone within reach;with bed alarm set;with family/visitor present   PT Visit Diagnosis: Pain;History of falling (Z91.81);Muscle weakness (generalized) (M62.81);Difficulty in walking, not elsewhere classified (R26.2);Other abnormalities of gait and mobility (R26.89) Pain - Right/Left: Right Pain - part of body: Leg    Time: 8762-8697 PT Time Calculation (min) (ACUTE ONLY): 25 min   Charges:   PT Evaluation $PT Eval Low Complexity: 1 Low PT Treatments $Therapeutic Activity: 8-22 mins PT General Charges $$ ACUTE PT VISIT: 1 Visit           Dannial SQUIBB, PT Acute Rehabilitation  Office: [phone]

## 2024-07-28 NOTE — Progress Notes (Signed)
 PROGRESS NOTE    Tami Shea  FMW:[id number] DOB: 01/25/1946 DOA: 07/26/2024 PCP: [name], MD   Brief Narrative:  Tami Shea  is a 78 y.o. female, with medical history of stage IV non-small cell lung cancer, adenocarcinoma, who is currently undergoing palliative systemic chemo immunotherapy at cancer center and Patient is on treatment with Keytruda  every 3 weeks.  Patient went home after treatment from cancer center and then when she stepped out of her car she fell on her right hip and was unable to walk.  Was found to have a right intertrochanteric femur fracture and brought in and Orthopedics was consulted and took the patient for intramedullary fixation of her right femur on 9/19 and she is POD 1. PT now recommending SNF.  Assessment and Plan:  Right femur fracture: X-ray shows osteopenia, nondisplaced intertrochanteric fracture of right hip.  DVT prophylaxis as per orthopedics and have initiated Enoxaparin  inpatient and recommending ASA as an outpatient. Orthopedic Surgery consulted and patient taken for Surgical intervention 9/19. Per ortho WBAT RLE. Continue with current pain control with p.o. acetaminophen , hydrocodone  1-2 tabs p.o. every 6 as needed, methocarbamol , IV morphine  0.5 mg IV Q2 as needed for severe pain and surgical prophylaxis with Cefazolin . Bowel regimen w/ Senna 1 tab po BID and Docusate 1 tab po BID. Initiated bowel regimen and will need PT/OT recommending SNF  Leukocytosis: Mild and likely 2/2 to above. WBC went from 7.5 -> 10.8 -> 11.0. CTM for S/Sx of Infection. Repeat CBC in the AM   Hypokalemia: Potassium is low in the setting of diarrhea.  K+ Trend:  Recent Labs  Lab 07/04/24 0836 07/26/24 1017 07/26/24 1727 07/27/24 0341 07/28/24 0354  K 3.3* 2.8* 2.6* 2.9* 3.7  -Mag is now 2.1. Replete w/ po K Phos  Neutral 500 mg x1. CTM and Replete as Necessary. Repeat CMP in the AM   Recent Cataract Surgery: Continue ophthalmologic eyedrops with prednisolone  acetate  1 drop in the left eye 3 times daily, gatifloxacin  1 drop in the left eye 3 times daily and Ketorolac  1 drop BID in Left Eye   Non-small cell lung cancer-followed by oncology as outpatient.  Received cycle #7 of Keytruda  on 9/18.  Essential Hypertension: Continue with home Amlodipine  10 mg po Daily.  Can to monitor blood pressures per protocol.  Last blood pressure reading was 156/98   Diarrhea: Likely in setting of chemotherapy.  Continue to monitor.  Gentle hydration with LR at 75 mL/hr x1 Ord.  Continue supportive care and Imodium for diarrhea management  CKD Stage IV: Baseline creatinine levels around 2 BUN/Cr Trend: Recent Labs  Lab 07/04/24 0836 07/26/24 1017 07/26/24 1727 07/27/24 0012 07/27/24 0341 07/27/24 1939 07/28/24 0354  BUN 28* 26* 22  --  22  --  17  CREATININE 2.32* 1.99* 1.89* 1.65* 1.62* 1.43* 1.45*  -IVF w/ LR @ 75 mL/hr x1 [place] -Avoid Nephrotoxic Medications, Contrast Dyes, Hypotension and Dehydration to Ensure Adequate Renal Perfusion and will need to Renally Adjust Meds. CTM and Trend Renal Function carefully and repeat CMP in the AM; she is awaiting a referral to nephrology for further evaluation management  Hypophosphatemia: Mild. Phos Level is now 2.1. Replete w/ po K Phos  Neutral 500 mg x1. CTM and Replete as Necessary. Repeat CMP in the AM  Normocytic Anemia/Anemia of Chronic Disease: Hgb/Hct Trend improved after blood Transfusions:  Recent Labs  Lab 07/19/24 0953 07/26/24 1017 07/26/24 1727 07/27/24 0012 07/27/24 0341 07/27/24 1600 07/28/24 0354  HGB 9.3* 9.4*  9.0* 8.3* 8.4* 11.4* 11.2*  HCT 29.2* 28.8* 29.0* 27.2* 27.1* 35.6* 35.4*  MCV 87.2 85.7 90.1 89.2 89.1 89.2 89.4  -S/p 2 units of pRBCs today per Orthopedic [facility]ery on 9/19. CTM for S/Sx of Bleeding; No overt bleeding noted. Repeat CBC in the AM  Thrombocytopenia: Mild. Plt Count Trend Fluctuating and went from 176 -> 158 -> 159 -> 145 -> 156 -> 141. CTM for S/Sx of Bleeding; No overt bleeding  noted. Repeat CBC in the AM  Hypoalbuminemia: Patient's Albumin Lvl went from 3.5 -> 3.6 -> 3.4. CTM and Trend and repeat CMP in the AM    DVT prophylaxis: enoxaparin  (LOVENOX ) injection 30 mg Start: 07/28/24 0800 SCDs Start: 07/27/24 1513    Code Status: Full Code Family Communication: No family present @ bedside  Disposition Plan:  Level of care: Telemetry Status is: Inpatient Remains inpatient appropriate because: Needs further clinical improvement and PT/OT recommending SNF   Consultants:  Orthopedic Surgery  Procedures:  As delineated as above  Antimicrobials:  Anti-infectives (From admission, onward)    Start     Dose/Rate Route Frequency Ordered Stop   07/27/24 1800  ceFAZolin  (ANCEF ) IVPB 2g/100 mL premix        2 g 200 mL/hr over 30 Minutes Intravenous Every 6 hours 07/27/24 1512 07/28/24 0555   07/27/24 0600  ceFAZolin  (ANCEF ) IVPB 2g/100 mL premix        2 g 200 mL/hr over 30 Minutes Intravenous On call to O.R. 07/26/24 2249 07/27/24 1200       Subjective: Seen and examined at bedside and she is doing okay today.  Denies any pain when lying there but states whenever she tries to move she has some pain.  Thinks she needs to have a bowel movement soon.  No nausea or vomiting.  No other concerns or complaints at this time.  Objective: Vitals:   07/27/24 2102 07/28/24 0235 07/28/24 0529 07/28/24 1012  BP: (!) 152/96 (!) 140/84 (!) 142/89 (!) 156/98  Pulse: 88 83 85 98  Resp: 20 20 16 19   Temp: 99.6 F (37.6 C) 98.7 F (37.1 C) 99 F (37.2 C) 98.7 F (37.1 C)  TempSrc: Oral Oral Oral Oral  SpO2: 98% 91% 93% 94%  Weight:      Height:        Intake/Output Summary (Last 24 hours) at 07/28/2024 1358 Last data filed at 07/28/2024 9081 Gross per 24 hour  Intake 932.07 ml  Output 3950 ml  Net -3017.93 ml   Filed Weights   07/27/24 1040  Weight: 64.3 kg   Examination: Physical Exam:  Constitutional: WN/WD elderly chronically ill-appearing Caucasian  female in no acute distress Respiratory: Diminished to auscultation bilaterally, no wheezing, rales, rhonchi or crackles. Normal respiratory effort and patient is not tachypenic. No accessory muscle use.  Unlabored breathing Cardiovascular: RRR, no murmurs / rubs / gallops. S1 and S2 auscultated. No extremity edema. Abdomen: Soft, non-tender, non-distended. Bowel sounds positive.  GU: Deferred. Musculoskeletal: No clubbing / cyanosis of digits/nails. No joint deformity upper and lower extremities. Skin: No rashes, lesions, ulcers on limited skin evaluation. No induration; Warm and dry.  Neurologic: CN 2-12 grossly intact with no focal deficits. Romberg sign and cerebellar reflexes not assessed.  Psychiatric: Normal judgment and insight. Alert and oriented x 3. Normal mood and appropriate affect.   Data Reviewed: I have personally reviewed following labs and imaging studies  CBC: Recent Labs  Lab 07/26/24 1017 07/26/24 1727 07/27/24 0012 07/27/24 0341 07/27/24  1600 07/28/24 0354  WBC 6.0 7.6 7.7 7.5 10.8* 11.0*  NEUTROABS 3.1 5.7  --   --   --  8.1*  HGB 9.4* 9.0* 8.3* 8.4* 11.4* 11.2*  HCT 28.8* 29.0* 27.2* 27.1* 35.6* 35.4*  MCV 85.7 90.1 89.2 89.1 89.2 89.4  PLT 176 158 159 145* 156 141*   Basic Metabolic Panel: Recent Labs  Lab 07/26/24 1017 07/26/24 1727 07/27/24 0012 07/27/24 0341 07/27/24 1939 07/28/24 0354  NA 143 142  --  143  --  142  K 2.8* 2.6*  --  2.9*  --  3.7  CL 107 105  --  107  --  105  CO2 28 23  --  22  --  23  GLUCOSE 91 99  --  104*  --  117*  BUN 26* 22  --  22  --  17  CREATININE 1.99* 1.89* 1.65* 1.62* 1.43* 1.45*  CALCIUM 9.2 9.3  --  9.1  --  9.4  MG  --   --  1.8  --   --  2.1  PHOS  --   --   --  2.5  --  2.1*   GFR: Estimated Creatinine Clearance: 27.6 mL/min (A) (by C-G formula based on SCr of 1.45 mg/dL (H)). Liver Function Tests: Recent Labs  Lab 07/26/24 1017 07/28/24 0354  AST 23 31  ALT 8 9  ALKPHOS 55 58  BILITOT 0.5 0.7   PROT 8.5* 7.9  ALBUMIN 3.6 3.4*   No results for input(s): LIPASE, AMYLASE in the last 168 hours. No results for input(s): AMMONIA in the last 168 hours. Coagulation Profile: Recent Labs  Lab 07/26/24 1759  INR 1.1   Cardiac Enzymes: No results for input(s): CKTOTAL, CKMB, CKMBINDEX, TROPONINI in the last 168 hours. BNP (last 3 results) No results for input(s): PROBNP in the last 8760 hours. HbA1C: No results for input(s): HGBA1C in the last 72 hours. CBG: No results for input(s): GLUCAP in the last 168 hours. Lipid Profile: No results for input(s): CHOL, HDL, LDLCALC, TRIG, CHOLHDL, LDLDIRECT in the last 72 hours. Thyroid  Function Tests: No results for input(s): TSH, T4TOTAL, FREET4, T3FREE, THYROIDAB in the last 72 hours. Anemia Panel: No results for input(s): VITAMINB12, FOLATE, FERRITIN, TIBC, IRON, RETICCTPCT in the last 72 hours. Sepsis Labs: No results for input(s): PROCALCITON, LATICACIDVEN in the last 168 hours.  Recent Results (from the past 240 hours)  Surgical pcr screen     Status: None   Collection Time: 07/27/24  1:26 AM   Specimen: Nasal Mucosa; Nasal Swab  Result Value Ref Range Status   MRSA, PCR NEGATIVE NEGATIVE Final   Staphylococcus aureus NEGATIVE NEGATIVE Final    Comment: (NOTE) The Xpert SA Assay (FDA approved for NASAL specimens in patients 56 years of age and older), is one component of a comprehensive surveillance program. It is not intended to diagnose infection nor to guide or monitor treatment. Performed at [facility], [address], [place], [place] [address]     Radiology Studies: DG HIP UNILAT WITH PELVIS 1V RIGHT Result Date: 07/27/2024 EXAM: 1 VIEW XRAY OF THE RIGHT HIP 07/27/2024 01:04:00 PM COMPARISON: Radiographs 07/26/2024 CLINICAL HISTORY: [id number] Surgery, elective [id number]. Right Im nail; FINDINGS: Two fluoroscopic spot views of the right hip  submitted from the operating room. Femoral intramedullary nail with transtrochanteric and distal locking screw fixation traverses proximal femur fracture. FT: 49 sec FD: 5.8463 mGy IMPRESSION: 1. Procedure fluoroscopy during proximal femur fracture fixation.  Electronically signed by: Andrea Gasman MD 07/27/2024 03:07 PM EDT RP Workstation: [id number]   DG C-Arm 1-60 Min-No Report Result Date: 07/27/2024 Fluoroscopy was utilized by the requesting physician.  No radiographic interpretation.   DG C-Arm 1-60 Min-No Report Result Date: 07/27/2024 Fluoroscopy was utilized by the requesting physician.  No radiographic interpretation.   DG Knee Right Port Result Date: 07/26/2024 CLINICAL DATA:  Fall and trauma to the right knee. EXAM: PORTABLE RIGHT KNEE - 1-2 VIEW COMPARISON:  None Available. FINDINGS: There is no acute fracture or dislocation. The bones are osteopenic. Moderate arthritic changes of the knee. No significant effusion. The soft tissues are unremarkable. IMPRESSION: 1. No acute fracture or dislocation. 2. Moderate arthritic changes. Electronically Signed   By: Vanetta Chou M.D.   On: 07/26/2024 18:53   DG Hip Unilat W or Wo Pelvis 2-3 Views Right Result Date: 07/26/2024 CLINICAL DATA:  fall EXAM: DG HIP (WITH OR WITHOUT PELVIS) 2-3V RIGHT COMPARISON:  June 28, 2024 FINDINGS: Osteopenia.No evidence of pelvic fracture or diastasis.Nondisplaced intertrochanteric fracture of the right hip. No dislocation.Multilevel degenerative disc disease of the spine. Mild osteoarthritis of both hips. Peripheral vascular atherosclerosis. IMPRESSION: Osteopenia. Nondisplaced, intertrochanteric fracture of the right hip. Electronically Signed   By: Rogelia Myers M.D.   On: 07/26/2024 17:24   DG Chest 1 View Result Date: 07/26/2024 CLINICAL DATA:  fall EXAM: CHEST  1 VIEW COMPARISON:  June 28, 2024 FINDINGS: Lower lung volumes. Bilateral perihilar interstitial opacities. Moderate cardiomegaly with  superimposed large pericardial effusion again noted. Right chest port in place terminating at the cavoatrial junction. Tortuous aorta with aortic atherosclerosis. No acute fracture or destructive lesions. Multilevel thoracic osteophytosis. IMPRESSION: 1. Bilateral perihilar interstitial opacities,, which may represent bronchovascular crowding due to low lung volumes, interstitial edema, or atypical/viral infection, in the correct clinical context. 2. Moderate cardiomegaly and superimposed pericardial effusion, unchanged. Electronically Signed   By: Rogelia Myers M.D.   On: 07/26/2024 17:22   Scheduled Meds:  sodium chloride    Intravenous Once   amLODipine   10 mg Oral Daily   Chlorhexidine  Gluconate Cloth  6 each Topical Daily   docusate sodium   100 mg Oral BID   enoxaparin  (LOVENOX ) injection  30 mg Subcutaneous Q24H   feeding supplement  237 mL Oral BID BM   gatifloxacin   1 drop Left Eye TID   ketorolac   1 drop Left Eye BID   phosphorus  500 mg Oral Once   prednisoLONE  acetate  1 drop Left Eye TID   senna  1 tablet Oral BID   Continuous Infusions:  sodium chloride      lactated ringers  75 mL/hr at 07/28/24 0555    LOS: 2 days   Alejandro Marker, DO [facility] Available via Epic secure chat 7am-7pm After these hours, please refer to coverage provider listed on [email or web address] 07/28/2024, 1:58 PM

## 2024-07-28 NOTE — Progress Notes (Signed)
   Subjective:  Tami Shea is a 78 y.o. female, 1 [place] Post-Op   /p Procedure(s): FIXATION, FRACTURE, INTERTROCHANTERIC, WITH INTRAMEDULLARY ROD   Patient reports pain as mild to moderate.  Very talkative, no complaints. Denies fever or chills, numbness or tingling.   Wearing mitten restraints  Objective:   VITALS:   Vitals:   07/27/24 1730 07/27/24 2102 07/28/24 0235 07/28/24 0529  BP: (!) 156/94 (!) 152/96 (!) 140/84 (!) 142/89  Pulse: 89 88 83 85  Resp: 20 20 20 16   Temp: 97.7 F (36.5 C) 99.6 F (37.6 C) 98.7 F (37.1 C) 99 F (37.2 C)  TempSrc: Oral Oral Oral Oral  SpO2: 97% 98% 91% 93%  Weight:      Height:       In hospital bed NAD, restraint mittens on  RLE:  Neurovascular intact Sensation intact distally Intact pulses distally Dorsiflexion/Plantar flexion intact Incision: dressing C/D/I Compartment soft Calf soft non tender. No pain with log roll.   Lab Results  Component Value Date   WBC 11.0 (H) 07/28/2024   HGB 11.2 (L) 07/28/2024   HCT 35.4 (L) 07/28/2024   MCV 89.4 07/28/2024   PLT 141 (L) 07/28/2024   BMET    Component Value Date/Time   NA 142 07/28/2024 0354   NA 142 10/12/2021 1043   K 3.7 07/28/2024 0354   CL 105 07/28/2024 0354   CO2 23 07/28/2024 0354   GLUCOSE 117 (H) 07/28/2024 0354   BUN 17 07/28/2024 0354   BUN 17 10/12/2021 1043   CREATININE 1.45 (H) 07/28/2024 0354   CREATININE 1.99 (H) 07/26/2024 1017   CALCIUM 9.4 07/28/2024 0354   EGFR 47 (L) 10/12/2021 1043   GFRNONAA 37 (L) 07/28/2024 0354   GFRNONAA 25 (L) 07/26/2024 1017     @[email or web address]   Assessment/Plan: 1 Tami Shea Post-Op   Principal Problem:   Hip fracture (HCC)   Advance diet Up with therapy  Dispo: Pending PT eval  Weightbearing Status: WBAT RLE DVT Prophylaxis: Lovenox  inpatient, aspirin  outpatient.    Tami Shea 07/28/2024, 9:07 AM  Dayle Moores PA-C  Physician Assistant with Dr. Sharl Gaba [facility]

## 2024-07-28 NOTE — Evaluation (Signed)
 Occupational Therapy Evaluation Patient Details Name: Tami Shea MRN: [id number] DOB: 03/12/1946 Today's Date: 07/28/2024   History of Present Illness   78 yr old female admitted with R intertrochanteric femur fracture after sustaining a fall. S/P IM fixation 07/27/24. PMH: stage IV NSCLC on chemo, HTN, kidney stones     Clinical Impressions The pt is currently presenting with the below listed deficits (see OT problem list). As such, her ADL performance is compromised and she requires assist for self-care management. During the OT evaluation, she required max assist to roll to the left and total assist for simulated lower body dressing in bed. She was noted to be with disorientation to place, month, year, and situation. She had difficulty with memory/recall and intermittent confusion, requiring frequent cues in this regard. She will benefit from further OT services to maximize her safety and independence with self care tasks. Patient will benefit from continued inpatient follow up therapy, <3 hours/[facility].      If plan is discharge home, recommend the following:   Two people to help with walking and/or transfers;A lot of help with bathing/dressing/bathroom;Direct supervision/assist for medications management;Supervision due to cognitive status;Direct supervision/assist for financial management;Assistance with cooking/housework;Assist for transportation;Help with stairs or ramp for entrance     Functional Status Assessment   Patient has had a recent decline in their functional status and demonstrates the ability to make significant improvements in function in a reasonable and predictable amount of time.     Equipment Recommendations   Other (comment) (to be determined pending functional progress)     Recommendations for Other Services         Precautions/Restrictions   Precautions Precautions: Fall Restrictions Weight Bearing Restrictions Per Provider Order: Yes RLE Weight  Bearing Per Provider Order: Weight bearing as tolerated     Mobility Bed Mobility Overal bed mobility: Needs Assistance Bed Mobility: Rolling Rolling: Max assist (rolling to the left)                      ADL either performed or assessed with clinical judgement   ADL Overall ADL's : Needs assistance/impaired Eating/Feeding: Set up;Supervision/ safety;Bed level;Minimal assistance   Grooming: Minimal assistance;Cueing for sequencing;Bed level           Upper Body Dressing : Moderate assistance;Bed level   Lower Body Dressing: Total assistance;Bed level       Toileting- Clothing Manipulation and Hygiene: Total assistance;Bed level Toileting - Clothing Manipulation Details (indicate cue type and reason): based on clinical judgement              Pertinent Vitals/Pain Pain Assessment Pain Assessment: Faces Pain Score: 4  Pain Location: R LE with activity Pain Descriptors / Indicators: Operative site guarding Pain Intervention(s): Limited activity within patient's tolerance, Monitored during session, Repositioned     Extremity/Trunk Assessment Upper Extremity Assessment Upper Extremity Assessment: RUE deficits/detail;LUE deficits/detail RUE Deficits / Details: AROM WFL. Grip strength 4-/5 LUE Deficits / Details: AROM WFL. Grip strength 4-/5   Lower Extremity Assessment Lower Extremity Assessment: Generalized weakness       Communication     Cognition Arousal: Alert Behavior During Therapy: Anxious Cognition: Cognition impaired, No family/caregiver present to determine baseline   Orientation impairments: Time, Situation, Place   Memory impairment (select all impairments): Short-term memory, Declarative long-term memory, Working memory Attention impairment (select first level of impairment): Sustained attention, Divided attention Executive functioning impairment (select all impairments): Organization, Reasoning OT - Cognition Comments: Oriented to  person &  city. Disoriented to month, year, to being in the hospital, and to situation. Impaired memory noted, however OT is unsure if this is typical of the pt's baseline or if deficits are acute in nature. Pt also noted to be with confusion.                 Following commands: Impaired Following commands impaired: Follows one step commands inconsistently     Cueing  General Comments   Cueing Techniques: Verbal cues;Gestural cues;Tactile cues;Visual cues              Home Living Family/patient expects to be discharged to:: Unsure Living Arrangements: Children Available Help at Discharge: Family Type of Home: House Home Access: Stairs to enter [employer] of Steps: 2 Entrance Stairs-Rails: Right;Left Home Layout: One level               Home Equipment: Agricultural consultant (2 wheels);Cane - quad   Additional Comments: Info contained here was taken from the PT eval completed today, as the pt was a questionable historian.      Prior Functioning/Environment Prior Level of Function : Needs assist             Mobility Comments: Ambulatory at baseline. Has RW and cane but not using as much as she should, per daughter. ADLs Comments:  (The pt reported being independent with ADLs, however these reports may need to be verified.)    OT Problem List: Decreased strength;Decreased range of motion;Decreased activity tolerance;Impaired balance (sitting and/or standing);Decreased cognition;Decreased knowledge of use of DME or AE;Decreased knowledge of precautions;Pain   OT Treatment/Interventions: Self-care/ADL training;Therapeutic exercise;Therapeutic activities;Cognitive remediation/compensation;Energy conservation;Patient/family education;DME and/or AE instruction;Balance training      OT Goals(Current goals can be found in the care plan section)   Acute Rehab OT Goals OT Goal Formulation: Patient unable to participate in goal setting Time For Goal  Achievement: 08/11/24 Potential to Achieve Goals: Good ADL Goals Pt Will Perform Eating: with set-up;sitting Pt Will Perform Grooming: with set-up;sitting Pt Will Perform Upper Body Dressing: with set-up;with supervision;sitting Pt Will Transfer to Toilet: with contact guard assist;stand pivot transfer;bedside commode Additional ADL Goal #1: The pt will perform bed mobility with CGA, in prep for progressive ADL participation.   OT Frequency:  Min 2X/week       AM-PAC OT 6 Clicks Daily Activity     Outcome Measure Help from another person eating meals?: A Little Help from another person taking care of personal grooming?: A Little Help from another person toileting, which includes using toliet, bedpan, or urinal?: Total Help from another person bathing (including washing, rinsing, drying)?: A Lot Help from another person to put on and taking off regular upper body clothing?: A Lot Help from another person to put on and taking off regular lower body clothing?: Total 6 Click Score: 12   End of Session Equipment Utilized During Treatment: Other (comment) (N/A) Nurse Communication: Mobility status  Activity Tolerance: Other (comment) (Fair tolerance) Patient left: in bed;with call bell/phone within reach;with bed alarm set  OT Visit Diagnosis: Muscle weakness (generalized) (M62.81);History of falling (Z91.81);Pain;Other abnormalities of gait and mobility (R26.89) Pain - Right/Left: Right Pain - part of body: Hip                Time: 8295-8279 OT Time Calculation (min): 16 min Charges:  OT General Charges $OT Visit: 1 Visit OT Evaluation $OT Eval Moderate Complexity: 1 Mod    Amita Atayde J Harris, OTR/L 07/28/2024, 5:40 PM

## 2024-07-29 DIAGNOSIS — D72829 Elevated white blood cell count, unspecified: Secondary | ICD-10-CM | POA: Diagnosis not present

## 2024-07-29 DIAGNOSIS — C3490 Malignant neoplasm of unspecified part of unspecified bronchus or lung: Secondary | ICD-10-CM | POA: Diagnosis not present

## 2024-07-29 DIAGNOSIS — S72001A Fracture of unspecified part of neck of right femur, initial encounter for closed fracture: Secondary | ICD-10-CM | POA: Diagnosis not present

## 2024-07-29 DIAGNOSIS — N184 Chronic kidney disease, stage 4 (severe): Secondary | ICD-10-CM | POA: Diagnosis not present

## 2024-07-29 LAB — CBC WITH DIFFERENTIAL/PLATELET
Abs Immature Granulocytes: 0.05 K/uL (ref 0.00–0.07)
Basophils Absolute: 0 K/uL (ref 0.0–0.1)
Basophils Relative: 0 %
Eosinophils Absolute: 0 K/uL (ref 0.0–0.5)
Eosinophils Relative: 0 %
HCT: 34.2 % — ABNORMAL LOW (ref 36.0–46.0)
Hemoglobin: 11 g/dL — ABNORMAL LOW (ref 12.0–15.0)
Immature Granulocytes: 1 %
Lymphocytes Relative: 18 %
Lymphs Abs: 1.9 K/uL (ref 0.7–4.0)
MCH: 28.9 pg (ref 26.0–34.0)
MCHC: 32.2 g/dL (ref 30.0–36.0)
MCV: 90 fL (ref 80.0–100.0)
Monocytes Absolute: 1.6 K/uL — ABNORMAL HIGH (ref 0.1–1.0)
Monocytes Relative: 16 %
Neutro Abs: 6.8 K/uL (ref 1.7–7.7)
Neutrophils Relative %: 65 %
Platelets: 151 K/uL (ref 150–400)
RBC: 3.8 MIL/uL — ABNORMAL LOW (ref 3.87–5.11)
RDW: 14.7 % (ref 11.5–15.5)
WBC: 10.4 K/uL (ref 4.0–10.5)
nRBC: 0 % (ref 0.0–0.2)

## 2024-07-29 LAB — COMPREHENSIVE METABOLIC PANEL WITH GFR
ALT: <5 U/L (ref 0–44)
AST: 28 U/L (ref 15–41)
Albumin: 3.2 g/dL — ABNORMAL LOW (ref 3.5–5.0)
Alkaline Phosphatase: 55 U/L (ref 38–126)
Anion gap: 16 — ABNORMAL HIGH (ref 5–15)
BUN: 20 mg/dL (ref 8–23)
CO2: 23 mmol/L (ref 22–32)
Calcium: 9.2 mg/dL (ref 8.9–10.3)
Chloride: 103 mmol/L (ref 98–111)
Creatinine, Ser: 1.65 mg/dL — ABNORMAL HIGH (ref 0.44–1.00)
GFR, Estimated: 31 mL/min — ABNORMAL LOW (ref >60)
Glucose, Bld: 108 mg/dL — ABNORMAL HIGH (ref 70–99)
Potassium: 3 mmol/L — ABNORMAL LOW (ref 3.5–5.1)
Sodium: 141 mmol/L (ref 135–145)
Total Bilirubin: 0.9 mg/dL (ref 0.0–1.2)
Total Protein: 7.6 g/dL (ref 6.5–8.1)

## 2024-07-29 LAB — MAGNESIUM: Magnesium: 2.1 mg/dL (ref 1.7–2.4)

## 2024-07-29 LAB — PHOSPHORUS: Phosphorus: 2.7 mg/dL (ref 2.5–4.6)

## 2024-07-29 MED ORDER — POTASSIUM CHLORIDE CRYS ER 20 MEQ PO TBCR
20.0000 meq | EXTENDED_RELEASE_TABLET | Freq: Two times a day (BID) | ORAL | Status: DC
  Administered 2024-07-29 – 2024-08-02 (×9): 20 meq via ORAL
  Filled 2024-07-29 (×9): qty 1

## 2024-07-29 MED ORDER — POTASSIUM CHLORIDE CRYS ER 20 MEQ PO TBCR
40.0000 meq | EXTENDED_RELEASE_TABLET | Freq: Once | ORAL | Status: AC
  Administered 2024-07-29: 40 meq via ORAL
  Filled 2024-07-29: qty 2

## 2024-07-29 NOTE — Progress Notes (Signed)
 PROGRESS NOTE    Tymesha Ditmore Shea  FMW:[id number] DOB: 06-May-1946 DOA: 07/26/2024 PCP: [name], MD   Brief Narrative:  Tami Shea  is a 78 y.o. female, with medical history of stage IV non-small cell lung cancer, adenocarcinoma, who is currently undergoing palliative systemic chemo immunotherapy at cancer center and Patient is on treatment with Keytruda  every 3 weeks.  Patient went home after treatment from cancer center and then when she stepped out of her car she fell on her right hip and was unable to walk.  Was found to have a right intertrochanteric femur fracture and brought in and Orthopedics was consulted and took the patient for intramedullary fixation of her right femur on 9/19 and she is POD 2. PT now recommending SNF.  Assessment and Plan:  Right femur fracture: X-ray shows osteopenia, nondisplaced intertrochanteric fracture of right hip.  DVT prophylaxis as per orthopedics and have initiated Enoxaparin  inpatient and recommending ASA as an outpatient. Orthopedic Surgery consulted and patient taken for Surgical intervention 9/19. Per ortho WBAT RLE. Continue with current pain control with p.o. acetaminophen , hydrocodone  1-2 tabs p.o. every 6 as needed, methocarbamol , IV morphine  0.5 mg IV Q2 as needed for severe pain and surgical prophylaxis with Cefazolin . Bowel regimen w/ Senna 1 tab po BID and Docusate 1 tab po BID. Initiated bowel regimen and will need PT/OT recommending SNF and patient will need to follow-up with Dr. Fidel in 2 weeks  Leukocytosis: Mild and likely 2/2 to above. WBC went from 7.5 -> 10.8 -> 11.0 -> 10.4. CTM for S/Sx of Infection. Repeat CBC in the AM   Hypokalemia: Potassium is low in the setting of diarrhea.  K+ went from 3.3 -> 2.8 -> 2.6 -> 2.9 -> 3.7. -> 3.0. Replete w/ po K Phos  20 mEQ BID x2 and po KCL 40 mEQ x1. Mag is now 2.1. CTM and Replete as Necessary. Repeat CMP in the AM   Recent Cataract Surgery: Continue ophthalmologic eyedrops with Prednisolone   Acetate 1 drop in the left eye 3 times daily, and Ketorolac  1 drop BID in Left Eye; No longer taking Gatifloxacin  1 drop in the left eye 3 times daily   Non-small cell lung cancer-followed by oncology as outpatient.  Received cycle #7 of Keytruda  on 9/18.  Essential Hypertension: Continue with home Amlodipine  10 mg po Daily.  Can to monitor blood pressures per protocol.  Last blood pressure reading was on the softer side at 103/78   Diarrhea: Likely in setting of chemotherapy.  Continue to monitor.  Gentle hydration with LR at 75 mL/hr x1 Nhem.  Continue supportive care and Imodium for diarrhea management  CKD Stage IV: Baseline creatinine levels around 2 BUN/Cr Trend: Recent Labs  Lab 07/04/24 0836 07/26/24 1017 07/26/24 1727 07/27/24 0012 07/27/24 0341 07/27/24 1939 07/28/24 0354 07/29/24 0350  BUN 28* 26* 22  --  22  --  17 20  CREATININE 2.32* 1.99* 1.89* 1.65* 1.62* 1.43* 1.45* 1.65*  -IVF w/ LR @ 75 mL/hr x1 Canizares now stopped.  -Avoid Nephrotoxic Medications, Contrast Dyes, Hypotension and Dehydration to Ensure Adequate Renal Perfusion and will need to Renally Adjust Meds. CTM and Trend Renal Function carefully and repeat CMP in the AM; she is awaiting a referral to nephrology for further evaluation management  Hypophosphatemia: Mild. Phos Level is now 2.7. CTM and Replete as Necessary. Repeat CMP in the AM  Normocytic Anemia/Anemia of Chronic Disease: S/p 2 units of pRBCs per Orthopedic Sug[facility] on 9/19. Hgb/Hct Trend improved after  blood Transfusions and went from 8.3/29.0 -> 8.4/27.1 -> 11.4/35.6 -> 11.2/35.4 -> 11.0/34.2. CTM for S/Sx of Bleeding; No overt bleeding noted. Repeat CBC in the AM  Thrombocytopenia: Mild. Plt Count Trend Fluctuating and went from 176 -> 158 -> 159 -> 145 -> 156 -> 141 -> 151. CTM for S/Sx of Bleeding; No overt bleeding noted. Repeat CBC in the AM  Hypoalbuminemia: Patient's Albumin Lvl went from 3.5 -> 3.6 -> 3.4 -> 3.2. CTM and Trend and repeat CMP in  the AM    DVT prophylaxis: enoxaparin  (LOVENOX ) injection 30 mg Start: 07/28/24 0800 SCDs Start: 07/27/24 1513    Code Status: Full Code Family Communication: D/w family @ bedside  Disposition Plan:  Level of care: Telemetry Status is: Inpatient Remains inpatient appropriate because: Needs SNF Bed and Insurance Authorization   Consultants:  Orthopedic Surgery  Procedures:  As delineated as above  Antimicrobials:  Anti-infectives (From admission, onward)    Start     Dose/Rate Route Frequency Ordered Stop   07/27/24 1800  ceFAZolin  (ANCEF ) IVPB 2g/100 mL premix        2 g 200 mL/hr over 30 Minutes Intravenous Every 6 hours 07/27/24 1512 07/28/24 0555   07/27/24 0600  ceFAZolin  (ANCEF ) IVPB 2g/100 mL premix        2 g 200 mL/hr over 30 Minutes Intravenous On call to O.R. 07/26/24 2249 07/27/24 1200       Subjective: Seen and examined at bedside and was sitting up in the chair and feels okay.  No nausea or vomiting.  States pain is doing okay.  No other concerns or complaints at this time and denies any lightheadedness or dizziness.  Objective: Vitals:   07/28/24 1434 07/28/24 2157 07/29/24 0452 07/29/24 1328  BP: (!) 141/94 (!) 131/97 105/74 103/78  Pulse: 92 89 72 86  Resp: 19 (!) 24 (!) 24 19  Temp: 99.5 F (37.5 C) 99.5 F (37.5 C) 98.8 F (37.1 C) 98.9 F (37.2 C)  TempSrc: Oral Oral Oral Oral  SpO2: 94% 98% 95% 97%  Weight:      Height:        Intake/Output Summary (Last 24 hours) at 07/29/2024 1434 Last data filed at 07/29/2024 1331 Gross per 24 hour  Intake 1183.25 ml  Output 950 ml  Net 233.25 ml   Filed Weights   07/27/24 1040  Weight: 64.3 kg   Examination: Physical Exam:  Constitutional: WN/WD elderly chronically ill-appearing African-American female who appears calm and in no acute distress Respiratory: Diminished to auscultation bilaterally, no wheezing, rales, rhonchi or crackles. Normal respiratory effort and patient is not tachypenic. No  accessory muscle use.  Unlabored breathing Cardiovascular: RRR, no murmurs / rubs / gallops. S1 and S2 auscultated. No appreciable edema Abdomen: Soft, non-tender, non-distended. Bowel sounds positive.  GU: Deferred. Musculoskeletal: No clubbing / cyanosis of digits/nails. No joint deformity upper and lower extremities.  Skin: No rashes, lesions, ulcers on limited skin evaluation. No induration; Warm and dry.  Neurologic: CN 2-12 grossly intact with no focal deficits. Romberg sign and cerebellar reflexes not assessed.  Psychiatric: Awake and Alert and appears calm  Data Reviewed: I have personally reviewed following labs and imaging studies  CBC: Recent Labs  Lab 07/26/24 1017 07/26/24 1017 07/26/24 1727 07/27/24 0012 07/27/24 0341 07/27/24 1600 07/28/24 0354 07/29/24 0350  WBC 6.0   < > 7.6 7.7 7.5 10.8* 11.0* 10.4  NEUTROABS 3.1  --  5.7  --   --   --  8.1* 6.8  HGB 9.4*   < > 9.0* 8.3* 8.4* 11.4* 11.2* 11.0*  HCT 28.8*  --  29.0* 27.2* 27.1* 35.6* 35.4* 34.2*  MCV 85.7  --  90.1 89.2 89.1 89.2 89.4 90.0  PLT 176   < > 158 159 145* 156 141* 151   < > = values in this interval not displayed.   Basic Metabolic Panel: Recent Labs  Lab 07/26/24 1017 07/26/24 1727 07/26/24 1727 07/27/24 0012 07/27/24 0341 07/27/24 1939 07/28/24 0354 07/29/24 0350  NA 143 142  --   --  143  --  142 141  K 2.8* 2.6*  --   --  2.9*  --  3.7 3.0*  CL 107 105  --   --  107  --  105 103  CO2 28 23  --   --  22  --  23 23  GLUCOSE 91 99  --   --  104*  --  117* 108*  BUN 26* 22  --   --  22  --  17 20  CREATININE 1.99* 1.89*   < > 1.65* 1.62* 1.43* 1.45* 1.65*  CALCIUM 9.2 9.3  --   --  9.1  --  9.4 9.2  MG  --   --   --  1.8  --   --  2.1 2.1  PHOS  --   --   --   --  2.5  --  2.1* 2.7   < > = values in this interval not displayed.   GFR: Estimated Creatinine Clearance: 24.3 mL/min (A) (by C-G formula based on SCr of 1.65 mg/dL (H)). Liver Function Tests: Recent Labs  Lab  07/26/24 1017 07/28/24 0354 07/29/24 0350  AST 23 31 28   ALT 8 9 <5  ALKPHOS 55 58 55  BILITOT 0.5 0.7 0.9  PROT 8.5* 7.9 7.6  ALBUMIN 3.6 3.4* 3.2*   No results for input(s): LIPASE, AMYLASE in the last 168 hours. No results for input(s): AMMONIA in the last 168 hours. Coagulation Profile: Recent Labs  Lab 07/26/24 1759  INR 1.1   Cardiac Enzymes: No results for input(s): CKTOTAL, CKMB, CKMBINDEX, TROPONINI in the last 168 hours. BNP (last 3 results) No results for input(s): PROBNP in the last 8760 hours. HbA1C: No results for input(s): HGBA1C in the last 72 hours. CBG: No results for input(s): GLUCAP in the last 168 hours. Lipid Profile: No results for input(s): CHOL, HDL, LDLCALC, TRIG, CHOLHDL, LDLDIRECT in the last 72 hours. Thyroid  Function Tests: No results for input(s): TSH, T4TOTAL, FREET4, T3FREE, THYROIDAB in the last 72 hours. Anemia Panel: No results for input(s): VITAMINB12, FOLATE, FERRITIN, TIBC, IRON, RETICCTPCT in the last 72 hours. Sepsis Labs: No results for input(s): PROCALCITON, LATICACIDVEN in the last 168 hours.  Recent Results (from the past 240 hours)  Surgical pcr screen     Status: None   Collection Time: 07/27/24  1:26 AM   Specimen: Nasal Mucosa; Nasal Swab  Result Value Ref Range Status   MRSA, PCR NEGATIVE NEGATIVE Final   Staphylococcus aureus NEGATIVE NEGATIVE Final    Comment: (NOTE) The Xpert SA Assay (FDA approved for NASAL specimens in patients 59 years of age and older), is one component of a comprehensive surveillance program. It is not intended to diagnose infection nor to guide or monitor treatment. Performed at [facility], [address] [place], [place] [address]     Radiology Studies: No results found.  Scheduled Meds:  sodium chloride    Intravenous Once   amLODipine   10 mg Oral Daily   Chlorhexidine  Gluconate Cloth  6 each Topical  Daily   docusate sodium   100 mg Oral BID   enoxaparin  (LOVENOX ) injection  30 mg Subcutaneous Q24H   feeding supplement  237 mL Oral BID BM   ketorolac   1 drop Left Eye BID   potassium chloride   20 mEq Oral BID   prednisoLONE  acetate  1 drop Left Eye TID   senna  1 tablet Oral BID   Continuous Infusions:  sodium chloride  10 mL/hr (07/29/24 1035)    LOS: 3 days   Alejandro Marker, DO [facility] Available via Epic secure chat 7am-7pm After these hours, please refer to coverage provider listed on [email or web address] 07/29/2024, 2:34 PM

## 2024-07-29 NOTE — Progress Notes (Signed)
 Patient ID: Tami Shea, female   DOB: September 27, 1946, 78 y.o.   MRN: [id number] Subjective: 2 Days Post-Op Procedure(s) (LRB): FIXATION, FRACTURE, INTERTROCHANTERIC, WITH INTRAMEDULLARY ROD (Right)    Patient reports pain as mild.  Objective:   VITALS:   Vitals:   07/28/24 2157 07/29/24 0452  BP: (!) 131/97 105/74  Pulse: 89 72  Resp: (!) 24 (!) 24  Temp: 99.5 F (37.5 C) 98.8 F (37.1 C)  SpO2: 98% 95%    Neurovascular intact Incision: dressing C/D/I  LABS Recent Labs    07/27/24 1600 07/28/24 0354 07/29/24 0350  HGB 11.4* 11.2* 11.0*  HCT 35.6* 35.4* 34.2*  WBC 10.8* 11.0* 10.4  PLT 156 141* 151    Recent Labs    07/27/24 0341 07/27/24 1939 07/28/24 0354 07/29/24 0350  NA 143  --  142 141  K 2.9*  --  3.7 3.0*  BUN 22  --  17 20  CREATININE 1.62* 1.43* 1.45* 1.65*  GLUCOSE 104*  --  117* 108*    Recent Labs    07/26/24 1759  INR 1.1     Assessment/Plan: 2 Days Post-Op Procedure(s) (LRB): FIXATION, FRACTURE, INTERTROCHANTERIC, WITH INTRAMEDULLARY ROD (Right)   Up with therapy, reported to be weightbearing as tolerated in her right lower extremity Disposition pending based on her functional capabilities or limitations Follow-up with Dr. Fidel in 2 weeks

## 2024-07-29 NOTE — Progress Notes (Signed)
 Physical Therapy Treatment Patient Details Name: Tami Shea MRN: [id number] DOB: January 21, 1946 Today's Date: 07/29/2024   History of Present Illness 78 yo female admitted with R hip fracture after sustaining a fall. S/P IM fixation 9/19. Hx of stage IV NSCLC, CKD    PT Comments  Pt agreeable to working with therapy. Daughter present during session. Pt continues to require repeated cueing and increased time to follow 1 step commands. Mod A +2 for mobility on today. While sitting EOB, worked on forward flexion by having pt slide hands down lower legs x 3 reps. While standing in STEDY, pre-gait marching with R LE x 5 reps. Utilized STEDY for safe transfer to recliner. Will continue to follow and progress activity as safely able. Patient will benefit from continued inpatient follow up therapy, <3 hours/League     If plan is discharge home, recommend the following: Two people to help with walking and/or transfers;Two people to help with bathing/dressing/bathroom;Assistance with cooking/housework;Assist for transportation;Help with stairs or ramp for entrance   Can travel by private vehicle     No  Equipment Recommendations  None recommended by PT    Recommendations for Other Services       Precautions / Restrictions Precautions Precautions: Fall Precaution/Restrictions Comments: incontinent Restrictions Weight Bearing Restrictions Per Provider Order: No RLE Weight Bearing Per Provider Order: Weight bearing as tolerated     Mobility  Bed Mobility Overal bed mobility: Needs Assistance Bed Mobility: Supine to Sit     Supine to sit: Mod assist, +2 for physical assistance, +2 for safety/equipment, HOB elevated, Used rails     General bed mobility comments: Increased time. Max repeated cueing required. Assist for trunk and bil LEs. Utilized bedpad to assist with getting positioned at EOB.    Transfers Overall transfer level: Needs assistance Equipment used: Rolling walker (2  wheels) Transfers: Sit to/from Stand, Bed to chair/wheelchair/BSC Sit to Stand: Mod assist, +2 physical assistance, +2 safety/equipment, From elevated surface           General transfer comment: x 2 for safety, technique, tolerance. Assist to power up with facilitation at glutes to encourage full extension, stabilize, control descent. Max repeated cueing for safety, technique, posture, hand/feet placement. Used STEDY for transfer to Pharmacist, hospital: [facility]  Ambulation/Gait             Pre-gait activities: While standing in STEDY, pre-gait marching on R side x 5 reps General Gait Details: NT-pt not yet able   Stairs             Wheelchair Mobility     Tilt Bed    Modified Rankin (Stroke Patients Only)       Balance Overall balance assessment: Needs assistance, History of Falls   Sitting balance-Leahy Scale: Poor Sitting balance - Comments: Progressed to CGA for static sitting balance with time. Worked on forward flexion by having pt slide hands down lower legs x 3 reps Postural control: Posterior lean Standing balance support: During functional activity, Reliant on assistive device for balance, Bilateral upper extremity supported Standing balance-Leahy Scale: Poor                              Communication Communication Communication: No apparent difficulties  Cognition Arousal: Alert Behavior During Therapy: Anxious   PT - Cognitive impairments: Problem solving, Sequencing, Initiation, Memory, Safety/Judgement, Orientation   Orientation impairments: Situation, Time, Place  Following commands: Impaired Following commands impaired: Follows one step commands inconsistently    Cueing Cueing Techniques: Verbal cues, Gestural cues, Tactile cues, Visual cues  Exercises General Exercises - Lower Extremity Ankle Circles/Pumps: AROM, Both, 10 reps Long Arc Quad: AROM, Both, 10 reps, Seated Heel  Slides: AAROM, Right, 5 reps    General Comments        Pertinent Vitals/Pain Pain Assessment Pain Assessment: Faces Faces Pain Scale: Hurts little more Pain Location: R LE with activity Pain Descriptors / Indicators: Operative site guarding Pain Intervention(s): Limited activity within patient's tolerance, Monitored during session, Repositioned    Home Living                          Prior Function            PT Goals (current goals can now be found in the care plan section) Progress towards PT goals: Progressing toward goals    Frequency    Min 3X/week      PT Plan      Co-evaluation              AM-PAC PT 6 Clicks Mobility   Outcome Measure  Help needed turning from your back to your side while in a flat bed without using bedrails?: Total Help needed moving from lying on your back to sitting on the side of a flat bed without using bedrails?: Total Help needed moving to and from a bed to a chair (including a wheelchair)?: Total Help needed standing up from a chair using your arms (e.g., wheelchair or bedside chair)?: Total Help needed to walk in hospital room?: Total Help needed climbing 3-5 steps with a railing? : Total 6 Click Score: 6    End of Session Equipment Utilized During Treatment: Gait belt Activity Tolerance: Patient tolerated treatment well;Patient limited by pain Patient left: in chair;with call bell/phone within reach;with chair alarm set;with family/visitor present   PT Visit Diagnosis: Pain;History of falling (Z91.81);Muscle weakness (generalized) (M62.81);Difficulty in walking, not elsewhere classified (R26.2);Other abnormalities of gait and mobility (R26.89) Pain - Right/Left: Right Pain - part of body: Leg     Time: 9063-8991 PT Time Calculation (min) (ACUTE ONLY): 32 min  Charges:    $Therapeutic Exercise: 8-22 mins $Therapeutic Activity: 8-22 mins PT General Charges $$ ACUTE PT VISIT: 1 Visit                         Dannial SQUIBB, PT Acute Rehabilitation  Office: [phone]

## 2024-07-29 NOTE — Plan of Care (Signed)

## 2024-07-29 NOTE — Progress Notes (Signed)
 Updated daughter, Clemencia on patient's status for today. Thanked nurse for update

## 2024-07-29 NOTE — Plan of Care (Signed)
  Problem: Education: Goal: Knowledge of General Education information will improve Description: Including pain rating scale, medication(s)/side effects and non-pharmacologic comfort measures Outcome: Not Progressing   Problem: Nutrition: Goal: Adequate nutrition will be maintained Outcome: Not Progressing   Problem: Pain Managment: Goal: General experience of comfort will improve and/or be controlled Outcome: Progressing   Problem: Safety: Goal: Ability to remain free from injury will improve Outcome: Progressing

## 2024-07-30 ENCOUNTER — Encounter (HOSPITAL_COMMUNITY): Payer: Self-pay | Admitting: Orthopedic Surgery

## 2024-07-30 DIAGNOSIS — S72001A Fracture of unspecified part of neck of right femur, initial encounter for closed fracture: Secondary | ICD-10-CM | POA: Diagnosis not present

## 2024-07-30 LAB — BPAM RBC
Blood Product Expiration Date: 202510182359
Blood Product Expiration Date: 202510182359
Blood Product Expiration Date: 202510182359
Blood Product Expiration Date: 202510182359
ISSUE DATE / TIME: 202509191237
ISSUE DATE / TIME: 202509191237
ISSUE DATE / TIME: 202509220411
ISSUE DATE / TIME: 202509220945
Unit Type and Rh: 5100
Unit Type and Rh: 5100
Unit Type and Rh: 5100
Unit Type and Rh: 5100

## 2024-07-30 LAB — TYPE AND SCREEN
ABO/RH(D): O POS
Antibody Screen: NEGATIVE
Unit division: 0
Unit division: 0
Unit division: 0
Unit division: 0

## 2024-07-30 LAB — COMPREHENSIVE METABOLIC PANEL WITH GFR
ALT: <5 U/L (ref 0–44)
AST: 24 U/L (ref 15–41)
Albumin: 3 g/dL — ABNORMAL LOW (ref 3.5–5.0)
Alkaline Phosphatase: 49 U/L (ref 38–126)
Anion gap: 11 (ref 5–15)
BUN: 33 mg/dL — ABNORMAL HIGH (ref 8–23)
CO2: 23 mmol/L (ref 22–32)
Calcium: 9.2 mg/dL (ref 8.9–10.3)
Chloride: 106 mmol/L (ref 98–111)
Creatinine, Ser: 2.13 mg/dL — ABNORMAL HIGH (ref 0.44–1.00)
GFR, Estimated: 23 mL/min — ABNORMAL LOW (ref >60)
Glucose, Bld: 90 mg/dL (ref 70–99)
Potassium: 4.4 mmol/L (ref 3.5–5.1)
Sodium: 140 mmol/L (ref 135–145)
Total Bilirubin: 0.7 mg/dL (ref 0.0–1.2)
Total Protein: 7.1 g/dL (ref 6.5–8.1)

## 2024-07-30 LAB — CBC WITH DIFFERENTIAL/PLATELET
Abs Immature Granulocytes: 0.03 K/uL (ref 0.00–0.07)
Basophils Absolute: 0 K/uL (ref 0.0–0.1)
Basophils Relative: 0 %
Eosinophils Absolute: 0.1 K/uL (ref 0.0–0.5)
Eosinophils Relative: 1 %
HCT: 33 % — ABNORMAL LOW (ref 36.0–46.0)
Hemoglobin: 9.9 g/dL — ABNORMAL LOW (ref 12.0–15.0)
Immature Granulocytes: 0 %
Lymphocytes Relative: 26 %
Lymphs Abs: 2.4 K/uL (ref 0.7–4.0)
MCH: 27.1 pg (ref 26.0–34.0)
MCHC: 30 g/dL (ref 30.0–36.0)
MCV: 90.4 fL (ref 80.0–100.0)
Monocytes Absolute: 1.5 K/uL — ABNORMAL HIGH (ref 0.1–1.0)
Monocytes Relative: 17 %
Neutro Abs: 5.1 K/uL (ref 1.7–7.7)
Neutrophils Relative %: 56 %
Platelets: 161 K/uL (ref 150–400)
RBC: 3.65 MIL/uL — ABNORMAL LOW (ref 3.87–5.11)
RDW: 15.1 % (ref 11.5–15.5)
WBC: 9.1 K/uL (ref 4.0–10.5)
nRBC: 0 % (ref 0.0–0.2)

## 2024-07-30 LAB — MAGNESIUM: Magnesium: 2.2 mg/dL (ref 1.7–2.4)

## 2024-07-30 LAB — PHOSPHORUS: Phosphorus: 2.7 mg/dL (ref 2.5–4.6)

## 2024-07-30 NOTE — NC FL2 (Signed)
 [place]  MEDICAID FL2 LEVEL OF CARE FORM     IDENTIFICATION  Patient Name: Tami Shea Birthdate: 01-Apr-1946 Sex: female Admission Date (Current Location): 07/26/2024  [facility] and [place] Number:  Producer, television/film/video and Address:  [facility],  [address] [place], [place] [address]      Provider Number: [id number]  Attending Physician Name and Address:  Leotis Bogus, MD  Relative Name and Phone Number:  Ernest, Orr (Daughter)  [phone] [facility])    Current Level of Care: Hospital Recommended Level of Care: Skilled Nursing Facility Prior Approval Number:    Date Approved/Denied:   PASRR Number: [id number]  Discharge Plan: SNF    Current Diagnoses: Patient Active Problem List   Diagnosis Date Noted   Hip fracture (HCC) 07/26/2024   Elevated serum creatinine 07/04/2024   Pericardial effusion 07/04/2024   Anemia 04/11/2024   Encounter for antineoplastic immunotherapy 12/28/2023   Encounter for antineoplastic chemotherapy 12/28/2023   Adenocarcinoma of lower lobe of right lung (HCC) 11/01/2023   Lung mass 10/17/2023    Orientation RESPIRATION BLADDER Height & Weight     Self, Time, Situation, Place  Normal Incontinent Weight: 141 lb 12.1 oz (64.3 kg) Height:  5' 4 (162.6 cm)  BEHAVIORAL SYMPTOMS/MOOD NEUROLOGICAL BOWEL NUTRITION STATUS      Incontinent Diet (regular)  AMBULATORY STATUS COMMUNICATION OF NEEDS Skin   Limited Assist Verbally Surgical wounds (closed righ hip)                       Personal Care Assistance Level of Assistance  Bathing, Feeding, Dressing Bathing Assistance: Limited assistance Feeding assistance: Independent Dressing Assistance: Limited assistance     Functional Limitations Info  Sight, Hearing, Speech Sight Info: Adequate Hearing Info: Adequate Speech Info: Adequate    SPECIAL CARE FACTORS FREQUENCY  PT (By licensed PT), OT (By licensed OT)     PT Frequency: 5 x a week OT Frequency: 5 x a week             Contractures Contractures Info: Not present    Additional Factors Info  Code Status, Allergies Code Status Info: full Allergies Info: no known allergies           Current Medications (07/30/2024):  This is the current hospital active medication list Current Facility-Administered Medications  Medication Dose Route Frequency Provider Last Rate Last Admin   0.9 %  sodium chloride  infusion (Manually program via Guardrails IV Fluids)   Intravenous Once Swinteck, Brian, MD 10 mL/hr at 07/28/24 1535 Infusion Verify at 07/28/24 1535   0.9 %  sodium chloride  infusion  10 mL/hr Intravenous Once Good, Melanie B, CRNA 10 mL/hr at 07/29/24 1035 10 mL/hr at 07/29/24 1035   amLODipine  (NORVASC ) tablet 10 mg  10 mg Oral Daily Swinteck, Redell, MD   10 mg at 07/30/24 1006   Chlorhexidine  Gluconate Cloth 2 % PADS 6 each  6 each Topical Daily Sheikh, Omair Alma, DO   6 each at 07/30/24 1029   docusate sodium  (COLACE) capsule 100 mg  100 mg Oral BID Fidel Redell, MD   100 mg at 07/28/24 0903   enoxaparin  (LOVENOX ) injection 30 mg  30 mg Subcutaneous Q24H Swinteck, Redell, MD   30 mg at 07/30/24 0826   feeding supplement (ENSURE PLUS HIGH PROTEIN) liquid 237 mL  237 mL Oral BID BM SheikhAlejandro Latif, DO   237 mL at 07/30/24 1008   heparin  lock flush 100 unit/mL  500 Units Intracatheter Daily  PRN Heilingoetter, Cassandra L, PA-C       heparin  lock flush 100 unit/mL  250 Units Intracatheter PRN Heilingoetter, Cassandra L, PA-C       HYDROcodone -acetaminophen  (NORCO/VICODIN) 5-325 MG per tablet 1-2 tablet  1-2 tablet Oral Q6H PRN Fidel Rogue, MD   1 tablet at 07/29/24 2026   ketorolac  (ACULAR ) 0.5 % ophthalmic solution 1 drop  1 drop Left Eye BID Fidel Rogue, MD   1 drop at 07/30/24 1009   menthol  (CEPACOL) lozenge 3 mg  1 lozenge Oral PRN Swinteck, Rogue, MD       Or   phenol (CHLORASEPTIC) mouth spray 1 spray  1 spray Mouth/Throat PRN Swinteck, Rogue, MD       methocarbamol  (ROBAXIN )  tablet 500 mg  500 mg Oral Q6H PRN Swinteck, Rogue, MD       Or   methocarbamol  (ROBAXIN ) injection 500 mg  500 mg Intravenous Q6H PRN Swinteck, Rogue, MD       metoCLOPramide  (REGLAN ) tablet 5-10 mg  5-10 mg Oral Q8H PRN Swinteck, Rogue, MD       Or   metoCLOPramide  (REGLAN ) injection 5-10 mg  5-10 mg Intravenous Q8H PRN Swinteck, Rogue, MD       morphine  (PF) 2 MG/ML injection 0.5 mg  0.5 mg Intravenous Q2H PRN Swinteck, Rogue, MD       ondansetron  (ZOFRAN ) tablet 4 mg  4 mg Oral Q6H PRN Swinteck, Rogue, MD       Or   ondansetron  (ZOFRAN ) injection 4 mg  4 mg Intravenous Q6H PRN Swinteck, Rogue, MD       potassium chloride  SA (KLOR-CON  M) CR tablet 20 mEq  20 mEq Oral BID Ernie Cough, MD   20 mEq at 07/30/24 1006   prednisoLONE  acetate (PRED FORTE ) 1 % ophthalmic suspension 1 drop  1 drop Left Eye TID Swinteck, Brian, MD   1 drop at 07/30/24 1009   senna (SENOKOT) tablet 8.6 mg  1 tablet Oral BID Fidel Rogue, MD   8.6 mg at 07/28/24 0903   sodium chloride  flush (NS) 0.9 % injection 10 mL  10 mL Intracatheter PRN Heilingoetter, Cassandra L, PA-C       sodium chloride  flush (NS) 0.9 % injection 10-40 mL  10-40 mL Intracatheter PRN Sheikh, Omair Latif, DO       sodium chloride  flush (NS) 0.9 % injection 3 mL  3 mL Intracatheter PRN Heilingoetter, Cassandra L, PA-C         Discharge Medications: Please see discharge summary for a list of discharge medications.  Relevant Imaging Results:  Relevant Lab Results:   Additional Information SSN:[id number]  Tawni HERO Clydie Dillen, LCSW

## 2024-07-30 NOTE — TOC Initial Note (Signed)
 Transition of Care [facility]) - Initial/Assessment Note    Patient Details  Name: Tami Shea MRN: [id number] Date of Birth: January 29, 1946  [facility] CM/SW Contact:    Tawni CHRISTELLA Eva, LCSW Phone Number: 07/30/2024, 2:47 PM  Clinical Narrative:                  CSW spoke with the pt and the pt's son to discuss recommendations for short-term rehab placement. Pt is agreeable to receiving bed offers. CSW explained the process and the differences between home health and SNF, noting that insurance authorization will be required if the pt chooses SNF. Care management follow.   Expected Discharge Plan: Skilled Nursing Facility Barriers to Discharge: Continued Medical Work up   Patient Goals and CMS Choice Patient states their goals for this hospitalization and ongoing recovery are:: SNF to get stonger CMS Medicare.gov Compare Post Acute Care list provided to:: Patient Choice offered to / list presented to : Patient, Adult Children      Expected Discharge Plan and Services       Living arrangements for the past 2 months: Single Family Home                                      Prior Living Arrangements/Services Living arrangements for the past 2 months: Single Family Home Lives with:: Self Patient language and need for interpreter reviewed:: Yes Do you feel safe going back to the place where you live?: Yes      Need for Family Participation in Patient Care: No (Comment) Care giver support system in place?: No (comment)   Criminal Activity/Legal Involvement Pertinent to Current Situation/Hospitalization: No - Comment as needed  Activities of Daily Living   ADL Screening (condition at time of admission) Independently performs ADLs?: Yes (appropriate for developmental age) Is the patient deaf or have difficulty hearing?: No Does the patient have difficulty seeing, even when wearing glasses/contacts?: No Does the patient have difficulty concentrating,  remembering, or making decisions?: No  Permission Sought/Granted                  Emotional Assessment Appearance:: Appears stated age Attitude/Demeanor/Rapport: Gracious Affect (typically observed): Accepting Orientation: : Oriented to Place, Oriented to  Time, Oriented to Situation, Oriented to Self   Psych Involvement: No (comment)  Admission diagnosis:  Hip fracture (HCC) [S72.009A] Closed fracture of right hip, initial encounter [facility]) [S72.001A] Patient Active Problem List   Diagnosis Date Noted   Hip fracture (HCC) 07/26/2024   Elevated serum creatinine 07/04/2024   Pericardial effusion 07/04/2024   Anemia 04/11/2024   Encounter for antineoplastic immunotherapy 12/28/2023   Encounter for antineoplastic chemotherapy 12/28/2023   Adenocarcinoma of lower lobe of right lung (HCC) 11/01/2023   Lung mass 10/17/2023   PCP:  [name], MD Pharmacy:   [facility] [id number]), [place] - [address] [place] ([place]) [place] [address] Phone: [phone] Fax: [phone]     Social Drivers of Health (SDOH) Social History: SDOH Screenings   Food Insecurity: No Food Insecurity (07/26/2024)  Housing: Low Risk  (07/26/2024)  Transportation Needs: No Transportation Needs (07/26/2024)  Utilities: Not At Risk (07/26/2024)  Depression (PHQ2-9): Low Risk  (07/26/2024)  Social Connections: Moderately Integrated (07/26/2024)  Tobacco Use: Medium Risk (07/27/2024)   SDOH Interventions:     Readmission Risk Interventions     No data  to display

## 2024-07-30 NOTE — Progress Notes (Signed)
 PROGRESS NOTE    Tami Shea  FMW:[id number] DOB: [birth year] DOA: 07/26/2024 PCP: [name], MD   Brief Narrative:  This 78 y.o. female, with medical history significant of stage IV non-small cell lung cancer, adenocarcinoma, who is currently undergoing palliative systemic chemo + immunotherapy at cancer center and Patient is on treatment with Keytruda  every 3 weeks. Patient went home after treatment from cancer center and then when she stepped out of her car,  she fell on her right hip and was unable to walk. Was found to have a right intertrochanteric femur fracture and brought in and Orthopedics was consulted and took the patient for intramedullary fixation of her right femur on 9/19 and she is POD 3. PT now recommending SNF.   Assessment & Plan:   Principal Problem:   Hip fracture (HCC)  Right femur fracture:  X-ray shows osteopenia, nondisplaced intertrochanteric fracture of right hip.   Orthopedic Surgery consulted, S/P ORIF. POD # 3 WBAT RLE. Continue with current pain regimen. Bowel regimen with colace and senokot. PT/OT recommending SNF and patient will need to follow-up with Dr. Fidel in 2 weeks.   Leukocytosis: Reactive , now resolved.   Hypokalemia:  Replaced.  Resolved   Recent Cataract Surgery:  Continue ophthalmologic eyedrops with Prednisolone  Acetate , and Ketorolac  1 drop BID in Left Eye;    Non-small cell lung cancer: Continue follow up with oncology as outpatient.   Received cycle #7 of Keytruda  on 9/18.   Essential Hypertension:  Continue  Amlodipine  10 mg po Daily.    Diarrhea:  Likely in setting of chemotherapy.   Continue supportive care and Imodium for diarrhea management.   CKD Stage IV:  Creatinine at baseline.   Hypophosphatemia: Resolved.   Normocytic Anemia/Anemia of Chronic Disease: Status post 2 unit PRBC postoperatively.  Hemoglobin improved.   Thrombocytopenia: Resolved.  DVT prophylaxis: Lovenox  Code Status: Full  code Family Communication: No family at bed side Disposition Plan:    Status is: Inpatient Remains inpatient appropriate because: Medically clear, awaiting SNF    Consultants:  Orthopedics  Procedures: Antimicrobials:  Anti-infectives (From admission, onward)    Start     Dose/Rate Route Frequency Ordered Stop   07/27/24 1800  ceFAZolin  (ANCEF ) IVPB 2g/100 mL premix        2 g 200 mL/hr over 30 Minutes Intravenous Every 6 hours 07/27/24 1512 07/28/24 0555   07/27/24 0600  ceFAZolin  (ANCEF ) IVPB 2g/100 mL premix        2 g 200 mL/hr over 30 Minutes Intravenous On call to O.R. 07/26/24 2249 07/27/24 1200      Subjective: Patient was seen and examined at bedside.  Overnight events noted. Patient reports feeling better,  pain has been better with pain control.  She is awaiting SNF placement.  Objective: Vitals:   07/29/24 2031 07/30/24 0422 07/30/24 1006 07/30/24 1333  BP: (!) 114/93 123/79 116/83 114/79  Pulse: 82 79  80  Resp: 14 16  17   Temp: 100 F (37.8 C) 98.7 F (37.1 C)  98.6 F (37 C)  TempSrc: Oral Oral  Oral  SpO2: 97% 96%  95%  Weight:      Height:        Intake/Output Summary (Last 24 hours) at 07/30/2024 1405 Last data filed at 07/30/2024 0940 Gross per 24 hour  Intake 616.49 ml  Output 250 ml  Net 366.49 ml   Filed Weights   07/27/24 1040  Weight: 64.3 kg    Examination:  General  exam: Appears calm and comfortable, deconditioned, not in any acute distress. Respiratory system: Clear to auscultation. Respiratory effort normal.  RR 14 Cardiovascular system: S1 & S2 heard, RRR. No JVD, murmurs, rubs, gallops or clicks.  Gastrointestinal system: Abdomen is non distended, soft and non tender. Normal bowel sounds heard. Central nervous system: Alert and oriented x 3. No focal neurological deficits. Extremities: Status post ORIF POD #3. Skin: No rashes, lesions or ulcers Psychiatry: Judgement and insight appear normal. Mood & affect appropriate.    Data Reviewed: I have personally reviewed following labs and imaging studies  CBC: Recent Labs  Lab 07/26/24 1017 07/26/24 1727 07/27/24 0012 07/27/24 0341 07/27/24 1600 07/28/24 0354 07/29/24 0350 07/30/24 0341  WBC 6.0 7.6   < > 7.5 10.8* 11.0* 10.4 9.1  NEUTROABS 3.1 5.7  --   --   --  8.1* 6.8 5.1  HGB 9.4* 9.0*   < > 8.4* 11.4* 11.2* 11.0* 9.9*  HCT 28.8* 29.0*   < > 27.1* 35.6* 35.4* 34.2* 33.0*  MCV 85.7 90.1   < > 89.1 89.2 89.4 90.0 90.4  PLT 176 158   < > 145* 156 141* 151 161   < > = values in this interval not displayed.   Basic Metabolic Panel: Recent Labs  Lab 07/26/24 1727 07/27/24 0012 07/27/24 0341 07/27/24 1939 07/28/24 0354 07/29/24 0350 07/30/24 0341  NA 142  --  143  --  142 141 140  K 2.6*  --  2.9*  --  3.7 3.0* 4.4  CL 105  --  107  --  105 103 106  CO2 23  --  22  --  23 23 23   GLUCOSE 99  --  104*  --  117* 108* 90  BUN 22  --  22  --  17 20 33*  CREATININE 1.89* 1.65* 1.62* 1.43* 1.45* 1.65* 2.13*  CALCIUM 9.3  --  9.1  --  9.4 9.2 9.2  MG  --  1.8  --   --  2.1 2.1 2.2  PHOS  --   --  2.5  --  2.1* 2.7 2.7   GFR: Estimated Creatinine Clearance: 18.8 mL/min (A) (by C-G formula based on SCr of 2.13 mg/dL (H)). Liver Function Tests: Recent Labs  Lab 07/26/24 1017 07/28/24 0354 07/29/24 0350 07/30/24 0341  AST 23 31 28 24   ALT 8 9 <5 <5  ALKPHOS 55 58 55 49  BILITOT 0.5 0.7 0.9 0.7  PROT 8.5* 7.9 7.6 7.1  ALBUMIN 3.6 3.4* 3.2* 3.0*   No results for input(s): LIPASE, AMYLASE in the last 168 hours. No results for input(s): AMMONIA in the last 168 hours. Coagulation Profile: Recent Labs  Lab 07/26/24 1759  INR 1.1   Cardiac Enzymes: No results for input(s): CKTOTAL, CKMB, CKMBINDEX, TROPONINI in the last 168 hours. BNP (last 3 results) No results for input(s): PROBNP in the last 8760 hours. HbA1C: No results for input(s): HGBA1C in the last 72 hours. CBG: No results for input(s): GLUCAP in the last  168 hours. Lipid Profile: No results for input(s): CHOL, HDL, LDLCALC, TRIG, CHOLHDL, LDLDIRECT in the last 72 hours. Thyroid  Function Tests: No results for input(s): TSH, T4TOTAL, FREET4, T3FREE, THYROIDAB in the last 72 hours. Anemia Panel: No results for input(s): VITAMINB12, FOLATE, FERRITIN, TIBC, IRON, RETICCTPCT in the last 72 hours. Sepsis Labs: No results for input(s): PROCALCITON, LATICACIDVEN in the last 168 hours.  Recent Results (from the past 240 hours)  Surgical pcr screen  Status: None   Collection Time: 07/27/24  1:26 AM   Specimen: Nasal Mucosa; Nasal Swab  Result Value Ref Range Status   MRSA, PCR NEGATIVE NEGATIVE Final   Staphylococcus aureus NEGATIVE NEGATIVE Final    Comment: (NOTE) The Xpert SA Assay (FDA approved for NASAL specimens in patients 52 years of age and older), is one component of a comprehensive surveillance program. It is not intended to diagnose infection nor to guide or monitor treatment. Performed at [facility], [address] [place], [place] [address]     Radiology Studies: No results found.  Scheduled Meds:  sodium chloride    Intravenous Once   amLODipine   10 mg Oral Daily   Chlorhexidine  Gluconate Cloth  6 each Topical Daily   docusate sodium   100 mg Oral BID   enoxaparin  (LOVENOX ) injection  30 mg Subcutaneous Q24H   feeding supplement  237 mL Oral BID BM   ketorolac   1 drop Left Eye BID   potassium chloride   20 mEq Oral BID   prednisoLONE  acetate  1 drop Left Eye TID   senna  1 tablet Oral BID   Continuous Infusions:  sodium chloride  10 mL/hr (07/29/24 1035)     LOS: 4 days    Time spent: 50 mins    Darcel Dawley, MD [facility]   If 7PM-7AM, please contact night-coverage

## 2024-07-30 NOTE — Plan of Care (Signed)

## 2024-07-30 NOTE — Progress Notes (Addendum)
 Physical Therapy Treatment Patient Details Name: Tami Shea MRN: [id number] DOB: 1946-09-24 Today's Date: 07/30/2024   History of Present Illness 78 yo female admitted with R hip fracture after sustaining a fall. S/P IM fixation 9/19. Hx of stage IV NSCLC, CKD    PT Comments  Pt agreeable to working with therapy with some encouragement. Continues to require +2 assist for bed mobility and transfers. Pt still not quite ready to ambulate-becomes very anxious and fearful with attempts. Continues to require max cues and increased time for tasks. Pt is at risk for falls when mobilizing. Patient will benefit from continued inpatient follow up therapy, <3 hours/Haak     If plan is discharge home, recommend the following: Two people to help with walking and/or transfers;Two people to help with bathing/dressing/bathroom;Assistance with cooking/housework;Assist for transportation;Help with stairs or ramp for entrance   Can travel by private vehicle     No  Equipment Recommendations  None recommended by PT    Recommendations for Other Services       Precautions / Restrictions Precautions Precautions: Fall Precaution/Restrictions Comments: incontinent Restrictions Weight Bearing Restrictions Per Provider Order: No RLE Weight Bearing Per Provider Order: Weight bearing as tolerated     Mobility  Bed Mobility Overal bed mobility: Needs Assistance Bed Mobility: Supine to Sit     Supine to sit: Mod assist, +2 for physical assistance, +2 for safety/equipment, HOB elevated, Used rails Sit to supine: Mod assist, +2 for physical assistance, +2 for safety/equipment, HOB elevated, Used rails   General bed mobility comments: Increased time. Max repeated cueing required. Assist for trunk and bil LEs. Utilized bedpad to assist with getting positioned at EOB.    Transfers Overall transfer level: Needs assistance Equipment used: Rolling walker (2 wheels) Transfers: Sit to/from Stand Sit to Stand:  Mod assist, +2 physical assistance, +2 safety/equipment, From elevated surface           General transfer comment: Assist to power up with facilitation at glutes to encourage full extension, stabilize, control descent. Max repeated cueing for safety, technique, posture, hand/feet placement. Fall risk    Ambulation/Gait               General Gait Details: NT-pt not yet able-pt still unable to take any steps. Becomes very anxious and fearful with attempts to ambulate. Difficulty advancing R LE. Fall risk   Stairs             Wheelchair Mobility     Tilt Bed    Modified Rankin (Stroke Patients Only)       Balance Overall balance assessment: Needs assistance, History of Falls         Standing balance support: During functional activity, Reliant on assistive device for balance, Bilateral upper extremity supported Standing balance-Leahy Scale: Poor                              Communication Communication Communication: No apparent difficulties  Cognition Arousal: Alert Behavior During Therapy: Anxious   PT - Cognitive impairments: Problem solving, Sequencing, Initiation, Memory, Safety/Judgement, Orientation   Orientation impairments: Situation, Time, Place                     Following commands: Impaired Following commands impaired: Follows one step commands inconsistently    Cueing Cueing Techniques: Verbal cues, Gestural cues, Tactile cues, Visual cues  Exercises      General Comments  Pertinent Vitals/Pain Pain Assessment Pain Assessment: Faces Faces Pain Scale: Hurts little more Pain Location: R LE with activity Pain Descriptors / Indicators: Operative site guarding Pain Intervention(s): Limited activity within patient's tolerance, Monitored during session, Repositioned    Home Living                          Prior Function            PT Goals (current goals can now be found in the care plan  section) Progress towards PT goals: Progressing toward goals    Frequency    Min 3X/week      PT Plan      Co-evaluation              AM-PAC PT 6 Clicks Mobility   Outcome Measure  Help needed turning from your back to your side while in a flat bed without using bedrails?: Total Help needed moving from lying on your back to sitting on the side of a flat bed without using bedrails?: Total Help needed moving to and from a bed to a chair (including a wheelchair)?: Total Help needed standing up from a chair using your arms (e.g., wheelchair or bedside chair)?: Total Help needed to walk in hospital room?: Total Help needed climbing 3-5 steps with a railing? : Total 6 Click Score: 6    End of Session Equipment Utilized During Treatment: Gait belt Activity Tolerance: Patient tolerated treatment well;Patient limited by pain Patient left: in bed;with call bell/phone within reach;with bed alarm set   PT Visit Diagnosis: Pain;History of falling (Z91.81);Muscle weakness (generalized) (M62.81);Difficulty in walking, not elsewhere classified (R26.2);Other abnormalities of gait and mobility (R26.89) Pain - Right/Left: Right Pain - part of body: Leg     Time: 8888-8865 PT Time Calculation (min) (ACUTE ONLY): 23 min  Charges:    $Therapeutic Activity: 23-37 mins PT General Charges $$ ACUTE PT VISIT: 1 Visit                         Dannial SQUIBB, PT Acute Rehabilitation  Office: [phone]

## 2024-07-31 DIAGNOSIS — S72001A Fracture of unspecified part of neck of right femur, initial encounter for closed fracture: Secondary | ICD-10-CM | POA: Diagnosis not present

## 2024-07-31 LAB — MAGNESIUM: Magnesium: 2.1 mg/dL (ref 1.7–2.4)

## 2024-07-31 LAB — BASIC METABOLIC PANEL WITH GFR
Anion gap: 11 (ref 5–15)
BUN: 35 mg/dL — ABNORMAL HIGH (ref 8–23)
CO2: 24 mmol/L (ref 22–32)
Calcium: 9 mg/dL (ref 8.9–10.3)
Chloride: 107 mmol/L (ref 98–111)
Creatinine, Ser: 1.96 mg/dL — ABNORMAL HIGH (ref 0.44–1.00)
GFR, Estimated: 26 mL/min — ABNORMAL LOW (ref >60)
Glucose, Bld: 93 mg/dL (ref 70–99)
Potassium: 4.3 mmol/L (ref 3.5–5.1)
Sodium: 142 mmol/L (ref 135–145)

## 2024-07-31 LAB — PHOSPHORUS: Phosphorus: 2.5 mg/dL (ref 2.5–4.6)

## 2024-07-31 NOTE — Progress Notes (Signed)
 PROGRESS NOTE    Tami Shea  FMW:[id number] DOB: [birth year] DOA: 07/26/2024 PCP: [name], MD   Brief Narrative:  This 78 y.o. female, with medical history significant of stage IV non-small cell lung cancer, adenocarcinoma, who is currently undergoing palliative systemic chemo + immunotherapy at cancer center and Patient is on treatment with Keytruda  every 3 weeks. Patient went home after treatment from cancer center and then when she stepped out of her car,  she fell on her right hip and was unable to walk. Was found to have a right intertrochanteric femur fracture and brought in and Orthopedics was consulted and took the patient for intramedullary fixation of her right femur on 9/19 and she is POD 3. PT now recommending SNF.   Assessment & Plan:   Principal Problem:   Hip fracture (HCC)  Right femur fracture:  X-ray shows osteopenia, nondisplaced intertrochanteric fracture of right hip.   Orthopedic Surgery consulted, S/P ORIF. POD # 4 WBAT RLE. Continue with current pain regimen. Bowel regimen with colace and senokot. PT/OT recommending SNF and patient will need to follow-up with Dr. Fidel in 2 weeks.   Leukocytosis: Reactive , now resolved.   Hypokalemia:  Replaced.  Resolved   Recent Cataract Surgery:  Continue ophthalmologic eyedrops with Prednisolone  Acetate , and Ketorolac  1 drop BID in Left Eye;    Non-small cell lung cancer: Continue follow up with oncology as outpatient.   Received cycle #7 of Keytruda  on 9/18.   Essential Hypertension:  Continue  Amlodipine  10 mg po Daily.    Diarrhea:  Likely in setting of chemotherapy.   Continue supportive care and Imodium for diarrhea management.   CKD Stage IV:  Creatinine at baseline.   Hypophosphatemia: Resolved.   Normocytic Anemia/Anemia of Chronic Disease: Status post 2 unit PRBC postoperatively.  Hemoglobin improved.   Thrombocytopenia: Resolved.  DVT prophylaxis: Lovenox  Code Status: Full  code Family Communication: No family at bed side. Disposition Plan:    Status is: Inpatient Remains inpatient appropriate because: Medically clear, awaiting SNF    Consultants:  Orthopedics  Procedures: Antimicrobials:  Anti-infectives (From admission, onward)    Start     Dose/Rate Route Frequency Ordered Stop   07/27/24 1800  ceFAZolin  (ANCEF ) IVPB 2g/100 mL premix        2 g 200 mL/hr over 30 Minutes Intravenous Every 6 hours 07/27/24 1512 07/28/24 0555   07/27/24 0600  ceFAZolin  (ANCEF ) IVPB 2g/100 mL premix        2 g 200 mL/hr over 30 Minutes Intravenous On call to O.R. 07/26/24 2249 07/27/24 1200      Subjective: Patient was seen and examined at bedside. Overnight events noted. Patient reports feeling better,  pain has been better with pain control.   She is awaiting SNF placement.  Objective: Vitals:   07/30/24 2014 07/31/24 0411 07/31/24 0905 07/31/24 1313  BP: (!) 126/93 129/89 118/84 122/77  Pulse: 80 81  81  Resp: 19 18  16   Temp: 98.7 F (37.1 C) 98.6 F (37 C)  98.3 F (36.8 C)  TempSrc: Oral Oral  Oral  SpO2: 95% 96%  98%  Weight:      Height:        Intake/Output Summary (Last 24 hours) at 07/31/2024 1352 Last data filed at 07/31/2024 0806 Gross per 24 hour  Intake 658.49 ml  Output 450 ml  Net 208.49 ml   Filed Weights   07/27/24 1040  Weight: 64.3 kg    Examination:  General  exam: Appears calm and comfortable, deconditioned, not in any acute distress. Respiratory system: CTA Bilaterally. Respiratory effort normal.  RR 14 Cardiovascular system: S1 & S2 heard, RRR. No JVD, murmurs, rubs, gallops or clicks.  Gastrointestinal system: Abdomen is non distended, soft and non tender. Normal bowel sounds heard. Central nervous system: Alert and oriented x 3. No focal neurological deficits. Extremities: Status post ORIF POD # 4. Skin: No rashes, lesions or ulcers Psychiatry: Judgement and insight appear normal. Mood & affect appropriate.    Data Reviewed: I have personally reviewed following labs and imaging studies  CBC: Recent Labs  Lab 07/26/24 1017 07/26/24 1727 07/27/24 0012 07/27/24 0341 07/27/24 1600 07/28/24 0354 07/29/24 0350 07/30/24 0341  WBC 6.0 7.6   < > 7.5 10.8* 11.0* 10.4 9.1  NEUTROABS 3.1 5.7  --   --   --  8.1* 6.8 5.1  HGB 9.4* 9.0*   < > 8.4* 11.4* 11.2* 11.0* 9.9*  HCT 28.8* 29.0*   < > 27.1* 35.6* 35.4* 34.2* 33.0*  MCV 85.7 90.1   < > 89.1 89.2 89.4 90.0 90.4  PLT 176 158   < > 145* 156 141* 151 161   < > = values in this interval not displayed.   Basic Metabolic Panel: Recent Labs  Lab 07/27/24 0012 07/27/24 0341 07/27/24 1939 07/28/24 0354 07/29/24 0350 07/30/24 0341 07/31/24 0328  NA  --  143  --  142 141 140 142  K  --  2.9*  --  3.7 3.0* 4.4 4.3  CL  --  107  --  105 103 106 107  CO2  --  22  --  23 23 23 24   GLUCOSE  --  104*  --  117* 108* 90 93  BUN  --  22  --  17 20 33* 35*  CREATININE 1.65* 1.62* 1.43* 1.45* 1.65* 2.13* 1.96*  CALCIUM  --  9.1  --  9.4 9.2 9.2 9.0  MG 1.8  --   --  2.1 2.1 2.2 2.1  PHOS  --  2.5  --  2.1* 2.7 2.7 2.5   GFR: Estimated Creatinine Clearance: 20.4 mL/min (A) (by C-G formula based on SCr of 1.96 mg/dL (H)). Liver Function Tests: Recent Labs  Lab 07/26/24 1017 07/28/24 0354 07/29/24 0350 07/30/24 0341  AST 23 31 28 24   ALT 8 9 <5 <5  ALKPHOS 55 58 55 49  BILITOT 0.5 0.7 0.9 0.7  PROT 8.5* 7.9 7.6 7.1  ALBUMIN 3.6 3.4* 3.2* 3.0*   No results for input(s): LIPASE, AMYLASE in the last 168 hours. No results for input(s): AMMONIA in the last 168 hours. Coagulation Profile: Recent Labs  Lab 07/26/24 1759  INR 1.1   Cardiac Enzymes: No results for input(s): CKTOTAL, CKMB, CKMBINDEX, TROPONINI in the last 168 hours. BNP (last 3 results) No results for input(s): PROBNP in the last 8760 hours. HbA1C: No results for input(s): HGBA1C in the last 72 hours. CBG: No results for input(s): GLUCAP in the last 168  hours. Lipid Profile: No results for input(s): CHOL, HDL, LDLCALC, TRIG, CHOLHDL, LDLDIRECT in the last 72 hours. Thyroid  Function Tests: No results for input(s): TSH, T4TOTAL, FREET4, T3FREE, THYROIDAB in the last 72 hours. Anemia Panel: No results for input(s): VITAMINB12, FOLATE, FERRITIN, TIBC, IRON, RETICCTPCT in the last 72 hours. Sepsis Labs: No results for input(s): PROCALCITON, LATICACIDVEN in the last 168 hours.  Recent Results (from the past 240 hours)  Surgical pcr screen  Status: None   Collection Time: 07/27/24  1:26 AM   Specimen: Nasal Mucosa; Nasal Swab  Result Value Ref Range Status   MRSA, PCR NEGATIVE NEGATIVE Final   Staphylococcus aureus NEGATIVE NEGATIVE Final    Comment: (NOTE) The Xpert SA Assay (FDA approved for NASAL specimens in patients 62 years of age and older), is one component of a comprehensive surveillance program. It is not intended to diagnose infection nor to guide or monitor treatment. Performed at [facility], [address] [place], [place] [address]     Radiology Studies: No results found.  Scheduled Meds:  sodium chloride    Intravenous Once   amLODipine   10 mg Oral Daily   Chlorhexidine  Gluconate Cloth  6 each Topical Daily   docusate sodium   100 mg Oral BID   enoxaparin  (LOVENOX ) injection  30 mg Subcutaneous Q24H   feeding supplement  237 mL Oral BID BM   ketorolac   1 drop Left Eye BID   potassium chloride   20 mEq Oral BID   prednisoLONE  acetate  1 drop Left Eye TID   senna  1 tablet Oral BID   Continuous Infusions:  sodium chloride  10 mL/hr (07/30/24 1540)     LOS: 5 days    Time spent: 35 mins    Darcel Dawley, MD [facility]   If 7PM-7AM, please contact night-coverage

## 2024-07-31 NOTE — TOC Progression Note (Signed)
 Transition of Care [facility]) - Progression Note    Patient Details  Name: Tami Shea MRN: [id number] Date of Birth: Apr 12, 1946  Transition of Care [facility]) CM/SW Contact  Tawni CHRISTELLA Eva, LCSW Phone Number: 07/31/2024, 11:51 AM  Clinical Narrative:    CSW met with pt at bedside to discuss SNF placement. CSW presented bed offers for pt to review. CSW spoke with pt's son he plans to come to hospital to discuss. Care management to follow.   Expected Discharge Plan: Skilled Nursing Facility Barriers to Discharge: Continued Medical Work up               Expected Discharge Plan and Services       Living arrangements for the past 2 months: Single Family Home                                       Social Drivers of Health (SDOH) Interventions SDOH Screenings   Food Insecurity: No Food Insecurity (07/26/2024)  Housing: Low Risk  (07/26/2024)  Transportation Needs: No Transportation Needs (07/26/2024)  Utilities: Not At Risk (07/26/2024)  Depression (PHQ2-9): Low Risk  (07/26/2024)  Social Connections: Moderately Integrated (07/26/2024)  Tobacco Use: Medium Risk (07/27/2024)    Readmission Risk Interventions     No data to display

## 2024-08-01 DIAGNOSIS — D649 Anemia, unspecified: Secondary | ICD-10-CM | POA: Diagnosis not present

## 2024-08-01 DIAGNOSIS — S72001A Fracture of unspecified part of neck of right femur, initial encounter for closed fracture: Secondary | ICD-10-CM | POA: Diagnosis not present

## 2024-08-01 LAB — BASIC METABOLIC PANEL WITH GFR
Anion gap: 11 (ref 5–15)
BUN: 32 mg/dL — ABNORMAL HIGH (ref 8–23)
CO2: 23 mmol/L (ref 22–32)
Calcium: 9.2 mg/dL (ref 8.9–10.3)
Chloride: 106 mmol/L (ref 98–111)
Creatinine, Ser: 1.82 mg/dL — ABNORMAL HIGH (ref 0.44–1.00)
GFR, Estimated: 28 mL/min — ABNORMAL LOW (ref >60)
Glucose, Bld: 89 mg/dL (ref 70–99)
Potassium: 4.3 mmol/L (ref 3.5–5.1)
Sodium: 141 mmol/L (ref 135–145)

## 2024-08-01 LAB — MAGNESIUM: Magnesium: 2.1 mg/dL (ref 1.7–2.4)

## 2024-08-01 LAB — PHOSPHORUS: Phosphorus: 2.9 mg/dL (ref 2.5–4.6)

## 2024-08-01 NOTE — Progress Notes (Signed)
 Nutrition Follow-up  DOCUMENTATION CODES:   Not applicable  INTERVENTION:  - Regular diet.  - Ensure Plus High Protein po BID, each supplement provides 350 kcal and 20 grams of protein.   NUTRITION DIAGNOSIS:   Increased nutrient needs related to hip fracture as evidenced by estimated needs. *ongoing  GOAL:   Patient will meet greater than or equal to 90% of their needs *progressing  MONITOR:   PO intake, Supplement acceptance, Weight trends  REASON FOR ASSESSMENT:   Consult Hip fracture protocol  ASSESSMENT:   78 y.o. female with PMH of stage IV non-small cell lung cancer, adenocarcinoma currently undergoing palliative systemic chemo immunotherapy who presented after a fall and found to have right intertrochanteric femur fracture.  Patient reports a UBW of 150#. Not sure if she has had any recent changes in weight. Per EMR, weight mostly stable until the last 2 months, which patient has lost 9# or 6%.   Patient shares she has had a difficult time eating well at home since starting cancer treatment. Endorses significant taste changes. Notes she was recommended trying baking soda salt water  rinses and did for awhile but didn't notice any major changes. Also notes certain foods she used to like she no longer enjoys. Thankfully, she reports she will still trying to eat PTA and was also drinking Ensure.   Since admission, she reports her intake has been fair. She is documented to be consuming 0-100% of meals. However, only a few meal intakes documented in the 6 days patient has been admitted. Patient notes she doesn't really like the food here. Thankfully, she has continued to drink Ensure twice daily and endorses liking it. Didn't accept any yesterday but accepted one this AM.  Discussed increased kcal and protein needs and encouraged intake of 3 melas a Liz in addition to supplements as tolerated. Encouraged intake of outside food if able and patient states her daughter can  bring her in some food.  At this time, patient is awaiting SNF placement.   Medications reviewed and include: Colace, Senokot   Labs reviewed:  Creatinine 1.82   Diet Order:   Diet Order             Diet regular Room service appropriate? Yes; Fluid consistency: Thin  Diet effective now                   EDUCATION NEEDS:  Not appropriate for education at this time  Skin:  Skin Assessment: Skin Integrity Issues: Skin Integrity Issues:: Incisions Incisions: Right Hip  Last BM:  9/24 - type 7  Height:  Ht Readings from Last 1 Encounters:  07/27/24 5' 4 (1.626 m)   Weight:  Wt Readings from Last 1 Encounters:  07/27/24 64.3 kg    BMI:  Body mass index is 24.33 kg/m.  Estimated Nutritional Needs:  Kcal:  1800-1950 kcals Protein:  80-95 grams Fluid:  >/= 1.8L    Trude Ned RD, LDN Contact via Secure Chat.

## 2024-08-01 NOTE — Plan of Care (Signed)
   Problem: Clinical Measurements: Goal: Diagnostic test results will improve Outcome: Progressing   Problem: Activity: Goal: Risk for activity intolerance will decrease Outcome: Progressing   Problem: Nutrition: Goal: Adequate nutrition will be maintained Outcome: Progressing   Problem: Pain Managment: Goal: General experience of comfort will improve and/or be controlled Outcome: Progressing   Problem: Safety: Goal: Ability to remain free from injury will improve Outcome: Progressing

## 2024-08-01 NOTE — Progress Notes (Signed)
 [facility]                                                                              Tami Shea, is a 78 y.o. female, DOB - 1945-11-28, FMW:[id number] Admit date - 07/26/2024    Outpatient Primary MD for the patient is [name], MD  LOS - 6  days  Chief Complaint  Patient presents with   Fall       Brief summary   Patient is a 78 y.o. female, with medical history significant of stage IV non-small cell lung cancer, adenocarcinoma, who is currently undergoing palliative systemic chemo + immunotherapy at cancer center and Patient is on treatment with Keytruda  every 3 weeks. Patient went home after treatment from cancer center and then when she stepped out of her car, she fell on her right hip and was unable to walk. Was found to have a right intertrochanteric femur fracture.  Orthopedics was consulted, underwent intramedullary fixation of her right femur on 9/19. PT recommended SNF.    Assessment & Plan     Right intertrochanteric femur fracture:  X-ray shows osteopenia, nondisplaced intertrochanteric fracture of right hip.   Orthopedic Surgery consulted, underwent intramedullary fixation of the right femur on 9/19 WBAT RLE. Continue with current pain regimen. Bowel regimen with colace and senokot. PT/OT recommending SNF and patient will need to follow-up with Dr. Fidel in 2 weeks.   Leukocytosis:  -Reactive , now resolved.   Hypokalemia:  -Replace as needed   Recent Cataract Surgery:  - Continue ophthalmologic eyedrops with Prednisolone  Acetate , and Ketorolac  1 drop BID in Left Eye;    Non-small cell lung cancer: Continue follow up with oncology as outpatient.   Received cycle #7 of Keytruda  on 9/18.   Essential Hypertension:  Continue amlodipine  10 mg po Daily.     Diarrhea:  Likely in setting of chemotherapy.   Continue supportive care and Imodium for diarrhea management.   CKD Stage IV:  Creatinine at baseline.   Hypophosphatemia:   - Resolved.   Normocytic Anemia/Anemia of Chronic Disease: - Status post 2 unit PRBC postoperatively.   - H&H stable   Thrombocytopenia: Resolved.  Mild protein calorie malnutrition Nutrition Problem: Increased nutrient needs Etiology: hip fracture  Signs/Symptoms: estimated needs Interventions: Refer to RD note for recommendations, Ensure Enlive (each supplement provides 350kcal and 20 grams of protein)  Estimated body mass index is 24.33 kg/m as calculated from the following:   Height as of this encounter: 5' 4 (1.626 m).   Weight as of this encounter: 64.3 kg.  Code Status:  DVT Prophylaxis:  enoxaparin  (LOVENOX ) injection 30 mg Start: 07/28/24 0800 SCDs Start: 07/27/24 1513   Level of Care: Level of care: Telemetry Family Communication: Updated patient Disposition Plan:      Remains inpatient appropriate: Awaiting SNF   Procedures:    Consultants:   Orthopedics  Antimicrobials:   Anti-infectives (From admission, onward)    Start     Dose/Rate Route Frequency Ordered Stop   07/27/24 1800  ceFAZolin  (ANCEF ) IVPB 2g/100 mL premix        2 g 200 mL/hr over 30 Minutes  Intravenous Every 6 hours 07/27/24 1512 07/28/24 0555   07/27/24 0600  ceFAZolin  (ANCEF ) IVPB 2g/100 mL premix        2 g 200 mL/hr over 30 Minutes Intravenous On call to O.R. 07/26/24 2249 07/27/24 1200          Medications  sodium chloride    Intravenous Once   amLODipine   10 mg Oral Daily   Chlorhexidine  Gluconate Cloth  6 each Topical Daily   docusate sodium   100 mg Oral BID   enoxaparin  (LOVENOX ) injection  30 mg Subcutaneous Q24H   feeding supplement  237 mL Oral BID BM   ketorolac   1 drop Left Eye BID   potassium chloride   20 mEq Oral BID   prednisoLONE  acetate  1 drop Left Eye TID   senna  1 tablet Oral BID      Subjective:   Tami Shea was seen and examined today.  No acute issues, awaiting SNF.  Patient denies dizziness, chest pain, shortness of breath, abdominal pain,  N/V. No acute events overnight.    Objective:   Vitals:   07/31/24 1313 07/31/24 2106 08/01/24 0540 08/01/24 1136  BP: 122/77 119/84 129/88 122/82  Pulse: 81 75 76   Resp: 16 19 19    Temp: 98.3 F (36.8 C) 98 F (36.7 C) 98.1 F (36.7 C)   TempSrc: Oral Oral Oral   SpO2: 98% 98% 97%   Weight:      Height:        Intake/Output Summary (Last 24 hours) at 08/01/2024 1415 Last data filed at 08/01/2024 0600 Gross per 24 hour  Intake 814.17 ml  Output 1000 ml  Net -185.83 ml     Wt Readings from Last 3 Encounters:  07/27/24 64.3 kg  07/26/24 64.8 kg  07/04/24 66 kg     Exam General: Alert and oriented, NAD Cardiovascular: S1 S2 auscultated,  RRR Respiratory: Clear to auscultation bilaterally, no wheezing Gastrointestinal: Soft, nontender, nondistended, + bowel sounds Ext: no pedal edema bilaterally Neuro: No new deficits Psych: Normal affect     Data Reviewed:  I have personally reviewed following labs    CBC Lab Results  Component Value Date   WBC 9.1 07/30/2024   RBC 3.65 (L) 07/30/2024   HGB 9.9 (L) 07/30/2024   HCT 33.0 (L) 07/30/2024   MCV 90.4 07/30/2024   MCH 27.1 07/30/2024   PLT 161 07/30/2024   MCHC 30.0 07/30/2024   RDW 15.1 07/30/2024   LYMPHSABS 2.4 07/30/2024   MONOABS 1.5 (H) 07/30/2024   EOSABS 0.1 07/30/2024   BASOSABS 0.0 07/30/2024     Last metabolic panel Lab Results  Component Value Date   NA 141 08/01/2024   K 4.3 08/01/2024   CL 106 08/01/2024   CO2 23 08/01/2024   BUN 32 (H) 08/01/2024   CREATININE 1.82 (H) 08/01/2024   GLUCOSE 89 08/01/2024   GFRNONAA 28 (L) 08/01/2024   CALCIUM 9.2 08/01/2024   PHOS 2.9 08/01/2024   PROT 7.1 07/30/2024   ALBUMIN 3.0 (L) 07/30/2024   BILITOT 0.7 07/30/2024   ALKPHOS 49 07/30/2024   AST 24 07/30/2024   ALT <5 07/30/2024   ANIONGAP 11 08/01/2024    CBG (last 3)  No results for input(s): GLUCAP in the last 72 hours.    Coagulation Profile: Recent Labs  Lab 07/26/24 1759   INR 1.1     Radiology Studies: I have personally reviewed the imaging studies  No results found.     Tami Shea  M.D. [facility] 08/01/2024, 2:15 PM  Available via Epic secure chat 7am-7pm After 7 pm, please refer to night coverage provider listed on amion.

## 2024-08-01 NOTE — Progress Notes (Signed)
 Occupational Therapy Treatment Patient Details Name: Tami Shea MRN: [id number] DOB: Nov 14, 1945 Today's Date: 08/01/2024   History of present illness 78 yo female admitted with R hip fracture after sustaining a fall. S/P IM fixation 9/19. Hx of stage IV NSCLC, CKD   OT comments  Pt making progress with functional goals. Less anxiety, very friendly and talkative. Max A rolling L and R for staff assisted bathing, mod A bed level UB dressing, grooming/hygiene tasks. Pt required +2 mod assist for supine to sit. +2 mod assist for sit to stand pulling up in [place] lift equipment. Pt was able to stand for ~3 minutes with verbal cues to correct flexed posture. OT will continue to follow acutely to maximize level of function and safety       If plan is discharge home, recommend the following:  Two people to help with walking and/or transfers;A lot of help with bathing/dressing/bathroom;Direct supervision/assist for medications management;Supervision due to cognitive status;Direct supervision/assist for financial management;Assistance with cooking/housework;Assist for transportation;Help with stairs or ramp for entrance   Equipment Recommendations   defer   Recommendations for Other Services      Precautions / Restrictions Precautions Precautions: Fall Recall of Precautions/Restrictions: Intact Precaution/Restrictions Comments: incontinent Restrictions Weight Bearing Restrictions Per Provider Order: No RLE Weight Bearing Per Provider Order: Weight bearing as tolerated Other Position/Activity Restrictions: WBAT       Mobility Bed Mobility Overal bed mobility: Needs Assistance Bed Mobility: Supine to Sit Rolling: Max assist   Supine to sit: Mod assist, +2 for physical assistance, +2 for safety/equipment, HOB elevated, Used rails     General bed mobility comments: Increased time. Max repeated cueing required. Assist to raise trunk. Utilized bedpad to pivot hips. Pt able to advance LLE  towards EOB and required assist to advance RLE.    Transfers Overall transfer level: Needs assistance   Transfers: Sit to/from Stand, Bed to chair/wheelchair/BSC Sit to Stand: Mod assist, +2 physical assistance, +2 safety/equipment, From elevated surface           General transfer comment: assist to power up, VCs to correct flexed posture. Pt able to stand in [place] for ~3 minutes, she performed L/R weight shifting. Noted B knees flexed ~15* in standing, pt unable to correct. Transfer via Lift Equipment: Stedy   Balance Overall balance assessment: Needs assistance, History of Falls Sitting-balance support: Feet supported, Single extremity supported Sitting balance-Leahy Scale: Poor     Standing balance support: During functional activity, Reliant on assistive device for balance, Bilateral upper extremity supported Standing balance-Leahy Scale: Poor                             ADL either performed or assessed with clinical judgement   ADL Overall ADL's : Needs assistance/impaired     Grooming: Wash/dry hands;Wash/dry face;Set up;Supervision/safety;Bed level   Upper Body Bathing: Moderate assistance;Bed level   Lower Body Bathing: Total assistance;Bed level   Upper Body Dressing : Moderate assistance   Lower Body Dressing: Total assistance;Bed level   Toilet Transfer: Moderate assistance;+2 for physical assistance;+2 for safety/equipment;Cueing for safety Toilet Transfer Details (indicate cue type and reason): Stedy Toileting- Clothing Manipulation and Hygiene: Total assistance       Functional mobility during ADLs: Moderate assistance;+2 for physical assistance;+2 for safety/equipment;Cueing for safety      Extremity/Trunk Assessment Upper Extremity Assessment Upper Extremity Assessment: Generalized weakness   Lower Extremity Assessment Lower Extremity Assessment: Defer to PT evaluation  Cervical / Trunk Assessment Cervical / Trunk Assessment:  Kyphotic    Vision Baseline Vision/History: 1 Wears glasses Ability to See in Adequate Light: 0 Adequate Patient Visual Report: No change from baseline     Perception     Praxis     Communication Communication Communication: No apparent difficulties   Cognition Arousal: Alert Behavior During Therapy: WFL for tasks assessed/performed                                 Following commands: Impaired Following commands impaired: Follows one step commands inconsistently      Cueing   Cueing Techniques: Verbal cues, Gestural cues, Tactile cues, Visual cues  Exercises      Shoulder Instructions       General Comments      Pertinent Vitals/ Pain       Pain Assessment Pain Assessment: Faces Faces Pain Scale: Hurts little more Pain Location: RLE with activity Pain Descriptors / Indicators: Operative site guarding Pain Intervention(s): Monitored during session, Limited activity within patient's tolerance, Premedicated before session, Repositioned  Home Living                                          Prior Functioning/Environment              Frequency  Min 2X/week        Progress Toward Goals  OT Goals(current goals can now be found in the care plan section)  Progress towards OT goals: Progressing toward goals     Plan      Co-evaluation    PT/OT/SLP Co-Evaluation/Treatment: Yes Reason for Co-Treatment: To address functional/ADL transfers;For patient/therapist safety PT goals addressed during session: Mobility/safety with mobility;Proper use of DME;Balance;Strengthening/ROM OT goals addressed during session: ADL's and self-care;Proper use of Adaptive equipment and DME      AM-PAC OT 6 Clicks Daily Activity     Outcome Measure   Help from another person eating meals?: None Help from another person taking care of personal grooming?: A Little Help from another person toileting, which includes using toliet, bedpan, or  urinal?: Total Help from another person bathing (including washing, rinsing, drying)?: A Lot Help from another person to put on and taking off regular upper body clothing?: A Lot Help from another person to put on and taking off regular lower body clothing?: Total 6 Click Score: 13    End of Session Equipment Utilized During Treatment: Other (comment);Gait belt (Stedy)  OT Visit Diagnosis: Muscle weakness (generalized) (M62.81);History of falling (Z91.81);Pain;Other abnormalities of gait and mobility (R26.89) Pain - Right/Left: Right Pain - part of body: Hip   Activity Tolerance Patient tolerated treatment well   Patient Left with call bell/phone within reach;in chair;Other (comment) (with PT)   Nurse Communication Mobility status        Time: 9780383704 OT Time Calculation (min): 29 min  Charges: OT General Charges $OT Visit: 1 Visit OT Treatments $Self Care/Home Management : 8-22 mins   Jacques Karna Loose 08/01/2024, 2:16 PM

## 2024-08-01 NOTE — Progress Notes (Signed)
 Physical Therapy Treatment Patient Details Name: Tami Shea MRN: [id number] DOB: 08/21/1946 Today's Date: 08/01/2024   History of Present Illness 78 yo female admitted with R hip fracture after sustaining a fall. S/P IM fixation 9/19. Hx of stage IV NSCLC, CKD    PT Comments  Pt had pain medication prior to PT session. She was less anxious today and had better activity tolerance. +2 mod assist for supine to sit. +2 mod assist for sit to stand pulling up in [place] lift equipment. Pt was able to stand for ~3 minutes with verbal cues to correct flexed posture, and performed L/R weight shifts. Assisted pt with RLE exercises. Pt put forth good effort.     If plan is discharge home, recommend the following: Two people to help with walking and/or transfers;Two people to help with bathing/dressing/bathroom;Assistance with cooking/housework;Assist for transportation;Help with stairs or ramp for entrance   Can travel by private vehicle     No  Equipment Recommendations  None recommended by PT    Recommendations for Other Services       Precautions / Restrictions Precautions Precautions: Fall Recall of Precautions/Restrictions: Intact Precaution/Restrictions Comments: incontinent Restrictions Weight Bearing Restrictions Per Provider Order: No RLE Weight Bearing Per Provider Order: Weight bearing as tolerated     Mobility  Bed Mobility Overal bed mobility: Needs Assistance Bed Mobility: Supine to Sit     Supine to sit: Mod assist, +2 for physical assistance, +2 for safety/equipment, HOB elevated, Used rails     General bed mobility comments: Increased time. Max repeated cueing required. Assist to raise trunk. Utilized bedpad to pivot hips. Pt able to advance LLE towards EOB and required assist to advance RLE.    Transfers Overall transfer level: Needs assistance   Transfers: Sit to/from Stand, Bed to chair/wheelchair/BSC Sit to Stand: Mod assist, +2 physical assistance, +2  safety/equipment, From elevated surface           General transfer comment: assist to power up, VCs to correct flexed posture. Pt able to stand in [place] for ~3 minutes, she performed L/R weight shifting. Noted B knees flexed ~15* in standing, pt unable to correct. Transfer via Lift Equipment: Stedy  Ambulation/Gait                   Stairs             Wheelchair Mobility     Tilt Bed    Modified Rankin (Stroke Patients Only)       Balance Overall balance assessment: Needs assistance, History of Falls Sitting-balance support: Feet supported, Single extremity supported Sitting balance-Leahy Scale: Poor     Standing balance support: During functional activity, Reliant on assistive device for balance, Bilateral upper extremity supported Standing balance-Leahy Scale: Poor                              Communication Communication Communication: No apparent difficulties  Cognition Arousal: Alert Behavior During Therapy: WFL for tasks assessed/performed   PT - Cognitive impairments: Problem solving, Sequencing, Initiation, Memory, Safety/Judgement, Orientation   Orientation impairments: Situation, Time, Place                     Following commands: Impaired Following commands impaired: Follows one step commands inconsistently    Cueing Cueing Techniques: Verbal cues, Gestural cues, Tactile cues, Visual cues  Exercises General Exercises - Lower Extremity Ankle Circles/Pumps: AROM, Both, 10 reps Short Arc  Quad: Right, 10 reps, Supine, AAROM Heel Slides: AAROM, Right, 10 reps, Supine Hip ABduction/ADduction: AAROM, Right, 10 reps, Supine    General Comments        Pertinent Vitals/Pain Pain Assessment Faces Pain Scale: Hurts little more Pain Location: RLE with activity Pain Descriptors / Indicators: Operative site guarding Pain Intervention(s): Limited activity within patient's tolerance, Monitored during session, Premedicated  before session, Ice applied, Repositioned    Home Living                          Prior Function            PT Goals (current goals can now be found in the care plan section) Acute Rehab PT Goals Patient Stated Goal: daughter hopeful pt could return home PT Goal Formulation: With family Time For Goal Achievement: 08/11/24 Potential to Achieve Goals: Good Progress towards PT goals: Progressing toward goals    Frequency    Min 3X/week      PT Plan      Co-evaluation PT/OT/SLP Co-Evaluation/Treatment: Yes Reason for Co-Treatment: To address functional/ADL transfers PT goals addressed during session: Mobility/safety with mobility;Proper use of DME;Balance;Strengthening/ROM        AM-PAC PT 6 Clicks Mobility   Outcome Measure  Help needed turning from your back to your side while in a flat bed without using bedrails?: A Lot Help needed moving from lying on your back to sitting on the side of a flat bed without using bedrails?: Total Help needed moving to and from a bed to a chair (including a wheelchair)?: Total Help needed standing up from a chair using your arms (e.g., wheelchair or bedside chair)?: A Lot Help needed to walk in hospital room?: Total Help needed climbing 3-5 steps with a railing? : Total 6 Click Score: 8    End of Session Equipment Utilized During Treatment: Gait belt Activity Tolerance: Patient tolerated treatment well Patient left: in chair;with chair alarm set;with call bell/phone within reach Nurse Communication: Mobility status PT Visit Diagnosis: Pain;History of falling (Z91.81);Muscle weakness (generalized) (M62.81);Difficulty in walking, not elsewhere classified (R26.2);Other abnormalities of gait and mobility (R26.89) Pain - Right/Left: Right Pain - part of body: Hip     Time: 8696-8675 PT Time Calculation (min) (ACUTE ONLY): 21 min  Charges:    $Therapeutic Activity: 8-22 mins PT General Charges $$ ACUTE PT VISIT: 1  Visit                     Sylvan Delon Copp PT 08/01/2024  Acute Rehabilitation Services  Office [phone]

## 2024-08-01 NOTE — TOC Progression Note (Addendum)
 Transition of Care [facility]) - Progression Note    Patient Details  Name: Tami Shea MRN: [id number] Date of Birth: June 18, 1946  [facility] CM/SW Contact  Tami CHRISTELLA Eva, LCSW Phone Number: 08/01/2024, 1:07 PM  Clinical Narrative:     CSW spoke with pt's dauhgter Tami Shea , she reprots they are still reviwing bed offers. CSW informed pt is medialy stable and will need to get insurnaece authrization. She stated she will speak with siblling and call CSW back. Care management to follow.    [place] CSW spoke with pt's daughter they have chosen [facility]. Insurance auth pending. Care management to follow.    Expected Discharge Plan: Skilled Nursing Facility Barriers to Discharge: Continued Medical Work up               Expected Discharge Plan and Services       Living arrangements for the past 2 months: Single Family Home                                       Social Drivers of Health (SDOH) Interventions SDOH Screenings   Food Insecurity: No Food Insecurity (07/26/2024)  Housing: Low Risk  (07/26/2024)  Transportation Needs: No Transportation Needs (07/26/2024)  Utilities: Not At Risk (07/26/2024)  Depression (PHQ2-9): Low Risk  (07/26/2024)  Social Connections: Moderately Integrated (07/26/2024)  Tobacco Use: Medium Risk (07/27/2024)    Readmission Risk Interventions     No data to display

## 2024-08-02 DIAGNOSIS — S72144D Nondisplaced intertrochanteric fracture of right femur, subsequent encounter for closed fracture with routine healing: Secondary | ICD-10-CM | POA: Diagnosis not present

## 2024-08-02 DIAGNOSIS — N184 Chronic kidney disease, stage 4 (severe): Secondary | ICD-10-CM | POA: Diagnosis not present

## 2024-08-02 DIAGNOSIS — Z9289 Personal history of other medical treatment: Secondary | ICD-10-CM | POA: Diagnosis not present

## 2024-08-02 DIAGNOSIS — I129 Hypertensive chronic kidney disease with stage 1 through stage 4 chronic kidney disease, or unspecified chronic kidney disease: Secondary | ICD-10-CM | POA: Diagnosis not present

## 2024-08-02 DIAGNOSIS — R531 Weakness: Secondary | ICD-10-CM | POA: Diagnosis not present

## 2024-08-02 DIAGNOSIS — Z7401 Bed confinement status: Secondary | ICD-10-CM | POA: Diagnosis not present

## 2024-08-02 DIAGNOSIS — M8580 Other specified disorders of bone density and structure, unspecified site: Secondary | ICD-10-CM | POA: Diagnosis not present

## 2024-08-02 DIAGNOSIS — Z9849 Cataract extraction status, unspecified eye: Secondary | ICD-10-CM | POA: Diagnosis not present

## 2024-08-02 DIAGNOSIS — R1311 Dysphagia, oral phase: Secondary | ICD-10-CM | POA: Diagnosis not present

## 2024-08-02 DIAGNOSIS — D631 Anemia in chronic kidney disease: Secondary | ICD-10-CM | POA: Diagnosis not present

## 2024-08-02 DIAGNOSIS — R2681 Unsteadiness on feet: Secondary | ICD-10-CM | POA: Diagnosis not present

## 2024-08-02 DIAGNOSIS — S72001A Fracture of unspecified part of neck of right femur, initial encounter for closed fracture: Secondary | ICD-10-CM | POA: Diagnosis not present

## 2024-08-02 DIAGNOSIS — T451X5A Adverse effect of antineoplastic and immunosuppressive drugs, initial encounter: Secondary | ICD-10-CM | POA: Diagnosis not present

## 2024-08-02 DIAGNOSIS — D649 Anemia, unspecified: Secondary | ICD-10-CM | POA: Diagnosis not present

## 2024-08-02 DIAGNOSIS — Z9181 History of falling: Secondary | ICD-10-CM | POA: Diagnosis not present

## 2024-08-02 DIAGNOSIS — E441 Mild protein-calorie malnutrition: Secondary | ICD-10-CM | POA: Diagnosis not present

## 2024-08-02 DIAGNOSIS — C3431 Malignant neoplasm of lower lobe, right bronchus or lung: Secondary | ICD-10-CM | POA: Diagnosis not present

## 2024-08-02 DIAGNOSIS — K521 Toxic gastroenteritis and colitis: Secondary | ICD-10-CM | POA: Diagnosis not present

## 2024-08-02 DIAGNOSIS — Z743 Need for continuous supervision: Secondary | ICD-10-CM | POA: Diagnosis not present

## 2024-08-02 DIAGNOSIS — E876 Hypokalemia: Secondary | ICD-10-CM | POA: Diagnosis not present

## 2024-08-02 DIAGNOSIS — M6281 Muscle weakness (generalized): Secondary | ICD-10-CM | POA: Diagnosis not present

## 2024-08-02 MED ORDER — HEPARIN SOD (PORK) LOCK FLUSH 100 UNIT/ML IV SOLN
500.0000 [IU] | Freq: Once | INTRAVENOUS | Status: DC
  Filled 2024-08-02: qty 5

## 2024-08-02 MED ORDER — AMLODIPINE BESYLATE 10 MG PO TABS: 10.0000 mg | ORAL_TABLET | Freq: Every day | ORAL | Status: AC

## 2024-08-02 MED ORDER — DOCUSATE SODIUM 100 MG PO CAPS: 100.0000 mg | ORAL_CAPSULE | Freq: Two times a day (BID) | ORAL | Status: DC

## 2024-08-02 NOTE — Discharge Summary (Signed)
 Physician Discharge Summary   Patient: Tami Shea MRN: [id number] DOB: 10-31-1946  Admit date:     07/26/2024  Discharge date: 08/02/24  Discharge Physician: Nydia Distance, MD    PCP: [name], MD   Recommendations at discharge:    WBAT RLE. Continue with current pain regimen. PT/OT recommended SNF and patient will need to follow-up with Dr. Fidel in 2 weeks.  Discharge Diagnoses:  Right intertrochanteric femur fracture Recent cardiac surgery Non-small cell lung CA Hypertension Diarrhea CKD stage IV Hypophosphatemia Hypokalemia Normocytic anemia/anemia of chronic disease Thrombocytopenia Mild protein calorie malnutrition  Hospital Course:  Patient is a 78 y.o. female, with medical history significant of stage IV non-small cell lung cancer, adenocarcinoma, who is currently undergoing palliative systemic chemo + immunotherapy at cancer center and Patient is on treatment with Keytruda  every 3 weeks. Patient went home after treatment from cancer center and then when she stepped out of her car, she fell on her right hip and was unable to walk. Was found to have a right intertrochanteric femur fracture.  Orthopedics was consulted, underwent intramedullary fixation of her right femur on 9/19. PT recommended SNF.    Assessment and Plan:  Right intertrochanteric femur fracture:  X-ray shows osteopenia, nondisplaced intertrochanteric fracture of right hip.   Orthopedic Surgery consulted, underwent intramedullary fixation of the right femur on 9/19 WBAT RLE. Continue with current pain regimen, bowel regimen. PT/OT recommending SNF patient will need to follow-up with Dr. Fidel in 2 weeks.   Leukocytosis:  -Reactive , now resolved.   Hypokalemia:  -Replace as needed   Recent Cataract Surgery:  - Continue ophthalmologic eyedrops with Prednisolone  Acetate    Non-small cell lung cancer: Received cycle #7 of Keytruda  on 9/18. Outpatient follow-up with oncology, Dr.  Sherrod   Essential Hypertension:  Continue amlodipine  10 mg po Daily.     Diarrhea:  Likely in setting of chemotherapy.   Continue supportive care and Imodium for diarrhea management.   CKD Stage IV:  Creatinine at baseline.   Hypophosphatemia:  - Resolved.   Normocytic Anemia/Anemia of Chronic Disease: - Status post 2 unit PRBC postoperatively.   - H&H stable   Thrombocytopenia: Resolved.   Mild protein calorie malnutrition Nutrition Problem: Increased nutrient needs Etiology: hip fracture   Signs/Symptoms: estimated needs Interventions: Refer to RD note for recommendations, Ensure Enlive (each supplement provides 350kcal and 20 grams of protein)   Estimated body mass index is 24.33 kg/m as calculated from the following:   Height as of this encounter: 5' 4 (1.626 m).   Weight as of this encounter: 64.3 kg.       Pain control - [place]  Controlled Substance Reporting System database was reviewed. and patient was instructed, not to drive, operate heavy machinery, perform activities at heights, swimming or participation in water  activities or provide baby-sitting services while on Pain, Sleep and Anxiety Medications; until their outpatient Physician has advised to do so again. Also recommended to not to take more than prescribed Pain, Sleep and Anxiety Medications.  Consultants: Orthopedics Procedures performed: intramedullary fixation of the right femur on 9/19   Disposition: Skilled nursing facility Diet recommendation:  Discharge Diet Orders (From admission, onward)     Start     Ordered   08/02/24 0000  Diet - low sodium heart healthy        08/02/24 1054            DISCHARGE MEDICATION: Allergies as of 08/02/2024   No Known Allergies  Medication List     PAUSE taking these medications    potassium chloride  SA 20 MEQ tablet Wait to take this until your doctor or other care provider tells you to start again. Commonly known as: KLOR-CON   M Take 1 tablet (20 mEq total) by mouth 2 (two) times daily.       TAKE these medications    amLODipine  10 MG tablet Commonly known as: NORVASC  Take 1 tablet (10 mg total) by mouth daily. What changed: See the new instructions.   aspirin  81 MG chewable tablet Commonly known as: Aspirin  Childrens Chew 1 tablet (81 mg total) by mouth 2 (two) times daily with a meal.   gatifloxacin  0.5 % Soln Commonly known as: ZYMAXID  Place 1 drop into the left eye 3 (three) times daily.   HYDROcodone -acetaminophen  5-325 MG tablet Commonly known as: NORCO/VICODIN Take 1 tablet by mouth every 4 (four) hours as needed for up to 7 days for moderate pain (pain score 4-6) or severe pain (pain score 7-10).   ketorolac  0.5 % ophthalmic solution Commonly known as: ACULAR  Place 1 drop into the left eye 2 (two) times daily.   lidocaine -prilocaine  cream Commonly known as: EMLA  Apply to affected area once What changed:  how much to take how to take this when to take this reasons to take this   loperamide 2 MG capsule Commonly known as: IMODIUM Take 2 mg by mouth as needed for diarrhea or loose stools (pt has been taking daily).   ondansetron  8 MG tablet Commonly known as: Zofran  Take 1 tablet (8 mg total) by mouth every 8 (eight) hours as needed for nausea or vomiting. Start on the third [place] after carboplatin .   prednisoLONE  acetate 1 % ophthalmic suspension Commonly known as: PRED FORTE  Place 1 drop into the left eye 3 (three) times daily.   prochlorperazine  10 MG tablet Commonly known as: COMPAZINE  Take 1 tablet (10 mg total) by mouth every 6 (six) hours as needed for nausea or vomiting.   Vitamin D3 1.25 MG (50000 UT) Caps Take 1 capsule by mouth once a week.               Discharge Care Instructions  (From admission, onward)           Start     Ordered   08/02/24 0000  If the dressing is still on your incision site when you go home, remove it on the third [place] after your  surgery date. Remove dressing if it begins to fall off, or if it is dirty or damaged before the third [place].        08/02/24 1054            Follow-up Information     Leigh Valery RAMAN, PA-C. Schedule an appointment as soon as possible for a visit in 2 week(s).   Specialty: Orthopedic Surgery Why: For suture removal, For wound re-check Contact information: [address] [place] [address] [phone]         [name], MD. Schedule an appointment as soon as possible for a visit in 2 week(s).   Specialty: Family Medicine Why: for hospital follow-up Contact information: [address] [place] [place] [address] [phone]                Discharge Exam: Filed Weights   07/27/24 1040  Weight: 64.3 kg   S: No acute complaints, feeling better today, cleared for discharge to SNF  BP (!) 132/92  Pulse 78   Temp 98.5 F (36.9 C) (Oral)   Resp 17   Ht 5' 4 (1.626 m)   Wt 64.3 kg   SpO2 97%   BMI 24.33 kg/m   Physical Exam General: Alert and oriented, NAD Cardiovascular: S1 S2 clear, RRR.  Respiratory: CTAB, no wheezing Gastrointestinal: Soft, nontender, nondistended, NBS Ext: no pedal edema bilaterally Neuro: no new deficits Psych: Normal affect    Condition at discharge: fair  The results of significant diagnostics from this hospitalization (including imaging, microbiology, ancillary and laboratory) are listed below for reference.   Imaging Studies: DG HIP UNILAT WITH PELVIS 1V RIGHT Result Date: 07/27/2024 EXAM: 1 VIEW XRAY OF THE RIGHT HIP 07/27/2024 01:04:00 PM COMPARISON: Radiographs 07/26/2024 CLINICAL HISTORY: 886218 Surgery, elective 886218. Right Im nail; FINDINGS: Two fluoroscopic spot views of the right hip submitted from the operating room. Femoral intramedullary nail with transtrochanteric and distal locking screw fixation traverses proximal femur fracture. FT: 49 sec FD: 5.8463 mGy IMPRESSION: 1. Procedure fluoroscopy during  proximal femur fracture fixation. Electronically signed by: Andrea Gasman MD 07/27/2024 03:07 PM EDT RP Workstation: [id number]   DG C-Arm 1-60 Min-No Report Result Date: 07/27/2024 Fluoroscopy was utilized by the requesting physician.  No radiographic interpretation.   DG C-Arm 1-60 Min-No Report Result Date: 07/27/2024 Fluoroscopy was utilized by the requesting physician.  No radiographic interpretation.   DG Knee Right Port Result Date: 07/26/2024 CLINICAL DATA:  Fall and trauma to the right knee. EXAM: PORTABLE RIGHT KNEE - 1-2 VIEW COMPARISON:  None Available. FINDINGS: There is no acute fracture or dislocation. The bones are osteopenic. Moderate arthritic changes of the knee. No significant effusion. The soft tissues are unremarkable. IMPRESSION: 1. No acute fracture or dislocation. 2. Moderate arthritic changes. Electronically Signed   By: Vanetta Chou M.D.   On: 07/26/2024 18:53   DG Hip Unilat W or Wo Pelvis 2-3 Views Right Result Date: 07/26/2024 CLINICAL DATA:  fall EXAM: DG HIP (WITH OR WITHOUT PELVIS) 2-3V RIGHT COMPARISON:  June 28, 2024 FINDINGS: Osteopenia.No evidence of pelvic fracture or diastasis.Nondisplaced intertrochanteric fracture of the right hip. No dislocation.Multilevel degenerative disc disease of the spine. Mild osteoarthritis of both hips. Peripheral vascular atherosclerosis. IMPRESSION: Osteopenia. Nondisplaced, intertrochanteric fracture of the right hip. Electronically Signed   By: Rogelia Myers M.D.   On: 07/26/2024 17:24   DG Chest 1 View Result Date: 07/26/2024 CLINICAL DATA:  fall EXAM: CHEST  1 VIEW COMPARISON:  June 28, 2024 FINDINGS: Lower lung volumes. Bilateral perihilar interstitial opacities. Moderate cardiomegaly with superimposed large pericardial effusion again noted. Right chest port in place terminating at the cavoatrial junction. Tortuous aorta with aortic atherosclerosis. No acute fracture or destructive lesions. Multilevel thoracic  osteophytosis. IMPRESSION: 1. Bilateral perihilar interstitial opacities,, which may represent bronchovascular crowding due to low lung volumes, interstitial edema, or atypical/viral infection, in the correct clinical context. 2. Moderate cardiomegaly and superimposed pericardial effusion, unchanged. Electronically Signed   By: Rogelia Myers M.D.   On: 07/26/2024 17:22    Microbiology: Results for orders placed or performed during the hospital encounter of 07/26/24  Surgical pcr screen     Status: None   Collection Time: 07/27/24  1:26 AM   Specimen: Nasal Mucosa; Nasal Swab  Result Value Ref Range Status   MRSA, PCR NEGATIVE NEGATIVE Final   Staphylococcus aureus NEGATIVE NEGATIVE Final    Comment: (NOTE) The Xpert SA Assay (FDA approved for NASAL specimens in patients 2 years of age and older), is one component  of a comprehensive surveillance program. It is not intended to diagnose infection nor to guide or monitor treatment. Performed at [facility], [address], [place], [place] [address]     Labs: CBC: Recent Labs  Lab 07/26/24 1727 07/27/24 0012 07/27/24 0341 07/27/24 1600 07/28/24 0354 07/29/24 0350 07/30/24 0341  WBC 7.6   < > 7.5 10.8* 11.0* 10.4 9.1  NEUTROABS 5.7  --   --   --  8.1* 6.8 5.1  HGB 9.0*   < > 8.4* 11.4* 11.2* 11.0* 9.9*  HCT 29.0*   < > 27.1* 35.6* 35.4* 34.2* 33.0*  MCV 90.1   < > 89.1 89.2 89.4 90.0 90.4  PLT 158   < > 145* 156 141* 151 161   < > = values in this interval not displayed.   Basic Metabolic Panel: Recent Labs  Lab 07/28/24 0354 07/29/24 0350 07/30/24 0341 07/31/24 0328 08/01/24 0421  NA 142 141 140 142 141  K 3.7 3.0* 4.4 4.3 4.3  CL 105 103 106 107 106  CO2 23 23 23 24 23   GLUCOSE 117* 108* 90 93 89  BUN 17 20 33* 35* 32*  CREATININE 1.45* 1.65* 2.13* 1.96* 1.82*  CALCIUM 9.4 9.2 9.2 9.0 9.2  MG 2.1 2.1 2.2 2.1 2.1  PHOS 2.1* 2.7 2.7 2.5 2.9   Liver Function Tests: Recent Labs  Lab  07/28/24 0354 07/29/24 0350 07/30/24 0341  AST 31 28 24   ALT 9 <5 <5  ALKPHOS 58 55 49  BILITOT 0.7 0.9 0.7  PROT 7.9 7.6 7.1  ALBUMIN 3.4* 3.2* 3.0*   CBG: No results for input(s): GLUCAP in the last 168 hours.  Discharge time spent: greater than 30 minutes.  Signed: Nydia Distance, MD [facility] 08/02/2024

## 2024-08-02 NOTE — Plan of Care (Signed)
  Problem: Education: Goal: Knowledge of General Education information will improve Description: Including pain rating scale, medication(s)/side effects and non-pharmacologic comfort measures Outcome: Adequate for Discharge   Problem: Health Behavior/Discharge Planning: Goal: Ability to manage health-related needs will improve Outcome: Adequate for Discharge   Problem: Clinical Measurements: Goal: Ability to maintain clinical measurements within normal limits will improve Outcome: Adequate for Discharge Goal: Will remain free from infection Outcome: Adequate for Discharge Goal: Diagnostic test results will improve Outcome: Adequate for Discharge Goal: Respiratory complications will improve Outcome: Adequate for Discharge Goal: Cardiovascular complication will be avoided Outcome: Adequate for Discharge   Problem: Activity: Goal: Risk for activity intolerance will decrease Outcome: Adequate for Discharge   Problem: Nutrition: Goal: Adequate nutrition will be maintained Outcome: Adequate for Discharge   Problem: Coping: Goal: Level of anxiety will decrease Outcome: Adequate for Discharge   Problem: Elimination: Goal: Will not experience complications related to bowel motility Outcome: Adequate for Discharge Goal: Will not experience complications related to urinary retention Outcome: Adequate for Discharge   Problem: Pain Managment: Goal: General experience of comfort will improve and/or be controlled Outcome: Adequate for Discharge   Problem: Safety: Goal: Ability to remain free from injury will improve Outcome: Adequate for Discharge   Problem: Skin Integrity: Goal: Risk for impaired skin integrity will decrease Outcome: Adequate for Discharge   Problem: Education: Goal: Verbalization of understanding the information provided (i.e., activity precautions, restrictions, etc) will improve Outcome: Adequate for Discharge Goal: Individualized Educational  Video(s) Outcome: Adequate for Discharge   Problem: Activity: Goal: Ability to ambulate and perform ADLs will improve Outcome: Adequate for Discharge   Problem: Clinical Measurements: Goal: Postoperative complications will be avoided or minimized Outcome: Adequate for Discharge   Problem: Self-Concept: Goal: Ability to maintain and perform role responsibilities to the fullest extent possible will improve Outcome: Adequate for Discharge   Problem: Pain Management: Goal: Pain level will decrease Outcome: Adequate for Discharge

## 2024-08-02 NOTE — Progress Notes (Signed)
 Attempted to call [facility] to give report at 12:35 PM , but was asked to call back in approximately 10 minutes to give report.   Leonor FORBES Clarks, BSN, RN  08/02/24 12:40 PM

## 2024-08-02 NOTE — Progress Notes (Signed)
 IV team to bedside to deacess port for discharge. Pt refusing despite multiple explanations, phone calls with family, showing patient orders in computer for port to be deacessed, and primary RN to bedside. To return when available.

## 2024-08-02 NOTE — TOC Transition Note (Signed)
 Transition of Care [facility]) - Discharge Note   Patient Details  Name: Tami Shea MRN: [id number] Date of Birth: 11/16/1945  Transition of Care [facility]) CM/SW Contact:  Tawni CHRISTELLA Eva, LCSW Phone Number: 08/02/2024, 11:16 AM   Clinical Narrative:     Pt's insurance shara was approved for [employer]. Pt's room 704P RN to call report to [phone]. Csw spoke with pt's daughter , she agrees with d/c plan. PTAR called, IPCM sign off.    Final next level of care: Skilled Nursing Facility Barriers to Discharge: Barriers Resolved   Patient Goals and CMS Choice Patient states their goals for this hospitalization and ongoing recovery are:: SNF to get stonger CMS Medicare.gov Compare Post Acute Care list provided to:: Patient Choice offered to / list presented to : Patient, Adult Children      Discharge Placement                    Patient and family notified of of transfer: 08/02/24  Discharge Plan and Services Additional resources added to the After Visit Summary for                                       Social Drivers of Health (SDOH) Interventions SDOH Screenings   Food Insecurity: No Food Insecurity (07/26/2024)  Housing: Low Risk  (07/26/2024)  Transportation Needs: No Transportation Needs (07/26/2024)  Utilities: Not At Risk (07/26/2024)  Depression (PHQ2-9): Low Risk  (07/26/2024)  Social Connections: Moderately Integrated (07/26/2024)  Tobacco Use: Medium Risk (07/27/2024)     Readmission Risk Interventions     No data to display

## 2024-08-02 NOTE — TOC Transition Note (Signed)
 Transition of Care [facility]) - Discharge Note   Patient Details  Name: Tami Shea MRN: [id number] Date of Birth: [birth year]  Transition of Care [facility]) CM/SW Contact:  Tawni CHRISTELLA Eva, LCSW Phone Number: 08/02/2024, 12:34 PM   Clinical Narrative:    Pt to d[facility]c [facility]. Pt's room number 704, RN to call report [phone]. PTAR called . Care management sign off.    Final next level of care: Skilled Nursing Facility Barriers to Discharge: Barriers Resolved   Patient Goals and CMS Choice Patient states their goals for this hospitalization and ongoing recovery are:: SNF to get stonger CMS Medicare.gov Compare Post Acute Care list provided to:: Patient Choice offered to / list presented to : Patient, Adult Children      Discharge Placement                    Patient and family notified of of transfer: 08/02/24  Discharge Plan and Services Additional resources added to the After Visit Summary for                                       Social Drivers of Health (SDOH) Interventions SDOH Screenings   Food Insecurity: No Food Insecurity (07/26/2024)  Housing: Low Risk  (07/26/2024)  Transportation Needs: No Transportation Needs (07/26/2024)  Utilities: Not At Risk (07/26/2024)  Depression (PHQ2-9): Low Risk  (07/26/2024)  Social Connections: Moderately Integrated (07/26/2024)  Tobacco Use: Medium Risk (07/27/2024)     Readmission Risk Interventions     No data to display

## 2024-08-03 ENCOUNTER — Telehealth: Payer: Self-pay | Admitting: Medical Oncology

## 2024-08-03 NOTE — Telephone Encounter (Signed)
 Tami Shea stated pt is residing at [facility] for 20 days ( s/p hip fx).Asking about transportation to next appts. LVM at [facility] 'scheduler-Jessica  with pts next appts and need for transportation. I asked for a return call.   Tami Shea informed of above. I asked her to call [facility] to verify transportation.

## 2024-08-06 DIAGNOSIS — Z9181 History of falling: Secondary | ICD-10-CM | POA: Diagnosis not present

## 2024-08-06 DIAGNOSIS — Z9289 Personal history of other medical treatment: Secondary | ICD-10-CM | POA: Diagnosis not present

## 2024-08-06 DIAGNOSIS — E441 Mild protein-calorie malnutrition: Secondary | ICD-10-CM | POA: Diagnosis not present

## 2024-08-06 DIAGNOSIS — E876 Hypokalemia: Secondary | ICD-10-CM | POA: Diagnosis not present

## 2024-08-06 DIAGNOSIS — N184 Chronic kidney disease, stage 4 (severe): Secondary | ICD-10-CM | POA: Diagnosis not present

## 2024-08-06 DIAGNOSIS — M8580 Other specified disorders of bone density and structure, unspecified site: Secondary | ICD-10-CM | POA: Diagnosis not present

## 2024-08-06 DIAGNOSIS — S72144D Nondisplaced intertrochanteric fracture of right femur, subsequent encounter for closed fracture with routine healing: Secondary | ICD-10-CM | POA: Diagnosis not present

## 2024-08-06 DIAGNOSIS — T451X5A Adverse effect of antineoplastic and immunosuppressive drugs, initial encounter: Secondary | ICD-10-CM | POA: Diagnosis not present

## 2024-08-06 DIAGNOSIS — K521 Toxic gastroenteritis and colitis: Secondary | ICD-10-CM | POA: Diagnosis not present

## 2024-08-06 DIAGNOSIS — C3431 Malignant neoplasm of lower lobe, right bronchus or lung: Secondary | ICD-10-CM | POA: Diagnosis not present

## 2024-08-06 DIAGNOSIS — Z9849 Cataract extraction status, unspecified eye: Secondary | ICD-10-CM | POA: Diagnosis not present

## 2024-08-06 DIAGNOSIS — I129 Hypertensive chronic kidney disease with stage 1 through stage 4 chronic kidney disease, or unspecified chronic kidney disease: Secondary | ICD-10-CM | POA: Diagnosis not present

## 2024-08-06 DIAGNOSIS — R2681 Unsteadiness on feet: Secondary | ICD-10-CM | POA: Diagnosis not present

## 2024-08-06 DIAGNOSIS — D631 Anemia in chronic kidney disease: Secondary | ICD-10-CM | POA: Diagnosis not present

## 2024-08-08 ENCOUNTER — Other Ambulatory Visit: Payer: Self-pay

## 2024-08-08 DIAGNOSIS — R2681 Unsteadiness on feet: Secondary | ICD-10-CM | POA: Diagnosis not present

## 2024-08-08 DIAGNOSIS — S72144D Nondisplaced intertrochanteric fracture of right femur, subsequent encounter for closed fracture with routine healing: Secondary | ICD-10-CM | POA: Diagnosis not present

## 2024-08-08 DIAGNOSIS — C3431 Malignant neoplasm of lower lobe, right bronchus or lung: Secondary | ICD-10-CM | POA: Diagnosis not present

## 2024-08-08 DIAGNOSIS — Z9181 History of falling: Secondary | ICD-10-CM | POA: Diagnosis not present

## 2024-08-10 DIAGNOSIS — Z7189 Other specified counseling: Secondary | ICD-10-CM | POA: Diagnosis not present

## 2024-08-10 DIAGNOSIS — N184 Chronic kidney disease, stage 4 (severe): Secondary | ICD-10-CM | POA: Diagnosis not present

## 2024-08-10 DIAGNOSIS — C3431 Malignant neoplasm of lower lobe, right bronchus or lung: Secondary | ICD-10-CM | POA: Diagnosis not present

## 2024-08-10 DIAGNOSIS — S72144D Nondisplaced intertrochanteric fracture of right femur, subsequent encounter for closed fracture with routine healing: Secondary | ICD-10-CM | POA: Diagnosis not present

## 2024-08-10 DIAGNOSIS — W19XXXA Unspecified fall, initial encounter: Secondary | ICD-10-CM | POA: Diagnosis not present

## 2024-08-10 DIAGNOSIS — Z9289 Personal history of other medical treatment: Secondary | ICD-10-CM | POA: Diagnosis not present

## 2024-08-10 DIAGNOSIS — I129 Hypertensive chronic kidney disease with stage 1 through stage 4 chronic kidney disease, or unspecified chronic kidney disease: Secondary | ICD-10-CM | POA: Diagnosis not present

## 2024-08-10 DIAGNOSIS — D631 Anemia in chronic kidney disease: Secondary | ICD-10-CM | POA: Diagnosis not present

## 2024-08-11 NOTE — Progress Notes (Unsigned)
 [facility] OFFICE PROGRESS NOTE  Tami Aland, MD [address] [place] [address]  DIAGNOSIS: Stage IV (T4, N2, M1 B) non-small cell lung cancer, adenocarcinoma presented with large right infrahilar mass in addition to right hilar and mediastinal lymphadenopathy as well as left adrenal gland metastasis diagnosed in December 2024.    Biomarker Findings Tumor Mutational Burden - 24 Muts/Mb HRD signature - HRDsig Negative Microsatellite status - MS-Stable Genomic Findings For a complete list of the genes assayed, please refer to the Appendix. BRAF G464V KRAS G13C ASXL1 deletion exon 12 TP53 splice site 920-2A>T 6 Disease relevant genes with no reportable alterations: ALK, EGFR, ERBB2, MET, RET, ROS1   PDL1 Expression: 90%  PRIOR THERAPY: None  CURRENT THERAPY: Systemic treatment with combination of chemoimmunotherapy with carboplatin  for AUC of 5, Alimta  500 Mg/M2 and Keytruda  200 Mg IV every 3 weeks.  First dose December 08, 2023.  Status post 11 cycles.  Starting from cycle #3 her dose of carboplatin  will be for AUC of 4 and Alimta  400 Mg/M2 secondary to pancytopenia.  Starting cycle #5 she was on maintenance treatment with Alimta  and Keytruda  every 3 weeks.  Starting from cycle #6 she will be on single agent Keytruda  every 3 weeks.  Alimta  discontinued secondary to toxicity.   INTERVAL HISTORY: Tami Shea 78 y.o. female returns to the clinic today for a follow-up visit. Her daughter is available by phone.  In the interval since last being seen. She fell and experienced hip fracture requiring surgery. She had this performed on 07/27/24. She is currently in *** SNF. She is expected to return home on ***.   ***I previously referred to nephrology   She is currently on single agent immunotherapy with Keytruda .  She tolerates this fairly well.  Tami Shea    Her Alimta  was discontinued due to worsening CKD and significant anemia requiring blood transfusions.  She  has been tolerating single agent immunotherapy better.  The patient does not have a nephrologist.  She also does not have a cardiologist as her scans have been showing stable pericardial effusion.  She does not have any significant lower extremity swelling.  She has hypertension.  She forgot to take her antihypertensive today.   She denies any fever, chills, night sweats, or unexplained weight loss.  She denies any significant shortness of breath with exertion, cough, chest pain, or hemoptysis.  She denies any nausea, vomiting, diarrhea, or constipation.  Denies any rashes or skin changes except she continues to have the area of hyperpigmentation on her left lower extremity which may be from her prior treatment with Alimta .   She is here today for evaluation and to review her scan results before undergoing her next cycle of treatment.   She is here today for evaluation and to review her scan before undergoing cycle #12.    MEDICAL HISTORY: Past Medical History:  Diagnosis Date   Dyspnea    History of kidney stones    HTN (hypertension)    Smoker     ALLERGIES:  has no known allergies.  MEDICATIONS:  Current Outpatient Medications  Medication Sig Dispense Refill   amLODipine  (NORVASC ) 10 MG tablet Take 1 tablet (10 mg total) by mouth daily.     aspirin  (ASPIRIN  CHILDRENS) 81 MG chewable tablet Chew 1 tablet (81 mg total) by mouth 2 (two) times daily with a meal. 90 tablet 0   Cholecalciferol (VITAMIN D3) 1.25 MG (50000 UT) CAPS Take 1 capsule by mouth  once a week.     gatifloxacin  (ZYMAXID ) 0.5 % SOLN Place 1 drop into the left eye 3 (three) times daily.     ketorolac  (ACULAR ) 0.5 % ophthalmic solution Place 1 drop into the left eye 2 (two) times daily.     lidocaine -prilocaine  (EMLA ) cream Apply to affected area once (Patient taking differently: Apply 1 Application topically as needed [facility] access). Apply to affected area once) 30 g 3   loperamide (IMODIUM) 2 MG capsule Take 2 mg by mouth  as needed for diarrhea or loose stools (pt has been taking daily).     ondansetron  (ZOFRAN ) 8 MG tablet Take 1 tablet (8 mg total) by mouth every 8 (eight) hours as needed for nausea or vomiting. Start on the third Sorrels after carboplatin . (Patient not taking: Reported on 03/08/2024) 30 tablet 1   [Paused] potassium chloride  SA (KLOR-CON  M) 20 MEQ tablet Take 1 tablet (20 mEq total) by mouth 2 (two) times daily. 14 tablet 0   prednisoLONE  acetate (PRED FORTE ) 1 % ophthalmic suspension Place 1 drop into the left eye 3 (three) times daily.     prochlorperazine  (COMPAZINE ) 10 MG tablet Take 1 tablet (10 mg total) by mouth every 6 (six) hours as needed for nausea or vomiting. 30 tablet 1   No current facility-administered medications for this visit.    SURGICAL HISTORY:  Past Surgical History:  Procedure Laterality Date   BRONCHIAL NEEDLE ASPIRATION BIOPSY  10/24/2023   Procedure: BRONCHIAL NEEDLE ASPIRATION BIOPSIES;  Surgeon: Brenna Adine CROME, DO;  Location: [facility];  Service: Pulmonary;;   COLONOSCOPY     INTRAMEDULLARY (IM) NAIL INTERTROCHANTERIC Right 07/27/2024   Procedure: FIXATION, FRACTURE, INTERTROCHANTERIC, WITH INTRAMEDULLARY ROD;  Surgeon: Fidel Rogue, MD;  Location: [facility];  Service: Orthopedics;  Laterality: Right;   IR IMAGING GUIDED PORT INSERTION  12/09/2023   PILONIDAL CYST EXCISION     tonisllectomy     VIDEO BRONCHOSCOPY WITH ENDOBRONCHIAL ULTRASOUND Right 10/24/2023   Procedure: VIDEO BRONCHOSCOPY WITH ENDOBRONCHIAL ULTRASOUND;  Surgeon: Brenna Adine CROME, DO;  Location: [facility];  Service: Pulmonary;  Laterality: Right;    REVIEW OF SYSTEMS:   Review of Systems  Constitutional: Negative for appetite change, chills, fatigue, fever and unexpected weight change.  HENT:   Negative for mouth sores, nosebleeds, sore throat and trouble swallowing.   Eyes: Negative for eye problems and icterus.  Respiratory: Negative for cough, hemoptysis, shortness of breath and  wheezing.   Cardiovascular: Negative for chest pain and leg swelling.  Gastrointestinal: Negative for abdominal pain, constipation, diarrhea, nausea and vomiting.  Genitourinary: Negative for bladder incontinence, difficulty urinating, dysuria, frequency and hematuria.   Musculoskeletal: Negative for back pain, gait problem, neck pain and neck stiffness.  Skin: Negative for itching and rash.  Neurological: Negative for dizziness, extremity weakness, gait problem, headaches, light-headedness and seizures.  Hematological: Negative for adenopathy. Does not bruise/bleed easily.  Psychiatric/Behavioral: Negative for confusion, depression and sleep disturbance. The patient is not nervous/anxious.     PHYSICAL EXAMINATION:  There were no vitals taken for this visit.  ECOG PERFORMANCE STATUS: {CHL ONC ECOG [id number]  Physical Exam  Constitutional: Oriented to person, place, and time and well-developed, well-nourished, and in no distress. No distress.  HENT:  Head: Normocephalic and atraumatic.  Mouth/Throat: Oropharynx is clear and moist. No oropharyngeal exudate.  Eyes: Conjunctivae are normal. Right eye exhibits no discharge. Left eye exhibits no discharge. No scleral icterus.  Neck: Normal range of motion. Neck supple.  Cardiovascular: Normal rate,  regular rhythm, normal heart sounds and intact distal pulses.   Pulmonary/Chest: Effort normal and breath sounds normal. No respiratory distress. No wheezes. No rales.  Abdominal: Soft. Bowel sounds are normal. Exhibits no distension and no mass. There is no tenderness.  Musculoskeletal: Normal range of motion. Exhibits no edema.  Lymphadenopathy:    No cervical adenopathy.  Neurological: Alert and oriented to person, place, and time. Exhibits normal muscle tone. Gait normal. Coordination normal.  Skin: Skin is warm and dry. No rash noted. Not diaphoretic. No erythema. No pallor.  Psychiatric: Mood, memory and judgment normal.  Vitals  reviewed.  LABORATORY DATA: Lab Results  Component Value Date   WBC 9.1 07/30/2024   HGB 9.9 (L) 07/30/2024   HCT 33.0 (L) 07/30/2024   MCV 90.4 07/30/2024   PLT 161 07/30/2024      Chemistry      Component Value Date/Time   NA 141 08/01/2024 0421   NA 142 10/12/2021 1043   K 4.3 08/01/2024 0421   CL 106 08/01/2024 0421   CO2 23 08/01/2024 0421   BUN 32 (H) 08/01/2024 0421   BUN 17 10/12/2021 1043   CREATININE 1.82 (H) 08/01/2024 0421   CREATININE 1.99 (H) 07/26/2024 1017      Component Value Date/Time   CALCIUM 9.2 08/01/2024 0421   ALKPHOS 49 07/30/2024 0341   AST 24 07/30/2024 0341   AST 23 07/26/2024 1017   ALT <5 07/30/2024 0341   ALT 8 07/26/2024 1017   BILITOT 0.7 07/30/2024 0341   BILITOT 0.5 07/26/2024 1017       RADIOGRAPHIC STUDIES:  DG HIP UNILAT WITH PELVIS 1V RIGHT Result Date: 07/27/2024 EXAM: 1 VIEW XRAY OF THE RIGHT HIP 07/27/2024 01:04:00 PM COMPARISON: Radiographs 07/26/2024 CLINICAL HISTORY: [id number] Surgery, elective [id number]. Right Im nail; FINDINGS: Two fluoroscopic spot views of the right hip submitted from the operating room. Femoral intramedullary nail with transtrochanteric and distal locking screw fixation traverses proximal femur fracture. FT: 49 sec FD: 5.8463 mGy IMPRESSION: 1. Procedure fluoroscopy during proximal femur fracture fixation. Electronically signed by: Andrea Gasman MD 07/27/2024 03:07 PM EDT RP Workstation: [id number]   DG C-Arm 1-60 Min-No Report Result Date: 07/27/2024 Fluoroscopy was utilized by the requesting physician.  No radiographic interpretation.   DG C-Arm 1-60 Min-No Report Result Date: 07/27/2024 Fluoroscopy was utilized by the requesting physician.  No radiographic interpretation.   DG Knee Right Port Result Date: 07/26/2024 CLINICAL DATA:  Fall and trauma to the right knee. EXAM: PORTABLE RIGHT KNEE - 1-2 VIEW COMPARISON:  None Available. FINDINGS: There is no acute fracture or dislocation. The bones are  osteopenic. Moderate arthritic changes of the knee. No significant effusion. The soft tissues are unremarkable. IMPRESSION: 1. No acute fracture or dislocation. 2. Moderate arthritic changes. Electronically Signed   By: Vanetta Chou M.D.   On: 07/26/2024 18:53   DG Hip Unilat W or Wo Pelvis 2-3 Views Right Result Date: 07/26/2024 CLINICAL DATA:  fall EXAM: DG HIP (WITH OR WITHOUT PELVIS) 2-3V RIGHT COMPARISON:  June 28, 2024 FINDINGS: Osteopenia.No evidence of pelvic fracture or diastasis.Nondisplaced intertrochanteric fracture of the right hip. No dislocation.Multilevel degenerative disc disease of the spine. Mild osteoarthritis of both hips. Peripheral vascular atherosclerosis. IMPRESSION: Osteopenia. Nondisplaced, intertrochanteric fracture of the right hip. Electronically Signed   By: Rogelia Myers M.D.   On: 07/26/2024 17:24   DG Chest 1 View Result Date: 07/26/2024 CLINICAL DATA:  fall EXAM: CHEST  1 VIEW COMPARISON:  June 28, 2024 FINDINGS: Lower  lung volumes. Bilateral perihilar interstitial opacities. Moderate cardiomegaly with superimposed large pericardial effusion again noted. Right chest port in place terminating at the cavoatrial junction. Tortuous aorta with aortic atherosclerosis. No acute fracture or destructive lesions. Multilevel thoracic osteophytosis. IMPRESSION: 1. Bilateral perihilar interstitial opacities,, which may represent bronchovascular crowding due to low lung volumes, interstitial edema, or atypical/viral infection, in the correct clinical context. 2. Moderate cardiomegaly and superimposed pericardial effusion, unchanged. Electronically Signed   By: Rogelia Myers M.D.   On: 07/26/2024 17:22     ASSESSMENT/PLAN:  This is a very pleasant 78 year old African-American female with stage IV (T4, N2, M1 B) non-small cell lung cancer, adenocarcinoma.  She presented with a large right infrahilar mass in addition to right hilar and mediastinal lymphadenopathy as well as a  left adrenal metastasis.  She was diagnosed in December 2024.  She had molecular studies performed by [facility] that showed no actionable mutations.  Her PD-L1 expression is 90%.   The patient is currently undergoing chemotherapy and immunotherapy with carboplatin  for an AUC of 5, Alimta  500 mg/m, Keytruda  200 mg IV every 3 weeks.  She is status post 11 cycles.  Starting from cycle #3, Dr. Sherrod reduced the dose of her chemotherapy due to pancytopenia starting from cycle #4 with carboplatin  for an AUC of 4 and Alimta  400 mg/m.  Starting from cycle #5, Dr. Sherrod discontinued her chemotherapy.  Starting from cycle #6 she will be on single agent maintenance Keytruda .   Labs were reviewed. Recommend she proceed with cycle #12 today as scheduled with single agent keytruda .    She is ok to treat with her creatinine of *** today. She was advised to take her anti-hypertensive when she refers home.   I referred her to nephrology at a prior appointment. She has established with ***.    I also will refer her to cardiology to establish care for monitoring and recommendations for the pericardial effusion.    We will see her back for follow-up visit in 3 weeks for evaluation and repeat blood work before undergoing cycle #13.  The patient was advised to call immediately if she has any concerning symptoms in the interval. The patient voices understanding of current disease status and treatment options and is in agreement with the current care plan. All questions were answered. The patient knows to call the clinic with any problems, questions or concerns. We can certainly see the patient much sooner if necessary    No orders of the defined types were placed in this encounter.    I spent {CHL ONC TIME VISIT - DTPQU:[id number]} counseling the patient face to face. The total time spent in the appointment was {CHL ONC TIME VISIT - DTPQU:[id number]}.  Itai Barbian L Bula Cavalieri, PA-C 08/11/24

## 2024-08-15 DIAGNOSIS — Z9181 History of falling: Secondary | ICD-10-CM | POA: Diagnosis not present

## 2024-08-15 DIAGNOSIS — S72144D Nondisplaced intertrochanteric fracture of right femur, subsequent encounter for closed fracture with routine healing: Secondary | ICD-10-CM | POA: Diagnosis not present

## 2024-08-15 DIAGNOSIS — C3431 Malignant neoplasm of lower lobe, right bronchus or lung: Secondary | ICD-10-CM | POA: Diagnosis not present

## 2024-08-15 DIAGNOSIS — R2681 Unsteadiness on feet: Secondary | ICD-10-CM | POA: Diagnosis not present

## 2024-08-16 ENCOUNTER — Inpatient Hospital Stay: Admitting: Physician Assistant

## 2024-08-16 ENCOUNTER — Inpatient Hospital Stay

## 2024-08-16 ENCOUNTER — Telehealth: Payer: Self-pay

## 2024-08-16 ENCOUNTER — Inpatient Hospital Stay: Attending: Internal Medicine

## 2024-08-16 VITALS — BP 123/79 | HR 62 | Temp 97.8°F | Resp 16 | Ht 64.0 in

## 2024-08-16 DIAGNOSIS — Z87891 Personal history of nicotine dependence: Secondary | ICD-10-CM | POA: Diagnosis not present

## 2024-08-16 DIAGNOSIS — R7989 Other specified abnormal findings of blood chemistry: Secondary | ICD-10-CM | POA: Diagnosis not present

## 2024-08-16 DIAGNOSIS — C3431 Malignant neoplasm of lower lobe, right bronchus or lung: Secondary | ICD-10-CM | POA: Insufficient documentation

## 2024-08-16 DIAGNOSIS — Z79899 Other long term (current) drug therapy: Secondary | ICD-10-CM | POA: Diagnosis not present

## 2024-08-16 DIAGNOSIS — C7971 Secondary malignant neoplasm of right adrenal gland: Secondary | ICD-10-CM | POA: Diagnosis not present

## 2024-08-16 DIAGNOSIS — D61818 Other pancytopenia: Secondary | ICD-10-CM | POA: Diagnosis not present

## 2024-08-16 DIAGNOSIS — Z5112 Encounter for antineoplastic immunotherapy: Secondary | ICD-10-CM | POA: Insufficient documentation

## 2024-08-16 DIAGNOSIS — C7972 Secondary malignant neoplasm of left adrenal gland: Secondary | ICD-10-CM | POA: Insufficient documentation

## 2024-08-16 LAB — CMP (CANCER CENTER ONLY)
ALT: 7 U/L (ref 0–44)
AST: 19 U/L (ref 15–41)
Albumin: 3.2 g/dL — ABNORMAL LOW (ref 3.5–5.0)
Alkaline Phosphatase: 79 U/L (ref 38–126)
Anion gap: 5 (ref 5–15)
BUN: 26 mg/dL — ABNORMAL HIGH (ref 8–23)
CO2: 29 mmol/L (ref 22–32)
Calcium: 9.6 mg/dL (ref 8.9–10.3)
Chloride: 107 mmol/L (ref 98–111)
Creatinine: 2.07 mg/dL — ABNORMAL HIGH (ref 0.44–1.00)
GFR, Estimated: 24 mL/min — ABNORMAL LOW (ref >60)
Glucose, Bld: 86 mg/dL (ref 70–99)
Potassium: 4.1 mmol/L (ref 3.5–5.1)
Sodium: 141 mmol/L (ref 135–145)
Total Bilirubin: 0.5 mg/dL (ref 0.0–1.2)
Total Protein: 8 g/dL (ref 6.5–8.1)

## 2024-08-16 LAB — CBC WITH DIFFERENTIAL (CANCER CENTER ONLY)
Abs Immature Granulocytes: 0.02 K/uL (ref 0.00–0.07)
Basophils Absolute: 0 K/uL (ref 0.0–0.1)
Basophils Relative: 0 %
Eosinophils Absolute: 0.1 K/uL (ref 0.0–0.5)
Eosinophils Relative: 1 %
HCT: 28.6 % — ABNORMAL LOW (ref 36.0–46.0)
Hemoglobin: 9.1 g/dL — ABNORMAL LOW (ref 12.0–15.0)
Immature Granulocytes: 0 %
Lymphocytes Relative: 25 %
Lymphs Abs: 1.6 K/uL (ref 0.7–4.0)
MCH: 27.6 pg (ref 26.0–34.0)
MCHC: 31.8 g/dL (ref 30.0–36.0)
MCV: 86.7 fL (ref 80.0–100.0)
Monocytes Absolute: 0.6 K/uL (ref 0.1–1.0)
Monocytes Relative: 10 %
Neutro Abs: 4 K/uL (ref 1.7–7.7)
Neutrophils Relative %: 64 %
Platelet Count: 240 K/uL (ref 150–400)
RBC: 3.3 MIL/uL — ABNORMAL LOW (ref 3.87–5.11)
RDW: 15.1 % (ref 11.5–15.5)
WBC Count: 6.4 K/uL (ref 4.0–10.5)
nRBC: 0 % (ref 0.0–0.2)

## 2024-08-16 LAB — TSH: TSH: 1.1 u[IU]/mL (ref 0.350–4.500)

## 2024-08-16 MED ORDER — SODIUM CHLORIDE 0.9 % IV SOLN
200.0000 mg | Freq: Once | INTRAVENOUS | Status: AC
  Administered 2024-08-16: 200 mg via INTRAVENOUS
  Filled 2024-08-16: qty 8

## 2024-08-16 MED ORDER — SODIUM CHLORIDE 0.9 % IV SOLN: INTRAVENOUS | Status: DC

## 2024-08-16 NOTE — Patient Instructions (Signed)
 [facility] - A DEPT OF [facility]  Discharge Instructions: Thank you for choosing [facility] to provide your oncology and hematology care.   If you have a lab appointment with the Cancer Center, please go directly to the Cancer Center and check in at the registration area.   Wear comfortable clothing and clothing appropriate for easy access to any Portacath or PICC line.   We strive to give you quality time with your provider. You may need to reschedule your appointment if you arrive late (15 or more minutes).  Arriving late affects you and other patients whose appointments are after yours.  Also, if you miss three or more appointments without notifying the office, you may be dismissed from the clinic at the provider's discretion.      For prescription refill requests, have your pharmacy contact our office and allow 72 hours for refills to be completed.    Today you received the following chemotherapy and/or immunotherapy agents keytruda       To help prevent nausea and vomiting after your treatment, we encourage you to take your nausea medication as directed.  BELOW ARE SYMPTOMS THAT SHOULD BE REPORTED IMMEDIATELY: *FEVER GREATER THAN 100.4 F (38 C) OR HIGHER *CHILLS OR SWEATING *NAUSEA AND VOMITING THAT IS NOT CONTROLLED WITH YOUR NAUSEA MEDICATION *UNUSUAL SHORTNESS OF BREATH *UNUSUAL BRUISING OR BLEEDING *URINARY PROBLEMS (pain or burning when urinating, or frequent urination) *BOWEL PROBLEMS (unusual diarrhea, constipation, pain near the anus) TENDERNESS IN MOUTH AND THROAT WITH OR WITHOUT PRESENCE OF ULCERS (sore throat, sores in mouth, or a toothache) UNUSUAL RASH, SWELLING OR PAIN  UNUSUAL VAGINAL DISCHARGE OR ITCHING   Items with * indicate a potential emergency and should be followed up as soon as possible or go to the Emergency Department if any problems should occur.  Please show the CHEMOTHERAPY ALERT CARD or IMMUNOTHERAPY  ALERT CARD at check-in to the Emergency Department and triage nurse.  Should you have questions after your visit or need to cancel or reschedule your appointment, please contact [facility] - A DEPT OF [facility]  Dept: [phone]  and follow the prompts.  Office hours are 8:00 a.m. to 4:30 p.m. Monday - Friday. Please note that voicemails left after 4:00 p.m. may not be returned until the following business day.  We are closed weekends and major holidays. You have access to a nurse at all times for urgent questions. Please call the main number to the clinic Dept: [phone] and follow the prompts.   For any non-urgent questions, you may also contact your provider using MyChart. We now offer e-Visits for anyone 47 and older to request care online for non-urgent symptoms. For details visit [email or web address].   Also download the MyChart app! Go to the app store, search MyChart, open the app, select [place], and log in with your MyChart username and password.

## 2024-08-16 NOTE — Telephone Encounter (Signed)
 Patient's daughter called to inquire about medication to help with claustrophobia for the patient's upcoming CT scan scheduled on 08/30/24. She requested that, if prescribed, the medication be sent to [facility], where the patient is currently residing. Information was forwarded to the providers for review.

## 2024-08-17 ENCOUNTER — Encounter: Payer: Self-pay | Admitting: Internal Medicine

## 2024-08-17 ENCOUNTER — Encounter: Payer: Self-pay | Admitting: Family Medicine

## 2024-08-17 ENCOUNTER — Other Ambulatory Visit: Payer: Self-pay

## 2024-08-17 LAB — T4: T4, Total: 7 ug/dL (ref 4.5–12.0)

## 2024-08-17 NOTE — Telephone Encounter (Signed)
 S/w patient's daughter, Clemencia, and informed her that Dr. Sherrod does not typically prescribe medication for CT scans. Daughter verbalized an understanding of the information and stated that she thought that her mother would be having a MRI done.  Patient voiced appreciation for the call back. All questions answered at time of call.

## 2024-08-21 DIAGNOSIS — Z4889 Encounter for other specified surgical aftercare: Secondary | ICD-10-CM | POA: Diagnosis not present

## 2024-08-21 DIAGNOSIS — S72141D Displaced intertrochanteric fracture of right femur, subsequent encounter for closed fracture with routine healing: Secondary | ICD-10-CM | POA: Diagnosis not present

## 2024-08-22 DIAGNOSIS — R2681 Unsteadiness on feet: Secondary | ICD-10-CM | POA: Diagnosis not present

## 2024-08-22 DIAGNOSIS — C3431 Malignant neoplasm of lower lobe, right bronchus or lung: Secondary | ICD-10-CM | POA: Diagnosis not present

## 2024-08-22 DIAGNOSIS — Z9181 History of falling: Secondary | ICD-10-CM | POA: Diagnosis not present

## 2024-08-22 DIAGNOSIS — S72144D Nondisplaced intertrochanteric fracture of right femur, subsequent encounter for closed fracture with routine healing: Secondary | ICD-10-CM | POA: Diagnosis not present

## 2024-08-27 DIAGNOSIS — R2681 Unsteadiness on feet: Secondary | ICD-10-CM | POA: Diagnosis not present

## 2024-08-27 DIAGNOSIS — Z9181 History of falling: Secondary | ICD-10-CM | POA: Diagnosis not present

## 2024-08-27 DIAGNOSIS — C3431 Malignant neoplasm of lower lobe, right bronchus or lung: Secondary | ICD-10-CM | POA: Diagnosis not present

## 2024-08-27 DIAGNOSIS — S72144D Nondisplaced intertrochanteric fracture of right femur, subsequent encounter for closed fracture with routine healing: Secondary | ICD-10-CM | POA: Diagnosis not present

## 2024-08-29 DIAGNOSIS — S72144D Nondisplaced intertrochanteric fracture of right femur, subsequent encounter for closed fracture with routine healing: Secondary | ICD-10-CM | POA: Diagnosis not present

## 2024-08-29 DIAGNOSIS — Z9849 Cataract extraction status, unspecified eye: Secondary | ICD-10-CM | POA: Diagnosis not present

## 2024-08-29 DIAGNOSIS — D631 Anemia in chronic kidney disease: Secondary | ICD-10-CM | POA: Diagnosis not present

## 2024-08-29 DIAGNOSIS — C3431 Malignant neoplasm of lower lobe, right bronchus or lung: Secondary | ICD-10-CM | POA: Diagnosis not present

## 2024-08-29 DIAGNOSIS — I129 Hypertensive chronic kidney disease with stage 1 through stage 4 chronic kidney disease, or unspecified chronic kidney disease: Secondary | ICD-10-CM | POA: Diagnosis not present

## 2024-08-29 DIAGNOSIS — N184 Chronic kidney disease, stage 4 (severe): Secondary | ICD-10-CM | POA: Diagnosis not present

## 2024-08-29 DIAGNOSIS — M8580 Other specified disorders of bone density and structure, unspecified site: Secondary | ICD-10-CM | POA: Diagnosis not present

## 2024-08-30 ENCOUNTER — Ambulatory Visit (HOSPITAL_COMMUNITY)
Admission: RE | Admit: 2024-08-30 | Discharge: 2024-08-30 | Disposition: A | Discharge status: Home / Self Care | Source: Ambulatory Visit | Attending: Physician Assistant | Admitting: Physician Assistant

## 2024-08-30 ENCOUNTER — Other Ambulatory Visit: Payer: Self-pay | Admitting: Internal Medicine

## 2024-08-30 DIAGNOSIS — C3431 Malignant neoplasm of lower lobe, right bronchus or lung: Secondary | ICD-10-CM | POA: Insufficient documentation

## 2024-09-02 NOTE — Progress Notes (Deleted)
 [facility] OFFICE PROGRESS NOTE  [name], MD [address] [place] [address]  DIAGNOSIS:  Stage IV (T4, N2, M1 B) non-small cell lung cancer, adenocarcinoma presented with large right infrahilar mass in addition to right hilar and mediastinal lymphadenopathy as well as left adrenal gland metastasis diagnosed in December 2024.    Biomarker Findings Tumor Mutational Burden - 24 Muts/Mb HRD signature - HRDsig Negative Microsatellite status - MS-Stable Genomic Findings For a complete list of the genes assayed, please refer to the Appendix. BRAF G464V KRAS G13C ASXL1 deletion exon 12 TP53 splice site 920-2A>T 6 Disease relevant genes with no reportable alterations: ALK, EGFR, ERBB2, MET, RET, ROS1   PDL1 Expression: 90%   PRIOR THERAPY: None  CURRENT THERAPY: Systemic treatment with combination of chemoimmunotherapy with carboplatin  for AUC of 5, Alimta  500 Mg/M2 and Keytruda  200 Mg IV every 3 weeks.  First dose December 08, 2023.  Status post 12 cycles.  Starting from cycle #3 her dose of carboplatin  will be for AUC of 4 and Alimta  400 Mg/M2 secondary to pancytopenia.  Starting cycle #5 she was on maintenance treatment with Alimta  and Keytruda  every 3 weeks.  Starting from cycle #6 she will be on single agent Keytruda  every 3 weeks.  Alimta  discontinued secondary to toxicity.   INTERVAL HISTORY: Tami Shea 78 y.o. female returns to the clinic today for a follow-up visit.   She is currently in [facility] due to a hip fracture in September. She is undergoing PT. She believes she is doing well. Currently, she reports the fracture is '***'.    She is undergoing infusion treatments and had a recent infusion scheduled for today. No new issues were reported with her last treatment, including no fevers, chills, night sweats, shortness of breath, cough, rashes, nausea, vomiting, headaches, or vision changes.    ***She experienced diarrhea, which she attributes to  consuming a large amount of fresh fruit, including watermelon, chamomile, and honeydew. The diarrhea lasted for three to four days, occurring two to three times a Hyun. She attempted to manage it with Imodium, but found it 'ineffective' when only taking one tablet. Her diarrhea has resolved.***   She wants to leave the nursing facility soon and mentions that the food there is not to her liking. She is tired and hopes to avoid future falls. She recently had a restaging CT scan.    She is here today for evaluation before undergoing cycle #13.    MEDICAL HISTORY: Past Medical History:  Diagnosis Date   Dyspnea    History of kidney stones    HTN (hypertension)    Smoker     ALLERGIES:  has no known allergies.  MEDICATIONS:  Current Outpatient Medications  Medication Sig Dispense Refill   amLODipine  (NORVASC ) 10 MG tablet Take 1 tablet (10 mg total) by mouth daily.     aspirin  (ASPIRIN  CHILDRENS) 81 MG chewable tablet Chew 1 tablet (81 mg total) by mouth 2 (two) times daily with a meal. 90 tablet 0   Cholecalciferol (VITAMIN D3) 1.25 MG (50000 UT) CAPS Take 1 capsule by mouth once a week.     gatifloxacin  (ZYMAXID ) 0.5 % SOLN Place 1 drop into the left eye 3 (three) times daily.     ketorolac  (ACULAR ) 0.5 % ophthalmic solution Place 1 drop into the left eye 2 (two) times daily.     lidocaine -prilocaine  (EMLA ) cream Apply to affected area once (Patient taking differently: Apply 1 Application topically  as needed [facility] access). Apply to affected area once) 30 g 3   loperamide (IMODIUM) 2 MG capsule Take 2 mg by mouth as needed for diarrhea or loose stools (pt has been taking daily).     ondansetron  (ZOFRAN ) 8 MG tablet Take 1 tablet (8 mg total) by mouth every 8 (eight) hours as needed for nausea or vomiting. Start on the third Car after carboplatin . (Patient not taking: Reported on 03/08/2024) 30 tablet 1   [Paused] potassium chloride  SA (KLOR-CON  M) 20 MEQ tablet Take 1 tablet (20 mEq total) by  mouth 2 (two) times daily. 14 tablet 0   prednisoLONE  acetate (PRED FORTE ) 1 % ophthalmic suspension Place 1 drop into the left eye 3 (three) times daily.     prochlorperazine  (COMPAZINE ) 10 MG tablet Take 1 tablet (10 mg total) by mouth every 6 (six) hours as needed for nausea or vomiting. 30 tablet 1   No current facility-administered medications for this visit.    SURGICAL HISTORY:  Past Surgical History:  Procedure Laterality Date   BRONCHIAL NEEDLE ASPIRATION BIOPSY  10/24/2023   Procedure: BRONCHIAL NEEDLE ASPIRATION BIOPSIES;  Surgeon: Brenna Adine CROME, DO;  Location: [facility];  Service: Pulmonary;;   COLONOSCOPY     INTRAMEDULLARY (IM) NAIL INTERTROCHANTERIC Right 07/27/2024   Procedure: FIXATION, FRACTURE, INTERTROCHANTERIC, WITH INTRAMEDULLARY ROD;  Surgeon: Fidel Rogue, MD;  Location: [facility];  Service: Orthopedics;  Laterality: Right;   IR IMAGING GUIDED PORT INSERTION  12/09/2023   PILONIDAL CYST EXCISION     tonisllectomy     VIDEO BRONCHOSCOPY WITH ENDOBRONCHIAL ULTRASOUND Right 10/24/2023   Procedure: VIDEO BRONCHOSCOPY WITH ENDOBRONCHIAL ULTRASOUND;  Surgeon: Brenna Adine CROME, DO;  Location: [facility];  Service: Pulmonary;  Laterality: Right;    REVIEW OF SYSTEMS:   Review of Systems  Constitutional: Negative for appetite change, chills, fatigue, fever and unexpected weight change.  HENT:   Negative for mouth sores, nosebleeds, sore throat and trouble swallowing.   Eyes: Negative for eye problems and icterus.  Respiratory: Negative for cough, hemoptysis, shortness of breath and wheezing.   Cardiovascular: Negative for chest pain and leg swelling.  Gastrointestinal: Negative for abdominal pain, constipation, diarrhea, nausea and vomiting.  Genitourinary: Negative for bladder incontinence, difficulty urinating, dysuria, frequency and hematuria.   Musculoskeletal: Negative for back pain, gait problem, neck pain and neck stiffness.  Skin: Negative for itching and  rash.  Neurological: Negative for dizziness, extremity weakness, gait problem, headaches, light-headedness and seizures.  Hematological: Negative for adenopathy. Does not bruise/bleed easily.  Psychiatric/Behavioral: Negative for confusion, depression and sleep disturbance. The patient is not nervous/anxious.     PHYSICAL EXAMINATION:  There were no vitals taken for this visit.  ECOG PERFORMANCE STATUS: {CHL ONC ECOG [id number]  Physical Exam  Constitutional: Oriented to person, place, and time and well-developed, well-nourished, and in no distress. No distress.  HENT:  Head: Normocephalic and atraumatic.  Mouth/Throat: Oropharynx is clear and moist. No oropharyngeal exudate.  Eyes: Conjunctivae are normal. Right eye exhibits no discharge. Left eye exhibits no discharge. No scleral icterus.  Neck: Normal range of motion. Neck supple.  Cardiovascular: Normal rate, regular rhythm, normal heart sounds and intact distal pulses.   Pulmonary/Chest: Effort normal and breath sounds normal. No respiratory distress. No wheezes. No rales.  Abdominal: Soft. Bowel sounds are normal. Exhibits no distension and no mass. There is no tenderness.  Musculoskeletal: Normal range of motion. Exhibits no edema.  Lymphadenopathy:    No cervical adenopathy.  Neurological: Alert and  oriented to person, place, and time. Exhibits normal muscle tone. Gait normal. Coordination normal.  Skin: Skin is warm and dry. No rash noted. Not diaphoretic. No erythema. No pallor.  Psychiatric: Mood, memory and judgment normal.  Vitals reviewed.  LABORATORY DATA: Lab Results  Component Value Date   WBC 6.4 08/16/2024   HGB 9.1 (L) 08/16/2024   HCT 28.6 (L) 08/16/2024   MCV 86.7 08/16/2024   PLT 240 08/16/2024      Chemistry      Component Value Date/Time   NA 141 08/16/2024 1006   NA 142 10/12/2021 1043   K 4.1 08/16/2024 1006   CL 107 08/16/2024 1006   CO2 29 08/16/2024 1006   BUN 26 (H) 08/16/2024 1006    BUN 17 10/12/2021 1043   CREATININE 2.07 (H) 08/16/2024 1006      Component Value Date/Time   CALCIUM 9.6 08/16/2024 1006   ALKPHOS 79 08/16/2024 1006   AST 19 08/16/2024 1006   ALT 7 08/16/2024 1006   BILITOT 0.5 08/16/2024 1006       RADIOGRAPHIC STUDIES:  No results found.   ASSESSMENT/PLAN:  This is a very pleasant 78 year old African-American female with stage IV (T4, N2, M1 B) non-small cell lung cancer, adenocarcinoma.  She presented with a large right infrahilar mass in addition to right hilar and mediastinal lymphadenopathy as well as a left adrenal metastasis.  She was diagnosed in December 2024.  She had molecular studies performed by [facility] that showed no actionable mutations.  Her PD-L1 expression is 90%.   The patient is currently undergoing chemotherapy and immunotherapy with carboplatin  for an AUC of 5, Alimta  500 mg/m, Keytruda  200 mg IV every 3 weeks.  She is status post 12 cycles.  Starting from cycle #3, Dr. Sherrod reduced the dose of her chemotherapy due to pancytopenia starting from cycle #4 with carboplatin  for an AUC of 4 and Alimta  400 mg/m.  Starting from cycle #5, Dr. Sherrod discontinued her chemotherapy.  Starting from cycle #6 she will be on single agent maintenance Keytruda .  The patient was seen with Dr. Sherrod today.  Dr. Sherrod personally and independently reviewed the scan and discussed results with the patient today.  The scan showed ***.  Dr. Sherrod recommends ***    Labs were reviewed. Recommend she *** with cycle #13 today as scheduled with single agent keytruda .    She is ok to treat with her creatinine of *** today. We will administer 1/2 L over 1 hour with treatment. ****   I referred her to nephrology at a prior appointment. I previously referred to cardiology to establish care for monitoring and recommendations for the pericardial effusion. ***   We will see her back for follow-up visit in 3 weeks for evaluation and repeat blood  work before undergoing cycle #14.  The patient was advised to call immediately if she has any concerning symptoms in the interval. The patient voices understanding of current disease status and treatment options and is in agreement with the current care plan. All questions were answered. The patient knows to call the clinic with any problems, questions or concerns. We can certainly see the patient much sooner if necessary   No orders of the defined types were placed in this encounter.    I spent {CHL ONC TIME VISIT - DTPQU:[id number]} counseling the patient face to face. The total time spent in the appointment was {CHL ONC TIME VISIT - DTPQU:[id number]}.  Lunden Mcleish L Damion Kant, PA-C 09/02/24

## 2024-09-05 ENCOUNTER — Other Ambulatory Visit: Payer: Self-pay | Admitting: Physician Assistant

## 2024-09-05 DIAGNOSIS — D649 Anemia, unspecified: Secondary | ICD-10-CM

## 2024-09-06 ENCOUNTER — Telehealth: Payer: Self-pay

## 2024-09-06 ENCOUNTER — Other Ambulatory Visit: Payer: Self-pay | Admitting: *Deleted

## 2024-09-06 ENCOUNTER — Inpatient Hospital Stay

## 2024-09-06 ENCOUNTER — Inpatient Hospital Stay: Admitting: Physician Assistant

## 2024-09-06 ENCOUNTER — Telehealth: Payer: Self-pay | Admitting: *Deleted

## 2024-09-06 ENCOUNTER — Other Ambulatory Visit: Payer: Self-pay | Admitting: Physician Assistant

## 2024-09-06 ENCOUNTER — Other Ambulatory Visit: Payer: Self-pay

## 2024-09-06 VITALS — BP 106/71 | HR 66 | Temp 98.3°F | Resp 16

## 2024-09-06 DIAGNOSIS — E876 Hypokalemia: Secondary | ICD-10-CM

## 2024-09-06 DIAGNOSIS — R918 Other nonspecific abnormal finding of lung field: Secondary | ICD-10-CM | POA: Diagnosis not present

## 2024-09-06 DIAGNOSIS — Z5112 Encounter for antineoplastic immunotherapy: Secondary | ICD-10-CM

## 2024-09-06 DIAGNOSIS — C3431 Malignant neoplasm of lower lobe, right bronchus or lung: Secondary | ICD-10-CM

## 2024-09-06 DIAGNOSIS — D649 Anemia, unspecified: Secondary | ICD-10-CM

## 2024-09-06 LAB — CBC WITH DIFFERENTIAL (CANCER CENTER ONLY)
Abs Immature Granulocytes: 0.01 K/uL (ref 0.00–0.07)
Basophils Absolute: 0 K/uL (ref 0.0–0.1)
Basophils Relative: 0 %
Eosinophils Absolute: 0 K/uL (ref 0.0–0.5)
Eosinophils Relative: 0 %
HCT: 29 % — ABNORMAL LOW (ref 36.0–46.0)
Hemoglobin: 9.4 g/dL — ABNORMAL LOW (ref 12.0–15.0)
Immature Granulocytes: 0 %
Lymphocytes Relative: 31 %
Lymphs Abs: 1.5 K/uL (ref 0.7–4.0)
MCH: 27.4 pg (ref 26.0–34.0)
MCHC: 32.4 g/dL (ref 30.0–36.0)
MCV: 84.5 fL (ref 80.0–100.0)
Monocytes Absolute: 0.8 K/uL (ref 0.1–1.0)
Monocytes Relative: 16 %
Neutro Abs: 2.6 K/uL (ref 1.7–7.7)
Neutrophils Relative %: 53 %
Platelet Count: 191 K/uL (ref 150–400)
RBC: 3.43 MIL/uL — ABNORMAL LOW (ref 3.87–5.11)
RDW: 15.3 % (ref 11.5–15.5)
WBC Count: 4.9 K/uL (ref 4.0–10.5)
nRBC: 0 % (ref 0.0–0.2)

## 2024-09-06 LAB — URINALYSIS, COMPLETE (UACMP) WITH MICROSCOPIC
Bilirubin Urine: NEGATIVE
Glucose, UA: NEGATIVE mg/dL
Hgb urine dipstick: NEGATIVE
Ketones, ur: NEGATIVE mg/dL
Leukocytes,Ua: NEGATIVE
Nitrite: NEGATIVE
Protein, ur: 100 mg/dL — AB
Specific Gravity, Urine: 1.017 (ref 1.005–1.030)
pH: 5 (ref 5.0–8.0)

## 2024-09-06 LAB — IRON AND IRON BINDING CAPACITY (CC-WL,HP ONLY)
Iron: 28 ug/dL (ref 28–170)
Saturation Ratios: 15 % (ref 10.4–31.8)
TIBC: 190 ug/dL — ABNORMAL LOW (ref 250–450)
UIBC: 162 ug/dL (ref 148–442)

## 2024-09-06 LAB — CMP (CANCER CENTER ONLY)
ALT: 6 U/L (ref 0–44)
AST: 16 U/L (ref 15–41)
Albumin: 3.6 g/dL (ref 3.5–5.0)
Alkaline Phosphatase: 69 U/L (ref 38–126)
Anion gap: 10 (ref 5–15)
BUN: 24 mg/dL — ABNORMAL HIGH (ref 8–23)
CO2: 24 mmol/L (ref 22–32)
Calcium: 9.5 mg/dL (ref 8.9–10.3)
Chloride: 107 mmol/L (ref 98–111)
Creatinine: 2.17 mg/dL — ABNORMAL HIGH (ref 0.44–1.00)
GFR, Estimated: 23 mL/min — ABNORMAL LOW (ref >60)
Glucose, Bld: 96 mg/dL (ref 70–99)
Potassium: 2.8 mmol/L — ABNORMAL LOW (ref 3.5–5.1)
Sodium: 141 mmol/L (ref 135–145)
Total Bilirubin: 0.5 mg/dL (ref 0.0–1.2)
Total Protein: 8.5 g/dL — ABNORMAL HIGH (ref 6.5–8.1)

## 2024-09-06 LAB — VITAMIN B12: Vitamin B-12: 968 pg/mL — ABNORMAL HIGH (ref 180–914)

## 2024-09-06 LAB — SAMPLE TO BLOOD BANK

## 2024-09-06 LAB — FERRITIN: Ferritin: 1501 ng/mL — ABNORMAL HIGH (ref 11–307)

## 2024-09-06 LAB — FOLATE: Folate: 12.5 ng/mL (ref >5.9)

## 2024-09-06 MED ORDER — POTASSIUM CHLORIDE CRYS ER 20 MEQ PO TBCR: 20.0000 meq | EXTENDED_RELEASE_TABLET | Freq: Two times a day (BID) | ORAL | 0 refills | Status: AC

## 2024-09-06 MED ORDER — SODIUM CHLORIDE 0.9 % IV SOLN
200.0000 mg | Freq: Once | INTRAVENOUS | Status: AC
  Administered 2024-09-06: 200 mg via INTRAVENOUS
  Filled 2024-09-06: qty 8

## 2024-09-06 MED ORDER — POTASSIUM CHLORIDE 10 MEQ/100ML IV SOLN
10.0000 meq | Freq: Once | INTRAVENOUS | Status: AC
  Administered 2024-09-06: 10 meq via INTRAVENOUS
  Filled 2024-09-06: qty 100

## 2024-09-06 MED ORDER — SODIUM CHLORIDE 0.9 % IV SOLN: INTRAVENOUS | Status: DC

## 2024-09-06 NOTE — Progress Notes (Signed)
 [facility] OFFICE PROGRESS NOTE  [name], MD [address] [place] [address]  DIAGNOSIS:  Stage IV (T4, N2, M1 B) non-small cell lung cancer, adenocarcinoma presented with large right infrahilar mass in addition to right hilar and mediastinal lymphadenopathy as well as left adrenal gland metastasis diagnosed in December 2024.    Biomarker Findings Tumor Mutational Burden - 24 Muts/Mb HRD signature - HRDsig Negative Microsatellite status - MS-Stable Genomic Findings For a complete list of the genes assayed, please refer to the Appendix. BRAF G464V KRAS G13C ASXL1 deletion exon 12 TP53 splice site 920-2A>T 6 Disease relevant genes with no reportable alterations: ALK, EGFR, ERBB2, MET, RET, ROS1   PDL1 Expression: 90%   PRIOR THERAPY: None  CURRENT THERAPY: Systemic treatment with combination of chemoimmunotherapy with carboplatin  for AUC of 5, Alimta  500 Mg/M2 and Keytruda  200 Mg IV every 3 weeks.  First dose December 08, 2023.  Status post 12 cycles.  Starting from cycle #3 her dose of carboplatin  will be for AUC of 4 and Alimta  400 Mg/M2 secondary to pancytopenia.  Starting cycle #5 she was on maintenance treatment with Alimta  and Keytruda  every 3 weeks.  Starting from cycle #6 she will be on single agent Keytruda  every 3 weeks.  Alimta  discontinued secondary to toxicity.   INTERVAL HISTORY: Tami Shea 78 y.o. female returns to the clinic today for a follow-up visit accompanied by her son.  The patient was a little disoriented when she first got to the clinic today.  She got to the clinic and was wanting to cancel her appointment for today.   She does have some underlying memory impairment for which the patient's family was going to talk to about some memory care for her.  The patient believes she has an appointment with her PCP tomorrow.  However she was a little more confused when she initially got to the clinic.  While in the exam room, she was  oriented to person, place, and the month/year.  The patient did have a hip fracture in September 2025.  She was then at [facility] skilled nursing facility for rehab.  She has since returned home and lives with her daughter.  It sounds like the patient did attend her postop follow-up visit as she stated that she had stitches removed at that time.  She states she was active yesterday and her hip feels a little bit tight today which is why she wanted to initially cancel her appointment.  She denies any unusual symptoms today.  She denies any fever, chills, night sweats, or unexplained weight loss.  She does report a change in her taste with meat and has been adjusting her diet accordingly.  She has been eating a lot of fruit sometimes exacerbates diarrhea.  She had an episode of diarrhea for 2 to 3 days about 2 to 3 weeks ago with over 2 loose bowel movements per Baez.  Her diarrhea have subsided at this time.  She denies any blood in the stool or any unusual abdominal pain at this time.  She may have had mild nausea around the time of the diarrhea but did not need to use any of her antiemetics.  She denies any signs of infection including skin infections, urinary symptoms, or respiratory issues.  She denies any shortness of breath, increased cough, or hemoptysis.  She denies any rashes or skin changes.    I referred the patient to cardiology for a large pericardial effusion in August  2025.  She did not see cardiology which may have been due to her being hospitalized in September.  I also had referred her to nephrology for chronic kidney disease.  She also does not have any upcoming appointments with nephrology.   She recently had a restaging CT scan.    She is here today for evaluation before undergoing cycle #13.  MEDICAL HISTORY: Past Medical History:  Diagnosis Date   Dyspnea    History of kidney stones    HTN (hypertension)    Smoker     ALLERGIES:  has no known allergies.  MEDICATIONS:   Current Outpatient Medications  Medication Sig Dispense Refill   amLODipine  (NORVASC ) 10 MG tablet Take 1 tablet (10 mg total) by mouth daily.     aspirin  (ASPIRIN  CHILDRENS) 81 MG chewable tablet Chew 1 tablet (81 mg total) by mouth 2 (two) times daily with a meal. 90 tablet 0   Cholecalciferol (VITAMIN D3) 1.25 MG (50000 UT) CAPS Take 1 capsule by mouth once a week.     gatifloxacin  (ZYMAXID ) 0.5 % SOLN Place 1 drop into the left eye 3 (three) times daily.     ketorolac  (ACULAR ) 0.5 % ophthalmic solution Place 1 drop into the left eye 2 (two) times daily.     lidocaine -prilocaine  (EMLA ) cream Apply to affected area once 30 g 3   loperamide (IMODIUM) 2 MG capsule Take 2 mg by mouth as needed for diarrhea or loose stools (pt has been taking daily).     potassium chloride  SA (KLOR-CON  M) 20 MEQ tablet Take 1 tablet (20 mEq total) by mouth 2 (two) times daily. 18 tablet 0   prednisoLONE  acetate (PRED FORTE ) 1 % ophthalmic suspension Place 1 drop into the left eye 3 (three) times daily.     prochlorperazine  (COMPAZINE ) 10 MG tablet Take 1 tablet (10 mg total) by mouth every 6 (six) hours as needed for nausea or vomiting. 30 tablet 1   ondansetron  (ZOFRAN ) 8 MG tablet Take 1 tablet (8 mg total) by mouth every 8 (eight) hours as needed for nausea or vomiting. Start on the third Lukens after carboplatin . (Patient not taking: Reported on 09/06/2024) 30 tablet 1   [Paused] potassium chloride  SA (KLOR-CON  M) 20 MEQ tablet Take 1 tablet (20 mEq total) by mouth 2 (two) times daily. 14 tablet 0   No current facility-administered medications for this visit.   Facility-Administered Medications Ordered in Other Visits  Medication Dose Route Frequency Provider Last Rate Last Admin   0.9 %  sodium chloride  infusion   Intravenous Continuous Sherrod Sherrod, MD 10 mL/hr at 09/06/24 1242 New Bag at 09/06/24 1242   pembrolizumab  (KEYTRUDA ) 200 mg in sodium chloride  0.9 % 50 mL chemo infusion  200 mg Intravenous Once  Mohamed, Mohamed, MD       potassium chloride  10 mEq in 100 mL IVPB  10 mEq Intravenous Once Gunter Conde L, PA-C 100 mL/hr at 09/06/24 1248 10 mEq at 09/06/24 1248    SURGICAL HISTORY:  Past Surgical History:  Procedure Laterality Date   BRONCHIAL NEEDLE ASPIRATION BIOPSY  10/24/2023   Procedure: BRONCHIAL NEEDLE ASPIRATION BIOPSIES;  Surgeon: Brenna Adine CROME, DO;  Location: [facility];  Service: Pulmonary;;   COLONOSCOPY     INTRAMEDULLARY (IM) NAIL INTERTROCHANTERIC Right 07/27/2024   Procedure: FIXATION, FRACTURE, INTERTROCHANTERIC, WITH INTRAMEDULLARY ROD;  Surgeon: Fidel Rogue, MD;  Location: [facility];  Service: Orthopedics;  Laterality: Right;   IR IMAGING GUIDED PORT INSERTION  12/09/2023   PILONIDAL  CYST EXCISION     tonisllectomy     VIDEO BRONCHOSCOPY WITH ENDOBRONCHIAL ULTRASOUND Right 10/24/2023   Procedure: VIDEO BRONCHOSCOPY WITH ENDOBRONCHIAL ULTRASOUND;  Surgeon: Brenna Adine CROME, DO;  Location: [facility];  Service: Pulmonary;  Laterality: Right;    REVIEW OF SYSTEMS:   Constitutional: Stable fatigue. Negative for appetite change, chills, fever and unexpected weight change.  HENT: Negative for mouth sores, nosebleeds, sore throat and trouble swallowing.   Eyes: Negative for eye problems and icterus.  Respiratory: Negative for cough, hemoptysis, shortness of breath and wheezing.   Cardiovascular: Negative for chest pain and leg swelling.  Gastrointestinal: Negative for abdominal pain, constipation, diarrhea (resolved), nausea and vomiting.  Genitourinary: Negative for bladder incontinence, difficulty urinating, dysuria, frequency and hematuria.   Musculoskeletal: Right hip tightness. Negative for back pain, gait problem, neck pain and neck stiffness.  Skin: Negative for itching and rash. Stable hyperpigmentation of her posterior left ankle. Neurological: Negative for dizziness, extremity weakness, gait problem, headaches, light-headedness and seizures.   Hematological: Negative for adenopathy. Does not bruise/bleed easily.  Psychiatric/Behavioral: Negative for confusion, depression and sleep disturbance. The patient is not nervous/anxious.     PHYSICAL EXAMINATION:  Blood pressure 106/71, pulse 66, temperature 98.3 F (36.8 C), temperature source Temporal, resp. rate 16, SpO2 98%.  ECOG PERFORMANCE STATUS: 2  Physical Exam  Constitutional: Oriented to person, place, and time and well-developed, well-nourished, and in no distress.  HENT:  Head: Normocephalic and atraumatic.  Mouth/Throat: Oropharynx is clear and moist. No oropharyngeal exudate.  Eyes: Conjunctivae are normal. Right eye exhibits no discharge. Left eye exhibits no discharge. No scleral icterus.  Neck: Normal range of motion. Neck supple.  Cardiovascular: Normal rate, regular rhythm, normal heart sounds and intact distal pulses.   Pulmonary/Chest: Effort normal and breath sounds normal. No respiratory distress. No wheezes. No rales.  Abdominal: Soft. Bowel sounds are normal. Exhibits no distension and no mass. There is no tenderness.  Musculoskeletal: Normal range of motion. Exhibits no edema.  Lymphadenopathy:    No cervical adenopathy.  Neurological: Alert and oriented to person, place, and time. Exhibits examined in the wheelchair Skin: Skin is warm and dry. No rash noted. Not diaphoretic. No erythema. No pallor.  Psychiatric: Mood, memory and judgment normal. Positive for some memory impairment but oriented to person, place, and time.   LABORATORY DATA: Lab Results  Component Value Date   WBC 4.9 09/06/2024   HGB 9.4 (L) 09/06/2024   HCT 29.0 (L) 09/06/2024   MCV 84.5 09/06/2024   PLT 191 09/06/2024      Chemistry      Component Value Date/Time   NA 141 09/06/2024 1119   NA 142 10/12/2021 1043   K 2.8 (L) 09/06/2024 1119   CL 107 09/06/2024 1119   CO2 24 09/06/2024 1119   BUN 24 (H) 09/06/2024 1119   BUN 17 10/12/2021 1043   CREATININE 2.17 (H)  09/06/2024 1119      Component Value Date/Time   CALCIUM 9.5 09/06/2024 1119   ALKPHOS 69 09/06/2024 1119   AST 16 09/06/2024 1119   ALT 6 09/06/2024 1119   BILITOT 0.5 09/06/2024 1119       RADIOGRAPHIC STUDIES:  CT CHEST ABDOMEN PELVIS WO CONTRAST Result Date: 09/03/2024 CLINICAL DATA:  Metastatic disease evaluation, right lower lobe adenocarcinoma * Tracking Code: BO * EXAM: CT CHEST, ABDOMEN AND PELVIS WITHOUT CONTRAST TECHNIQUE: Multidetector CT imaging of the chest, abdomen and pelvis was performed following the standard protocol without IV contrast.  RADIATION DOSE REDUCTION: This exam was performed according to the departmental dose-optimization program which includes automated exposure control, adjustment of the mA and/or kV according to patient size and/or use of iterative reconstruction technique. COMPARISON:  06/28/2024 FINDINGS: CT CHEST FINDINGS Cardiovascular: Right chest port catheter. Aortic atherosclerosis. Cardiomegaly. Extensive three-vessel coronary artery calcifications. Unchanged large pericardial effusion. Mediastinum/Nodes: No enlarged mediastinal, hilar, or axillary lymph nodes. Thyroid  gland, trachea, and esophagus demonstrate no significant findings. Lungs/Pleura: Unchanged post treatment appearance of the chest with treated mass and near complete atelectasis or consolidation of the right lower lobe as well as bandlike scarring and fibrosis in the suprahilar right upper lobe. Unchanged small loculated appearing right pleural effusion. Unchanged mass in the perihilar right lobe with heterogeneous internal fat attenuation, again most likely an incidentally present hamartoma (series 2, image 32). Unchanged tiny 0.2 cm nodule of the peripheral left lower lobe (series 6, image 83). Musculoskeletal: No chest wall abnormality. No acute osseous findings. CT ABDOMEN PELVIS FINDINGS Hepatobiliary: No solid liver abnormality is seen. Sludge contracted in the gallbladder. No  gallstones, gallbladder wall thickening, or biliary dilatation. Pancreas: Unremarkable. No pancreatic ductal dilatation or surrounding inflammatory changes. Spleen: Normal in size without significant abnormality. Adrenals/Urinary Tract: Adrenal glands are unremarkable. Large nonobstructive calculus of the inferior pole of the left kidney. No right-sided calculi, ureteral calculi, or hydronephrosis. Bladder is unremarkable. Stomach/Bowel: Stomach is within normal limits. Appendix appears normal. No evidence of bowel wall thickening, distention, or inflammatory changes. Vascular/Lymphatic: Aortic atherosclerosis. No enlarged abdominal or pelvic lymph nodes. Reproductive: No mass or other abnormality. Other: No abdominal wall hernia.  Anasarca.  No ascites. Musculoskeletal: No acute osseous findings. Disc degenerative disease and bridging osteophytosis of the thoracic spine in keeping with DISH. Intramedullary nail fixation of the right hip. IMPRESSION: 1. Unchanged post treatment appearance of the chest with treated mass and near complete atelectasis or consolidation of the right lower lobe as well as bandlike scarring and fibrosis in the suprahilar right upper lobe. 2. Unchanged small loculated appearing right pleural effusion. 3. Unchanged mass in the perihilar right lobe with heterogeneous internal fat attenuation, again most likely an incidentally present hamartoma. 4. Unchanged tiny 0.2 cm nodule of the peripheral left lower lobe, nonspecific and most likely benign and incidental. 5. No noncontrast evidence of lymphadenopathy or metastatic disease in the abdomen or pelvis. 6. Unchanged large pericardial effusion. 7. Cardiomegaly and coronary artery disease. 8. Nonobstructive left nephrolithiasis. 9. Aortic atherosclerosis. Aortic Atherosclerosis (ICD10-I70.0). Electronically Signed   By: Marolyn JONETTA Jaksch M.D.   On: 09/03/2024 07:29     ASSESSMENT/PLAN:   ASSESSMENT/PLAN:  This is a very pleasant 78 year old  African-American female with stage IV (T4, N2, M1 B) non-small cell lung cancer, adenocarcinoma.  She presented with a large right infrahilar mass in addition to right hilar and mediastinal lymphadenopathy as well as a left adrenal metastasis.  She was diagnosed in December 2024.  She had molecular studies performed by [facility] that showed no actionable mutations.  Her PD-L1 expression is 90%.   The patient is currently undergoing chemotherapy and immunotherapy with carboplatin  for an AUC of 5, Alimta  500 mg/m, Keytruda  200 mg IV every 3 weeks.  She is status post 12 cycles.  Starting from cycle #3, Dr. Sherrod reduced the dose of her chemotherapy due to pancytopenia starting from cycle #4 with carboplatin  for an AUC of 4 and Alimta  400 mg/m.  Starting from cycle #5, Dr. Sherrod discontinued her chemotherapy.  Starting from cycle #6 she will  be on single agent maintenance Keytruda .  The patient was seen with Dr. Sherrod today.  Dr. Sherrod personally and independently reviewed the scan and discussed results with the patient today.  The scan showed no evidence of progression..  Dr. Sherrod recommends she continue on the same treatment at the same dose single agent Keytruda .    Labs were reviewed.  Recommend she proceed with cycle #13 today as scheduled with single agent keytruda .    She is ok to treat with her creatinine of 2.17 today.  Potassium is 2.8.  We will arrange for her to receive 10 mill equivalents of IV potassium while in the clinic today.  I also sent her potassium to take oral at home.  We will discuss the importance of being compliant with this as hypokalemia can cause arrhythmias.    I referred her to nephrology at a prior appointment. I previously referred to cardiology to establish care for monitoring and recommendations for the pericardial effusion.  We will contact their offices to follow-up on the referral.   The patient does have some underlying memory impairment.  She has not  been formally diagnosed with dementia.  She denies any signs and symptoms of infection at this time.  She seemed a little bit more disoriented when she first arrived at the clinic but improved.  Will arrange for UA and culture to ensure no urinary tract infection.  Her recent CT scan did not show any signs of infection.  She is not have any leukocytosis and her vitals are within normal limits.  Strongly encouraged them to make an appointment with her family doctor for dementia assessment.    We will see her back for follow-up visit in 3 weeks for evaluation and repeat blood work before undergoing cycle #14.  He had some diarrhea a few weeks ago which has since resolved.  She will monitor her symptoms for now.  We reviewed the instructions for Imodium dosing.  He has some anemia that appears to be worsening which may be related to the CKD but we will arrange for iron studies, B12, and ferritin drawn today to ensure no immune deficiency.  She was encouraged to start an iron supplement.  The patient was advised to call immediately if she has any concerning symptoms in the interval. The patient voices understanding of current disease status and treatment options and is in agreement with the current care plan. All questions were answered. The patient knows to call the clinic with any problems, questions or concerns. We can certainly see the patient much sooner if necessary   Orders Placed This Encounter  Procedures   Iron and Iron Binding Capacity ([facility] only)    Standing Status:   Future    Number of Occurrences:   1    Expected Date:   09/06/2024    Expiration Date:   09/06/2025   Ferritin    Standing Status:   Future    Number of Occurrences:   1    Expected Date:   09/06/2024    Expiration Date:   09/06/2025   Vitamin B12    Standing Status:   Future    Number of Occurrences:   1    Expected Date:   09/06/2024    Expiration Date:   09/06/2025   Folate    Standing Status:   Future     Number of Occurrences:   1    Expected Date:   09/06/2024    Expiration Date:  09/06/2025       Tami Pesce L Nyasia Baxley, PA-C 09/06/24  ADDENDUM: Hematology/Oncology Attending: I had a face-to-face encounter with the patient today.  I reviewed her records, lab, scan and recommended her care plan.  This is a very pleasant 78 years old female with stage IV non-small cell lung cancer, adenocarcinoma diagnosed in December 2024 with no actionable mutation but PD-L1 expression of 90% and high tumor mutational burden.  The patient started a course of palliative systemic chemoimmunotherapy initiated with carboplatin , Alimta  and Keytruda  every 3 weeks for 4 cycles and starting from cycle #5 she has been on maintenance treatment with Alimta  and Keytruda  and then starting from cycle #6 she has been on single agent Keytruda  every 3 weeks and Alimta  was discontinued secondary to renal insufficiency.  She is status post a total of 12 cycles.  The patient has been tolerating her treatment fairly well except for fatigue.  She had repeat CT scan of the chest, abdomen and pelvis performed recently.  I personally independently reviewed the scan and discussed the result with the patient and her son.  Her scan showed no concerning findings for disease progression and in general she has stable disease. I recommended for the patient to continue her current maintenance treatment with single agent Keytruda  and she will proceed with cycle #13 today. The patient was advised to call immediately if she has any other concerning symptoms in the interval. Disclaimer: This note was dictated with voice recognition software. Similar sounding words can inadvertently be transcribed and may be missed upon review. Sherrod MARLA Sherrod, MD

## 2024-09-06 NOTE — Patient Instructions (Signed)
 Hypokalemia Hypokalemia means that the amount of potassium in the blood is lower than normal. Potassium is a mineral (electrolyte) that helps regulate the amount of fluid in the body. It also stimulates muscle tightening (contraction) and helps nerves work properly. Normally, most of the body's potassium is inside cells, and only a very small amount is in the blood. Because the amount in the blood is so small, minor changes to potassium levels in the blood can be life-threatening. What are the causes? This condition may be caused by: Antibiotic medicine. Diarrhea or vomiting. Taking too much of a medicine that helps you have a bowel movement (laxative) can cause diarrhea and lead to hypokalemia. Chronic kidney disease (CKD). Medicines that help the body get rid of excess fluid (diuretics). Eating disorders, such as anorexia or bulimia. Low magnesium levels in the body. Sweating a lot. What are the signs or symptoms? Symptoms of this condition include: Weakness. Constipation. Fatigue. Muscle cramps. Mental confusion. Skipped heartbeats or irregular heartbeat (palpitations). Tingling or numbness. How is this diagnosed? This condition is diagnosed with a blood test. How is this treated? This condition may be treated by: Taking potassium supplements. Adjusting the medicines that you take. Eating more foods that contain a lot of potassium. If your potassium level is very low, you may need to get potassium through an IV and be monitored in the hospital. Follow these instructions at home: Eating and drinking  Eat a healthy diet. A healthy diet includes fresh fruits and vegetables, whole grains, healthy fats, and lean proteins. If told, eat more foods that contain a lot of potassium. These include: Nuts, such as peanuts and pistachios. Seeds, such as sunflower seeds and pumpkin seeds. Peas, lentils, and lima beans. Whole grain and bran cereals and breads. Fresh fruits and vegetables,  such as apricots, avocado, bananas, cantaloupe, kiwi, oranges, tomatoes, asparagus, and potatoes. Juices, such as orange, tomato, and prune. Lean meats, including fish. Milk and milk products, such as yogurt. General instructions Take over-the-counter and prescription medicines only as told by your health care provider. This includes vitamins, natural food products, and supplements. Keep all follow-up visits. This is important. Contact a health care provider if: You have weakness that gets worse. You feel your heart pounding or racing. You vomit. You have diarrhea. You have diabetes and you have trouble keeping your blood sugar in your target range. Get help right away if: You have chest pain. You have shortness of breath. You have vomiting or diarrhea that lasts for more than 2 days. You faint. These symptoms may be an emergency. Get help right away. Call 911. Do not wait to see if the symptoms will go away. Do not drive yourself to the hospital. Summary Hypokalemia means that the amount of potassium in the blood is lower than normal. This condition is diagnosed with a blood test. Hypokalemia may be treated by taking potassium supplements, adjusting the medicines that you take, or eating more foods that are high in potassium. If your potassium level is very low, you may need to get potassium through an IV and be monitored in the hospital. This information is not intended to replace advice given to you by your health care provider. Make sure you discuss any questions you have with your health care provider. Document Revised: 07/09/2021 Document Reviewed: 07/09/2021 Elsevier Patient Education  2024 [employer].

## 2024-09-06 NOTE — Telephone Encounter (Signed)
 Received a call from receptionist that pt is cancelling all appts for today. Will send scheduling a message to reschedule.

## 2024-09-06 NOTE — Telephone Encounter (Signed)
 Re-faxed both referrals to [facility] [phone])  and [facility] at [facility] [address] [phone]) with confirmation.

## 2024-09-07 ENCOUNTER — Ambulatory Visit: Payer: Self-pay

## 2024-09-07 LAB — URINE CULTURE

## 2024-09-07 NOTE — Telephone Encounter (Addendum)
 Spoke with patient's daughter, Ramona. Went over provider's comments from below. Ramona voiced understanding and did not have any further concerns.   ----- Message from Calton CROME Heilingoetter sent at 09/07/2024 10:31 AM EDT ----- Can you call daughter and let her know that it does not look like she a UTI ----- Message ----- From: Interface, Lab In [place] Sent: 09/06/2024   5:55 PM EDT To: Calton CROME Heilingoetter, PA-C

## 2024-09-10 ENCOUNTER — Other Ambulatory Visit: Payer: Self-pay

## 2024-09-13 ENCOUNTER — Other Ambulatory Visit: Payer: Self-pay

## 2024-09-17 ENCOUNTER — Telehealth: Payer: Self-pay | Admitting: *Deleted

## 2024-09-17 NOTE — Telephone Encounter (Signed)
 Spoke with daughter Clemencia called and states she was trying to get appt.s for referrals that [place] sent. Informed her [facility] and [facility] at [facility]. Phone numbers provided. Also stated pt. was having diarrhea approximately 2 times per Norell. Instructed her to increase fluids, take Immodium and Lomotil as prescribed and if stools increase to 4 per Runde or the pt. is dehydrated to call for a [facility] visit.

## 2024-09-25 ENCOUNTER — Other Ambulatory Visit: Payer: Self-pay | Admitting: *Deleted

## 2024-09-25 DIAGNOSIS — C3431 Malignant neoplasm of lower lobe, right bronchus or lung: Secondary | ICD-10-CM

## 2024-09-27 ENCOUNTER — Inpatient Hospital Stay

## 2024-09-27 ENCOUNTER — Encounter: Payer: Self-pay | Admitting: Internal Medicine

## 2024-09-27 ENCOUNTER — Inpatient Hospital Stay: Attending: Internal Medicine | Admitting: Internal Medicine

## 2024-09-27 VITALS — BP 115/72 | HR 57 | Temp 97.6°F | Resp 17 | Ht 64.0 in | Wt 142.0 lb

## 2024-09-27 VITALS — BP 105/70 | HR 54 | Resp 16

## 2024-09-27 DIAGNOSIS — J90 Pleural effusion, not elsewhere classified: Secondary | ICD-10-CM | POA: Insufficient documentation

## 2024-09-27 DIAGNOSIS — J984 Other disorders of lung: Secondary | ICD-10-CM | POA: Insufficient documentation

## 2024-09-27 DIAGNOSIS — M5134 Other intervertebral disc degeneration, thoracic region: Secondary | ICD-10-CM | POA: Insufficient documentation

## 2024-09-27 DIAGNOSIS — C3431 Malignant neoplasm of lower lobe, right bronchus or lung: Secondary | ICD-10-CM

## 2024-09-27 DIAGNOSIS — I3139 Other pericardial effusion (noninflammatory): Secondary | ICD-10-CM | POA: Insufficient documentation

## 2024-09-27 DIAGNOSIS — T451X5A Adverse effect of antineoplastic and immunosuppressive drugs, initial encounter: Secondary | ICD-10-CM | POA: Insufficient documentation

## 2024-09-27 DIAGNOSIS — Z87891 Personal history of nicotine dependence: Secondary | ICD-10-CM | POA: Insufficient documentation

## 2024-09-27 DIAGNOSIS — J8410 Pulmonary fibrosis, unspecified: Secondary | ICD-10-CM | POA: Insufficient documentation

## 2024-09-27 DIAGNOSIS — K521 Toxic gastroenteritis and colitis: Secondary | ICD-10-CM | POA: Insufficient documentation

## 2024-09-27 DIAGNOSIS — Z79899 Other long term (current) drug therapy: Secondary | ICD-10-CM | POA: Insufficient documentation

## 2024-09-27 DIAGNOSIS — D630 Anemia in neoplastic disease: Secondary | ICD-10-CM | POA: Insufficient documentation

## 2024-09-27 DIAGNOSIS — K828 Other specified diseases of gallbladder: Secondary | ICD-10-CM | POA: Insufficient documentation

## 2024-09-27 DIAGNOSIS — Z5112 Encounter for antineoplastic immunotherapy: Secondary | ICD-10-CM | POA: Insufficient documentation

## 2024-09-27 DIAGNOSIS — D6481 Anemia due to antineoplastic chemotherapy: Secondary | ICD-10-CM | POA: Insufficient documentation

## 2024-09-27 DIAGNOSIS — I119 Hypertensive heart disease without heart failure: Secondary | ICD-10-CM | POA: Insufficient documentation

## 2024-09-27 DIAGNOSIS — I700 Atherosclerosis of aorta: Secondary | ICD-10-CM | POA: Insufficient documentation

## 2024-09-27 DIAGNOSIS — D649 Anemia, unspecified: Secondary | ICD-10-CM

## 2024-09-27 DIAGNOSIS — I2510 Atherosclerotic heart disease of native coronary artery without angina pectoris: Secondary | ICD-10-CM | POA: Insufficient documentation

## 2024-09-27 DIAGNOSIS — C7972 Secondary malignant neoplasm of left adrenal gland: Secondary | ICD-10-CM | POA: Insufficient documentation

## 2024-09-27 DIAGNOSIS — Z87442 Personal history of urinary calculi: Secondary | ICD-10-CM | POA: Insufficient documentation

## 2024-09-27 DIAGNOSIS — N200 Calculus of kidney: Secondary | ICD-10-CM | POA: Insufficient documentation

## 2024-09-27 DIAGNOSIS — D61818 Other pancytopenia: Secondary | ICD-10-CM | POA: Insufficient documentation

## 2024-09-27 LAB — CMP (CANCER CENTER ONLY)
ALT: 7 U/L (ref 0–44)
AST: 20 U/L (ref 15–41)
Albumin: 3.6 g/dL (ref 3.5–5.0)
Alkaline Phosphatase: 63 U/L (ref 38–126)
Anion gap: 11 (ref 5–15)
BUN: 27 mg/dL — ABNORMAL HIGH (ref 8–23)
CO2: 20 mmol/L — ABNORMAL LOW (ref 22–32)
Calcium: 9.5 mg/dL (ref 8.9–10.3)
Chloride: 109 mmol/L (ref 98–111)
Creatinine: 2.15 mg/dL — ABNORMAL HIGH (ref 0.44–1.00)
GFR, Estimated: 23 mL/min — ABNORMAL LOW (ref >60)
Glucose, Bld: 85 mg/dL (ref 70–99)
Potassium: 3.8 mmol/L (ref 3.5–5.1)
Sodium: 140 mmol/L (ref 135–145)
Total Bilirubin: 0.4 mg/dL (ref 0.0–1.2)
Total Protein: 8 g/dL (ref 6.5–8.1)

## 2024-09-27 LAB — CBC WITH DIFFERENTIAL (CANCER CENTER ONLY)
Abs Immature Granulocytes: 0.02 K/uL (ref 0.00–0.07)
Basophils Absolute: 0 K/uL (ref 0.0–0.1)
Basophils Relative: 0 %
Eosinophils Absolute: 0 K/uL (ref 0.0–0.5)
Eosinophils Relative: 1 %
HCT: 27.8 % — ABNORMAL LOW (ref 36.0–46.0)
Hemoglobin: 8.8 g/dL — ABNORMAL LOW (ref 12.0–15.0)
Immature Granulocytes: 0 %
Lymphocytes Relative: 35 %
Lymphs Abs: 2.1 K/uL (ref 0.7–4.0)
MCH: 27 pg (ref 26.0–34.0)
MCHC: 31.7 g/dL (ref 30.0–36.0)
MCV: 85.3 fL (ref 80.0–100.0)
Monocytes Absolute: 0.9 K/uL (ref 0.1–1.0)
Monocytes Relative: 16 %
Neutro Abs: 2.8 K/uL (ref 1.7–7.7)
Neutrophils Relative %: 48 %
Platelet Count: 219 K/uL (ref 150–400)
RBC: 3.26 MIL/uL — ABNORMAL LOW (ref 3.87–5.11)
RDW: 16 % — ABNORMAL HIGH (ref 11.5–15.5)
WBC Count: 5.9 K/uL (ref 4.0–10.5)
nRBC: 0 % (ref 0.0–0.2)

## 2024-09-27 LAB — SAMPLE TO BLOOD BANK

## 2024-09-27 LAB — MAGNESIUM: Magnesium: 1.8 mg/dL (ref 1.7–2.4)

## 2024-09-27 MED ORDER — SODIUM CHLORIDE 0.9 % IV SOLN
200.0000 mg | Freq: Once | INTRAVENOUS | Status: AC
  Administered 2024-09-27: 200 mg via INTRAVENOUS
  Filled 2024-09-27: qty 8

## 2024-09-27 MED ORDER — SODIUM CHLORIDE 0.9 % IV SOLN: INTRAVENOUS | Status: DC

## 2024-09-27 MED ORDER — SODIUM CHLORIDE 0.9% FLUSH: 10.0000 mL | INTRAVENOUS | Status: DC | PRN

## 2024-09-27 NOTE — Patient Instructions (Signed)
 [facility] - A DEPT OF [facility]  Discharge Instructions: Thank you for choosing [facility] to provide your oncology and hematology care.   If you have a lab appointment with the Cancer Center, please go directly to the Cancer Center and check in at the registration area.   Wear comfortable clothing and clothing appropriate for easy access to any Portacath or PICC line.   We strive to give you quality time with your provider. You may need to reschedule your appointment if you arrive late (15 or more minutes).  Arriving late affects you and other patients whose appointments are after yours.  Also, if you miss three or more appointments without notifying the office, you may be dismissed from the clinic at the provider's discretion.      For prescription refill requests, have your pharmacy contact our office and allow 72 hours for refills to be completed.    Today you received the following chemotherapy and/or immunotherapy agents keytruda       To help prevent nausea and vomiting after your treatment, we encourage you to take your nausea medication as directed.  BELOW ARE SYMPTOMS THAT SHOULD BE REPORTED IMMEDIATELY: *FEVER GREATER THAN 100.4 F (38 C) OR HIGHER *CHILLS OR SWEATING *NAUSEA AND VOMITING THAT IS NOT CONTROLLED WITH YOUR NAUSEA MEDICATION *UNUSUAL SHORTNESS OF BREATH *UNUSUAL BRUISING OR BLEEDING *URINARY PROBLEMS (pain or burning when urinating, or frequent urination) *BOWEL PROBLEMS (unusual diarrhea, constipation, pain near the anus) TENDERNESS IN MOUTH AND THROAT WITH OR WITHOUT PRESENCE OF ULCERS (sore throat, sores in mouth, or a toothache) UNUSUAL RASH, SWELLING OR PAIN  UNUSUAL VAGINAL DISCHARGE OR ITCHING   Items with * indicate a potential emergency and should be followed up as soon as possible or go to the Emergency Department if any problems should occur.  Please show the CHEMOTHERAPY ALERT CARD or IMMUNOTHERAPY  ALERT CARD at check-in to the Emergency Department and triage nurse.  Should you have questions after your visit or need to cancel or reschedule your appointment, please contact [facility] - A DEPT OF [facility]  Dept: [phone]  and follow the prompts.  Office hours are 8:00 a.m. to 4:30 p.m. Monday - Friday. Please note that voicemails left after 4:00 p.m. may not be returned until the following business day.  We are closed weekends and major holidays. You have access to a nurse at all times for urgent questions. Please call the main number to the clinic Dept: [phone] and follow the prompts.   For any non-urgent questions, you may also contact your provider using MyChart. We now offer e-Visits for anyone 47 and older to request care online for non-urgent symptoms. For details visit [email or web address].   Also download the MyChart app! Go to the app store, search MyChart, open the app, select [place], and log in with your MyChart username and password.

## 2024-09-27 NOTE — Progress Notes (Signed)
 [facility] [phone]   [phone]  OFFICE PROGRESS NOTE  [name], MD [address] [place] [address]  DIAGNOSIS: stage IV (T4, N2, M1 B) non-small cell lung cancer, adenocarcinoma presented with large right infrahilar mass in addition to right hilar and mediastinal lymphadenopathy as well as left adrenal gland metastasis diagnosed in December 2024.   Biomarker Findings Tumor Mutational Burden - 24 Muts/Mb HRD signature - HRDsig Negative Microsatellite status - MS-Stable Genomic Findings For a complete list of the genes assayed, please refer to the Appendix. BRAF G464V KRAS G13C ASXL1 deletion exon 12 TP53 splice site [id number]>T 6 Disease relevant genes with no reportable alterations: ALK, EGFR, ERBB2, MET, RET, ROS1  PDL1 Expression: 90%  PRIOR THERAPY: None  CURRENT THERAPY: Systemic treatment with combination of chemoimmunotherapy with carboplatin  for AUC of 5, Alimta  500 Mg/M2 and Keytruda  200 Mg IV every 3 weeks.  First dose December 08, 2023.  Status post 13 cycles.  Starting from cycle #3 her dose of carboplatin  will be for AUC of 4 and Alimta  400 Mg/M2 secondary to pancytopenia.  Starting cycle #5 she was on maintenance treatment with Alimta  and Keytruda  every 3 weeks.  Starting from cycle #6 she is on treatment with single agent Keytruda  every 3 weeks.  Alimta  discontinued secondary to toxicity.  INTERVAL HISTORY: Tami Shea 78 y.o. female returns to the clinic today for follow-up visit accompanied by her daughter. Discussed the use of AI scribe software for clinical note transcription with the patient, who gave verbal consent to proceed.  History of Present Illness Tami Shea is a 78 year old female with stage four non-small cell lung cancer who presents for evaluation before starting cycle fourteen of maintenance Keytruda  therapy. She is accompanied by her daughter.  She has stage four non-small cell lung cancer,  adenocarcinoma, diagnosed in December 2023, with no actionable mutation and a PD-L1 expression of 90%. Initially, she received palliative systemic chemo-immunotherapy with carboplatin , Alimta , and Keytruda  every three weeks for four cycles. She is currently on maintenance treatment with single-agent Keytruda .  She experiences diarrhea, described as two to three watery stools per Chaviano for the past three weeks, with some improvement noted yesterday when she had only one stool. She has been using Imodium and Loperamide to manage the symptoms. Her daughter has been encouraging a bland diet and hydration with electrolytes to help manage the diarrhea.  She has anemia with a hemoglobin level of 8.8.    MEDICAL HISTORY: Past Medical History:  Diagnosis Date   Dyspnea    History of kidney stones    HTN (hypertension)    Smoker     ALLERGIES:  has no known allergies.  MEDICATIONS:  Current Outpatient Medications  Medication Sig Dispense Refill   amLODipine  (NORVASC ) 10 MG tablet Take 1 tablet (10 mg total) by mouth daily.     Cholecalciferol (VITAMIN D3) 1.25 MG (50000 UT) CAPS Take 1 capsule by mouth once a week.     gatifloxacin  (ZYMAXID ) 0.5 % SOLN Place 1 drop into the left eye 3 (three) times daily.     ketorolac  (ACULAR ) 0.5 % ophthalmic solution Place 1 drop into the left eye 2 (two) times daily.     lidocaine -prilocaine  (EMLA ) cream Apply to affected area once 30 g 3   loperamide (IMODIUM) 2 MG capsule Take 2 mg by mouth as needed for diarrhea or loose stools (pt has been taking daily).  ondansetron  (ZOFRAN ) 8 MG tablet Take 1 tablet (8 mg total) by mouth every 8 (eight) hours as needed for nausea or vomiting. Start on the third Picado after carboplatin . (Patient not taking: Reported on 09/06/2024) 30 tablet 1   [Paused] potassium chloride  SA (KLOR-CON  M) 20 MEQ tablet Take 1 tablet (20 mEq total) by mouth 2 (two) times daily. 14 tablet 0   potassium chloride  SA (KLOR-CON  M) 20 MEQ tablet  Take 1 tablet (20 mEq total) by mouth 2 (two) times daily. 18 tablet 0   prednisoLONE  acetate (PRED FORTE ) 1 % ophthalmic suspension Place 1 drop into the left eye 3 (three) times daily.     prochlorperazine  (COMPAZINE ) 10 MG tablet Take 1 tablet (10 mg total) by mouth every 6 (six) hours as needed for nausea or vomiting. 30 tablet 1   No current facility-administered medications for this visit.    SURGICAL HISTORY:  Past Surgical History:  Procedure Laterality Date   BRONCHIAL NEEDLE ASPIRATION BIOPSY  10/24/2023   Procedure: BRONCHIAL NEEDLE ASPIRATION BIOPSIES;  Surgeon: Brenna Adine CROME, DO;  Location: [facility];  Service: Pulmonary;;   COLONOSCOPY     INTRAMEDULLARY (IM) NAIL INTERTROCHANTERIC Right 07/27/2024   Procedure: FIXATION, FRACTURE, INTERTROCHANTERIC, WITH INTRAMEDULLARY ROD;  Surgeon: Fidel Rogue, MD;  Location: [facility];  Service: Orthopedics;  Laterality: Right;   IR IMAGING GUIDED PORT INSERTION  12/09/2023   PILONIDAL CYST EXCISION     tonisllectomy     VIDEO BRONCHOSCOPY WITH ENDOBRONCHIAL ULTRASOUND Right 10/24/2023   Procedure: VIDEO BRONCHOSCOPY WITH ENDOBRONCHIAL ULTRASOUND;  Surgeon: Brenna Adine CROME, DO;  Location: [facility];  Service: Pulmonary;  Laterality: Right;    REVIEW OF SYSTEMS:  A comprehensive review of systems was negative except for: Constitutional: positive for fatigue Gastrointestinal: positive for diarrhea   PHYSICAL EXAMINATION: General appearance: alert, cooperative, fatigued, and no distress Head: Normocephalic, without obvious abnormality, atraumatic Neck: no adenopathy, no JVD, supple, symmetrical, trachea midline, and thyroid  not enlarged, symmetric, no tenderness/mass/nodules Lymph nodes: Cervical, supraclavicular, and axillary nodes normal. Resp: clear to auscultation bilaterally Back: symmetric, no curvature. ROM normal. No CVA tenderness. Cardio: regular rate and rhythm, S1, S2 normal, no murmur, click, rub or gallop GI: soft,  non-tender; bowel sounds normal; no masses,  no organomegaly Extremities: extremities normal, atraumatic, no cyanosis or edema  ECOG PERFORMANCE STATUS: 1 - Symptomatic but completely ambulatory  Blood pressure 115/72, pulse (!) 57, temperature 97.6 F (36.4 C), temperature source Temporal, resp. rate 17, height 5' 4 (1.626 m), weight 142 lb (64.4 kg), SpO2 98%.  LABORATORY DATA: Lab Results  Component Value Date   WBC 5.9 09/27/2024   HGB 8.8 (L) 09/27/2024   HCT 27.8 (L) 09/27/2024   MCV 85.3 09/27/2024   PLT 219 09/27/2024      Chemistry      Component Value Date/Time   NA 141 09/06/2024 1119   NA 142 10/12/2021 1043   K 2.8 (L) 09/06/2024 1119   CL 107 09/06/2024 1119   CO2 24 09/06/2024 1119   BUN 24 (H) 09/06/2024 1119   BUN 17 10/12/2021 1043   CREATININE 2.17 (H) 09/06/2024 1119      Component Value Date/Time   CALCIUM 9.5 09/06/2024 1119   ALKPHOS 69 09/06/2024 1119   AST 16 09/06/2024 1119   ALT 6 09/06/2024 1119   BILITOT 0.5 09/06/2024 1119       RADIOGRAPHIC STUDIES: CT CHEST ABDOMEN PELVIS WO CONTRAST Result Date: 09/03/2024 CLINICAL DATA:  Metastatic disease evaluation, right  lower lobe adenocarcinoma * Tracking Code: BO * EXAM: CT CHEST, ABDOMEN AND PELVIS WITHOUT CONTRAST TECHNIQUE: Multidetector CT imaging of the chest, abdomen and pelvis was performed following the standard protocol without IV contrast. RADIATION DOSE REDUCTION: This exam was performed according to the departmental dose-optimization program which includes automated exposure control, adjustment of the mA and/or kV according to patient size and/or use of iterative reconstruction technique. COMPARISON:  06/28/2024 FINDINGS: CT CHEST FINDINGS Cardiovascular: Right chest port catheter. Aortic atherosclerosis. Cardiomegaly. Extensive three-vessel coronary artery calcifications. Unchanged large pericardial effusion. Mediastinum/Nodes: No enlarged mediastinal, hilar, or axillary lymph nodes.  Thyroid  gland, trachea, and esophagus demonstrate no significant findings. Lungs/Pleura: Unchanged post treatment appearance of the chest with treated mass and near complete atelectasis or consolidation of the right lower lobe as well as bandlike scarring and fibrosis in the suprahilar right upper lobe. Unchanged small loculated appearing right pleural effusion. Unchanged mass in the perihilar right lobe with heterogeneous internal fat attenuation, again most likely an incidentally present hamartoma (series 2, image 32). Unchanged tiny 0.2 cm nodule of the peripheral left lower lobe (series 6, image 83). Musculoskeletal: No chest wall abnormality. No acute osseous findings. CT ABDOMEN PELVIS FINDINGS Hepatobiliary: No solid liver abnormality is seen. Sludge contracted in the gallbladder. No gallstones, gallbladder wall thickening, or biliary dilatation. Pancreas: Unremarkable. No pancreatic ductal dilatation or surrounding inflammatory changes. Spleen: Normal in size without significant abnormality. Adrenals/Urinary Tract: Adrenal glands are unremarkable. Large nonobstructive calculus of the inferior pole of the left kidney. No right-sided calculi, ureteral calculi, or hydronephrosis. Bladder is unremarkable. Stomach/Bowel: Stomach is within normal limits. Appendix appears normal. No evidence of bowel wall thickening, distention, or inflammatory changes. Vascular/Lymphatic: Aortic atherosclerosis. No enlarged abdominal or pelvic lymph nodes. Reproductive: No mass or other abnormality. Other: No abdominal wall hernia.  Anasarca.  No ascites. Musculoskeletal: No acute osseous findings. Disc degenerative disease and bridging osteophytosis of the thoracic spine in keeping with DISH. Intramedullary nail fixation of the right hip. IMPRESSION: 1. Unchanged post treatment appearance of the chest with treated mass and near complete atelectasis or consolidation of the right lower lobe as well as bandlike scarring and fibrosis  in the suprahilar right upper lobe. 2. Unchanged small loculated appearing right pleural effusion. 3. Unchanged mass in the perihilar right lobe with heterogeneous internal fat attenuation, again most likely an incidentally present hamartoma. 4. Unchanged tiny 0.2 cm nodule of the peripheral left lower lobe, nonspecific and most likely benign and incidental. 5. No noncontrast evidence of lymphadenopathy or metastatic disease in the abdomen or pelvis. 6. Unchanged large pericardial effusion. 7. Cardiomegaly and coronary artery disease. 8. Nonobstructive left nephrolithiasis. 9. Aortic atherosclerosis. Aortic Atherosclerosis (ICD10-I70.0). Electronically Signed   By: Marolyn JONETTA Jaksch M.D.   On: 09/03/2024 07:29      ASSESSMENT AND PLAN: This is a very pleasant 78 years old African-American female with  stage IV (T4, N2, M1 B) non-small cell lung cancer, adenocarcinoma presented with large right infrahilar mass in addition to right hilar and mediastinal lymphadenopathy as well as left adrenal gland metastasis diagnosed in December 2024.  She had molecular studies by [facility] 1 that showed no actionable mutations and PD-L1 TPS of 90%. She is currently undergoing treatment with chemoimmunotherapy with carboplatin  for AUC of 5, Alimta  500 Mg/M2 and Keytruda  200 Mg IV every 3 weeks for 4 cycles followed by maintenance treatment with Alimta  and Keytruda .  Status post 13 cycle.  Starting from cycle #3 her dose of carboplatin  was reduced to AUC  of 4 and Alimta  400 Mg/M2 secondary to pancytopenia.  Starting cycle #6 she will be on single agent Keytruda  every 3 weeks.  Alimta  was discontinued secondary to intolerance and persistent pancytopenia. The patient has been tolerating this treatment fairly well with no concerning adverse effects except for the fatigue and diarrhea. Assessment and Plan Assessment & Plan Stage IV non-small cell lung adenocarcinoma on maintenance immunotherapy Stage IV non-small cell lung  adenocarcinoma diagnosed in December 2024 with no actionable mutation and PD-L1 expression of 90%. Currently on maintenance treatment with single-agent Keytruda  after completing 13 cycles of palliative systemic chemoimmunotherapy with carboplatin , Alimta , and Keytruda . - Proceeded with cycle number fourteen of maintenance treatment with Keytruda .  Chemotherapy-induced diarrhea Experiencing 2-3 watery stools per Sabine, with improvement to one stool on the Alvizo of the visit. Diarrhea likely related to chemotherapy. Hydration and dietary modifications discussed to manage symptoms. - Continue Imodium and Loperamide as needed. - Encouraged hydration with electrolytes such as Liquid IV. - Advised dietary modifications including mashed potatoes. - Will consider steroids if diarrhea exceeds six times a Masek.  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 No need for transfusion at this time. - Continue to monitor hemoglobin levels. The patient was advised to call immediately if she has any other concerning symptoms in the interval.  The patient voices understanding of current disease status and treatment options and is in agreement with the current care plan.  All questions were answered. The patient knows to call the clinic with any problems, questions or concerns. We can certainly see the patient much sooner if necessary.  The total time spent in the appointment was 20 minutes.  Disclaimer: This note was dictated with voice recognition software. Similar sounding words can inadvertently be transcribed and may not be corrected upon review.

## 2024-10-08 ENCOUNTER — Encounter: Payer: Self-pay | Admitting: Cardiovascular Disease

## 2024-10-08 ENCOUNTER — Ambulatory Visit: Attending: Cardiovascular Disease | Admitting: Cardiovascular Disease

## 2024-10-08 VITALS — BP 112/66 | HR 57 | Ht 65.0 in | Wt 136.6 lb

## 2024-10-08 DIAGNOSIS — E785 Hyperlipidemia, unspecified: Secondary | ICD-10-CM | POA: Insufficient documentation

## 2024-10-08 DIAGNOSIS — I3139 Other pericardial effusion (noninflammatory): Secondary | ICD-10-CM

## 2024-10-08 DIAGNOSIS — Z720 Tobacco use: Secondary | ICD-10-CM | POA: Insufficient documentation

## 2024-10-08 DIAGNOSIS — E782 Mixed hyperlipidemia: Secondary | ICD-10-CM

## 2024-10-08 DIAGNOSIS — I2510 Atherosclerotic heart disease of native coronary artery without angina pectoris: Secondary | ICD-10-CM | POA: Insufficient documentation

## 2024-10-08 DIAGNOSIS — I10 Essential (primary) hypertension: Secondary | ICD-10-CM | POA: Diagnosis not present

## 2024-10-08 NOTE — Patient Instructions (Signed)
 Medication Instructions:  Your physician recommends that you continue on your current medications as directed. Please refer to the Current Medication list given to you today.  *If you need a refill on your cardiac medications before your next appointment, please call your pharmacy*  Testing/Procedures: Your physician has requested that you have an echocardiogram. Echocardiography is a painless test that uses sound waves to create images of your heart. It provides your doctor with information about the size and shape of your heart and how well your heart's chambers and valves are working. This procedure takes approximately one hour. There are no restrictions for this procedure. Please do NOT wear cologne, perfume, aftershave, or lotions (deodorant is allowed). Please arrive 15 minutes prior to your appointment time.  Please note: We ask at that you not bring children with you during ultrasound (echo/ vascular) testing. Due to room size and safety concerns, children are not allowed in the ultrasound rooms during exams. Our front office staff cannot provide observation of children in our lobby area while testing is being conducted. An adult accompanying a patient to their appointment will only be allowed in the ultrasound room at the discretion of the ultrasound technician under special circumstances. We apologize for any inconvenience.   Dr. Court has ordered a CT coronary calcium score.   Test locations:  [facility] at [facility]  [facility] at [facility]  This is $99 out of pocket.   Coronary CalciumScan A coronary calcium scan is an imaging test used to look for deposits of calcium and other fatty materials (plaques) in the inner lining of the blood vessels of the heart (coronary arteries). These deposits of calcium and plaques can partly clog and narrow the coronary arteries without  producing any symptoms or warning signs. This puts a person at risk for a heart attack. This test can detect these deposits before symptoms develop. Tell a health care provider about: Any allergies you have. All medicines you are taking, including vitamins, herbs, eye drops, creams, and over-the-counter medicines. Any problems you or family members have had with anesthetic medicines. Any blood disorders you have. Any surgeries you have had. Any medical conditions you have. Whether you are pregnant or may be pregnant. What are the risks? Generally, this is a safe procedure. However, problems may occur, including: Harm to a pregnant woman and her unborn baby. This test involves the use of radiation. Radiation exposure can be dangerous to a pregnant woman and her unborn baby. If you are pregnant, you generally should not have this procedure done. Slight increase in the risk of cancer. This is because of the radiation involved in the test. What happens before the procedure? No preparation is needed for this procedure. What happens during the procedure? You will undress and remove any jewelry around your neck or chest. You will put on a hospital gown. Sticky electrodes will be placed on your chest. The electrodes will be connected to an electrocardiogram (ECG) machine to record a tracing of the electrical activity of your heart. A CT scanner will take pictures of your heart. During this time, you will be asked to lie still and hold your breath for 2-3 seconds while a picture of your heart is being taken. The procedure may vary among health care providers and hospitals. What happens after the procedure? You can get dressed. You can return to your normal activities. It is up to you to get  the results of your test. Ask your health care provider, or the department that is doing the test, when your results will be ready. Summary A coronary calcium scan is an imaging test used to look for deposits of  calcium and other fatty materials (plaques) in the inner lining of the blood vessels of the heart (coronary arteries). Generally, this is a safe procedure. Tell your health care provider if you are pregnant or may be pregnant. No preparation is needed for this procedure. A CT scanner will take pictures of your heart. You can return to your normal activities after the scan is done. This information is not intended to replace advice given to you by your health care provider. Make sure you discuss any questions you have with your health care provider. Document Released: 04/22/2008 Document Revised: 09/13/2016 Document Reviewed: 09/13/2016 Elsevier Interactive Patient Education  2017 [employer].   Follow-Up: At [facility], you and your health needs are our priority.  As part of our continuing mission to provide you with exceptional heart care, our providers are all part of one team.  This team includes your primary Cardiologist (physician) and Advanced Practice Providers or APPs (Physician Assistants and Nurse Practitioners) who all work together to provide you with the care you need, when you need it.  Your next appointment:   6 month(s)  Provider:   Dorn Lesches, MD

## 2024-10-08 NOTE — Assessment & Plan Note (Signed)
 50-pack-year tobacco abuse having quit 1 year ago when she was diagnosed with lung cancer.

## 2024-10-08 NOTE — Assessment & Plan Note (Signed)
 Three-vessel coronary calcification seen on chest CT 05/01/2024.  Will get a coronary calcium score to further evaluate.

## 2024-10-08 NOTE — Assessment & Plan Note (Signed)
 Chest CT performed 04/27/2024 showed a small pericardial effusion.  Her most recent chest CT performed 08/30/2024 showed a large pericardial effusion.  She does have metastatic cancer on immunotherapy.  She has no clinical stigmata of tamponade.  She denies shortness of breath.  She is not tachycardic nor does she have lower extremity edema.  I am going to get a 2D echo to further evaluate.

## 2024-10-08 NOTE — Assessment & Plan Note (Signed)
 History of essential hypertension blood pressure measured today at 112/66.  She is on amlodipine .

## 2024-10-08 NOTE — Progress Notes (Signed)
 10/08/2024 Tami Shea   1946-07-14  [id number]  Primary Physician [name], MD Primary Cardiologist: Dorn JINNY Lesches MD [facility] [place], [place]  HPI:  Tami Shea is a 78 y.o. thin-appearing divorced African-American female mother of 3, grandmother of 6 grandchildren who is accompanied by one of her daughters Tami Shea today.  She was referred by her ecology PA because of pericardial effusion seen on chest CT. 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  Her risk factors include family history of the father who died of a myocardial infarction at age 19.  She has 50-pack-year history tobacco abuse having quit 1 year ago as well as a history of treated hypertension and untreated hyperlipidemia.  She has never had a heart attack or stroke.  She denies chest pain or shortness of breath.  She was diagnosed with non-small cell lung cancer approxi-1 year ago with a large right infrahilar mass in addition to a right hilar and mediastinal lymphadenopathy as well as metastasis to left adrenal gland.  She was getting chemotherapy and now has been switched to immunotherapy.  Her other problems include moderate renal insufficiency.  Her most recent chest CT performed 08/30/2024 showed a large pericardial effusion with three-vessel coronary calcification.   Current Meds  Medication Sig   amLODipine  (NORVASC ) 10 MG tablet Take 1 tablet (10 mg total) by mouth daily.   aspirin  EC 81 MG tablet Take 81 mg by mouth daily. Swallow whole. (Patient taking differently: Take 81 mg by mouth in the morning and at bedtime. Swallow whole.)   Cholecalciferol (VITAMIN D3) 1.25 MG (50000 UT) CAPS Take 1 capsule by mouth once a week.   gatifloxacin  (ZYMAXID ) 0.5 % SOLN Place 1 drop into the left eye 3 (three) times daily.   ketorolac  (ACULAR ) 0.5 % ophthalmic solution Place 1 drop into the left eye 2 (two) times daily.   lidocaine -prilocaine  (EMLA ) cream Apply to affected area once   loperamide  (IMODIUM) 2 MG capsule Take 2 mg by mouth as needed for diarrhea or loose stools (pt has been taking daily).   ondansetron  (ZOFRAN ) 8 MG tablet Take 1 tablet (8 mg total) by mouth every 8 (eight) hours as needed for nausea or vomiting. Start on the third Sneed after carboplatin .   potassium chloride  SA (KLOR-CON  M) 20 MEQ tablet Take 1 tablet (20 mEq total) by mouth 2 (two) times daily.   potassium chloride  SA (KLOR-CON  M) 20 MEQ tablet Take 1 tablet (20 mEq total) by mouth 2 (two) times daily.   prochlorperazine  (COMPAZINE ) 10 MG tablet Take 1 tablet (10 mg total) by mouth every 6 (six) hours as needed for nausea or vomiting.     No Known Allergies  Social History   Socioeconomic History   Marital status: Divorced    Spouse name: Not on file   Number of children: Not on file   Years of education: Not on file   Highest education level: Not on file  Occupational History   Not on file  Tobacco Use   Smoking status: Former    Current packs/Franta: 0.00    Types: Cigarettes    Quit date: 09/24/2023    Years since quitting: 1.0   Smokeless tobacco: Never  Substance and Sexual Activity   Alcohol  use: Not Currently   Drug use: Not Currently    Types: Marijuana   Sexual activity: Not on file  Other Topics Concern   Not on file  Social History Narrative   Not on file   Social Drivers of Health   Financial Resource Strain: Not on file  Food Insecurity: No Food Insecurity (07/26/2024)   Hunger Vital Sign    Worried About Running Out of Food in the Last Year: Never true    Ran Out of Food in the Last Year: Never true  Transportation Needs: No Transportation Needs (07/26/2024)   PRAPARE - Administrator, Civil Service (Medical): No    Lack of Transportation (Non-Medical): No  Physical Activity: Not on file  Stress: Not on file  Social Connections: Moderately Integrated (07/26/2024)   Social Connection and Isolation Panel    Frequency of Communication with Friends and Family:  Three times a week    Frequency of Social Gatherings with Friends and Family: More than three times a week    Attends Religious Services: 1 to 4 times per year    Active Member of [employer] or Organizations: Yes    Attends Banker Meetings: 1 to 4 times per year    Marital Status: Divorced  Catering Manager Violence: Not At Risk (07/26/2024)   Humiliation, Afraid, Rape, and Kick questionnaire    Fear of Current or Ex-Partner: No    Emotionally Abused: No    Physically Abused: No    Sexually Abused: No     Review of Systems: General: negative for chills, fever, night sweats or weight changes.  Cardiovascular: negative for chest pain, dyspnea on exertion, edema, orthopnea, palpitations, paroxysmal nocturnal dyspnea or shortness of breath Dermatological: negative for rash Respiratory: negative for cough or wheezing Urologic: negative for hematuria Abdominal: negative for nausea, vomiting, diarrhea, bright red blood per rectum, melena, or hematemesis Neurologic: negative for visual changes, syncope, or dizziness All other systems reviewed and are otherwise negative except as noted above.    Blood pressure 112/66, pulse (!) 57, height 5' 5 (1.651 m), weight 136 lb 9.6 oz (62 kg), SpO2 98%.  General appearance: alert and no distress Neck: no adenopathy, no carotid bruit, no JVD, supple, symmetrical, trachea midline, and thyroid  not enlarged, symmetric, no tenderness/mass/nodules Lungs: clear to auscultation bilaterally Heart: regular rate and rhythm, S1, S2 normal, no murmur, click, rub or gallop Extremities: extremities normal, atraumatic, no cyanosis or edema Pulses: 2+ and symmetric Skin: Skin color, texture, turgor normal. No rashes or lesions Neurologic: Grossly normal  EKG EKG Interpretation Date/Time:  Monday October 08 2024 14:39:55 EST Ventricular Rate:  57 PR Interval:  204 QRS Duration:  114 QT Interval:  418 QTC Calculation: 406 R Axis:   47  Text  Interpretation: Sinus bradycardia with Fusion complexes Low voltage QRS Possible Inferior infarct (cited on or before 24-Oct-2023) When compared with ECG of 26-Jul-2024 22:46, Fusion complexes are now Present Right bundle branch block is no longer Present Confirmed by Court Carrier [phone]) on 10/08/2024 3:13:11 PM    ASSESSMENT AND PLAN:   Pericardial effusion Chest CT performed 04/27/2024 showed a small pericardial effusion.  Her most recent chest CT performed 08/30/2024 showed a large pericardial effusion.  She does have metastatic cancer on immunotherapy.  She has no clinical stigmata of tamponade.  She denies shortness of breath.  She is not tachycardic nor does she have lower extremity edema.  I am going to get a 2D echo to further evaluate.  Essential hypertension History of essential hypertension blood pressure measured today at 112/66.  She is on amlodipine .  Coronary artery calcification seen on CT scan Three-vessel coronary calcification  seen on chest CT 05/01/2024.  Will get a coronary calcium score to further evaluate.  Hyperlipidemia Her most recent lipid profile performed 09/10/2024 revealed total cholesterol 210, LDL 140 and HDL of 41.  I am going to get a coronary calcium score to restratify.  Tobacco abuse 50-pack-year tobacco abuse having quit 1 year ago when she was diagnosed with lung cancer.     Dorn DOROTHA Lesches MD FACP,FACC,FAHA, [facility] 10/08/2024 3:31 PM

## 2024-10-08 NOTE — Assessment & Plan Note (Signed)
 Her most recent lipid profile performed 09/10/2024 revealed total cholesterol 210, LDL 140 and HDL of 41.  I am going to get a coronary calcium score to restratify.

## 2024-10-11 ENCOUNTER — Other Ambulatory Visit (HOSPITAL_COMMUNITY): Payer: Self-pay | Admitting: Nephrology

## 2024-10-11 DIAGNOSIS — R809 Proteinuria, unspecified: Secondary | ICD-10-CM

## 2024-10-11 DIAGNOSIS — N184 Chronic kidney disease, stage 4 (severe): Secondary | ICD-10-CM

## 2024-10-11 DIAGNOSIS — I129 Hypertensive chronic kidney disease with stage 1 through stage 4 chronic kidney disease, or unspecified chronic kidney disease: Secondary | ICD-10-CM

## 2024-10-15 NOTE — Progress Notes (Unsigned)
 [facility] OFFICE PROGRESS NOTE  [name], MD [address] [place] [address]  DIAGNOSIS: Stage IV (T4, N2, M1 B) non-small cell lung cancer, adenocarcinoma presented with large right infrahilar mass in addition to right hilar and mediastinal lymphadenopathy as well as left adrenal gland metastasis diagnosed in December 2024.    Biomarker Findings Tumor Mutational Burden - 24 Muts/Mb HRD signature - HRDsig Negative Microsatellite status - MS-Stable Genomic Findings For a complete list of the genes assayed, please refer to the Appendix. BRAF G464V KRAS G13C ASXL1 deletion exon 12 TP53 splice site 920-2A>T 6 Disease relevant genes with no reportable alterations: ALK, EGFR, ERBB2, MET, RET, ROS1   PDL1 Expression: 90%  PRIOR THERAPY: None  CURRENT THERAPY: Systemic treatment with combination of chemoimmunotherapy with carboplatin  for AUC of 5, Alimta  500 Mg/M2 and Keytruda  200 Mg IV every 3 weeks.  First dose December 08, 2023.  Status post 14 cycles.  Starting from cycle #3 her dose of carboplatin  will be for AUC of 4 and Alimta  400 Mg/M2 secondary to pancytopenia.  Starting cycle #5 she was on maintenance treatment with Alimta  and Keytruda  every 3 weeks.  Starting from cycle #6 she will be on single agent Keytruda  every 3 weeks.  Alimta  discontinued secondary to toxicity.     INTERVAL HISTORY: Tami Shea 78 y.o. female returns to the clinic today for a follow-up visit accompanied by ***.   She does have some underlying memory impairment for which the patient's family was going to talk to about some memory care for her.    The patient did have a hip fracture in September 2025. She was then at [facility] skilled nursing facility for rehab. She has since returned home and lives with her daughter.   ***at her last appointment, she was endorsing imodium and loperamide.   She denies any unusual symptoms today. She denies any fever, chills, night sweats, or  unexplained weight loss. She does report a change in her taste with meat and has been adjusting her diet accordingly. She has been eating a lot of fruit sometimes exacerbates diarrhea. She had an episode of diarrhea for *** days about *** weeks ago with over *** loose bowel movements per Cwynar. Her diarrhea have subsided at this time. She denies any blood in the stool or any unusual abdominal pain at this time. She may have had mild nausea around the time of the diarrhea but did not need to use any of her antiemetics.   She denies any signs of infection including skin infections, urinary symptoms, or respiratory issues. She denies any shortness of breath, increased cough, or hemoptysis. She denies any rashes or skin changes.   The patient saw nephrology in the interval. She has had some renal dysfunction. They questioned whether related to ketyruda. Dr. Sherrod asked if ***. Kidney biopsy? Of note, the patient was also on alimta  before.   ***Also gets diarrhea which may affect creatinine.   ***worsening anemia. Iron supplement.   The patient was also seen by cardiology in the interval due to her pericardial effusion. They are arranging for echo.   The patient is scheduled for US  kidney biopsy on 11/10/23  She is here today for evaluation before undergoing cycle #15.      MEDICAL HISTORY: Past Medical History:  Diagnosis Date   Dyspnea    History of kidney stones    HTN (hypertension)    Smoker     ALLERGIES:  has no  known allergies.  MEDICATIONS:  Current Outpatient Medications  Medication Sig Dispense Refill   amLODipine  (NORVASC ) 10 MG tablet Take 1 tablet (10 mg total) by mouth daily.     aspirin  EC 81 MG tablet Take 81 mg by mouth daily. Swallow whole. (Patient taking differently: Take 81 mg by mouth in the morning and at bedtime. Swallow whole.)     Cholecalciferol (VITAMIN D3) 1.25 MG (50000 UT) CAPS Take 1 capsule by mouth once a week.     gatifloxacin  (ZYMAXID ) 0.5 % SOLN  Place 1 drop into the left eye 3 (three) times daily.     ketorolac  (ACULAR ) 0.5 % ophthalmic solution Place 1 drop into the left eye 2 (two) times daily.     lidocaine -prilocaine  (EMLA ) cream Apply to affected area once 30 g 3   loperamide (IMODIUM) 2 MG capsule Take 2 mg by mouth as needed for diarrhea or loose stools (pt has been taking daily).     ondansetron  (ZOFRAN ) 8 MG tablet Take 1 tablet (8 mg total) by mouth every 8 (eight) hours as needed for nausea or vomiting. Start on the third Stacy after carboplatin . 30 tablet 1   potassium chloride  SA (KLOR-CON  M) 20 MEQ tablet Take 1 tablet (20 mEq total) by mouth 2 (two) times daily. 14 tablet 0   potassium chloride  SA (KLOR-CON  M) 20 MEQ tablet Take 1 tablet (20 mEq total) by mouth 2 (two) times daily. 18 tablet 0   prochlorperazine  (COMPAZINE ) 10 MG tablet Take 1 tablet (10 mg total) by mouth every 6 (six) hours as needed for nausea or vomiting. 30 tablet 1   No current facility-administered medications for this visit.    SURGICAL HISTORY:  Past Surgical History:  Procedure Laterality Date   BRONCHIAL NEEDLE ASPIRATION BIOPSY  10/24/2023   Procedure: BRONCHIAL NEEDLE ASPIRATION BIOPSIES;  Surgeon: Brenna Adine CROME, DO;  Location: [facility];  Service: Pulmonary;;   COLONOSCOPY     INTRAMEDULLARY (IM) NAIL INTERTROCHANTERIC Right 07/27/2024   Procedure: FIXATION, FRACTURE, INTERTROCHANTERIC, WITH INTRAMEDULLARY ROD;  Surgeon: Fidel Rogue, MD;  Location: [facility] OR[facility];  Service: Orthopedics;  Laterality: Right;   IR IMAGING GUIDED PORT INSERTION  12/09/2023   PILONIDAL CYST EXCISION     tonisllectomy     VIDEO BRONCHOSCOPY WITH ENDOBRONCHIAL ULTRASOUND Right 10/24/2023   Procedure: VIDEO BRONCHOSCOPY WITH ENDOBRONCHIAL ULTRASOUND;  Surgeon: Brenna Adine CROME, DO;  Location: [facility];  Service: Pulmonary;  Laterality: Right;    REVIEW OF SYSTEMS:   Review of Systems  Constitutional: Negative for appetite change, chills, fatigue, fever  and unexpected weight change.  HENT:   Negative for mouth sores, nosebleeds, sore throat and trouble swallowing.   Eyes: Negative for eye problems and icterus.  Respiratory: Negative for cough, hemoptysis, shortness of breath and wheezing.   Cardiovascular: Negative for chest pain and leg swelling.  Gastrointestinal: Negative for abdominal pain, constipation, diarrhea, nausea and vomiting.  Genitourinary: Negative for bladder incontinence, difficulty urinating, dysuria, frequency and hematuria.   Musculoskeletal: Negative for back pain, gait problem, neck pain and neck stiffness.  Skin: Negative for itching and rash.  Neurological: Negative for dizziness, extremity weakness, gait problem, headaches, light-headedness and seizures.  Hematological: Negative for adenopathy. Does not bruise/bleed easily.  Psychiatric/Behavioral: Negative for confusion, depression and sleep disturbance. The patient is not nervous/anxious.     PHYSICAL EXAMINATION:  There were no vitals taken for this visit.  ECOG PERFORMANCE STATUS: {CHL ONC ECOG [id number]  Physical Exam  Constitutional: Oriented to person, place,  and time and well-developed, well-nourished, and in no distress. No distress.  HENT:  Head: Normocephalic and atraumatic.  Mouth/Throat: Oropharynx is clear and moist. No oropharyngeal exudate.  Eyes: Conjunctivae are normal. Right eye exhibits no discharge. Left eye exhibits no discharge. No scleral icterus.  Neck: Normal range of motion. Neck supple.  Cardiovascular: Normal rate, regular rhythm, normal heart sounds and intact distal pulses.   Pulmonary/Chest: Effort normal and breath sounds normal. No respiratory distress. No wheezes. No rales.  Abdominal: Soft. Bowel sounds are normal. Exhibits no distension and no mass. There is no tenderness.  Musculoskeletal: Normal range of motion. Exhibits no edema.  Lymphadenopathy:    No cervical adenopathy.  Neurological: Alert and oriented to  person, place, and time. Exhibits normal muscle tone. Gait normal. Coordination normal.  Skin: Skin is warm and dry. No rash noted. Not diaphoretic. No erythema. No pallor.  Psychiatric: Mood, memory and judgment normal.  Vitals reviewed.  LABORATORY DATA: Lab Results  Component Value Date   WBC 5.9 09/27/2024   HGB 8.8 (L) 09/27/2024   HCT 27.8 (L) 09/27/2024   MCV 85.3 09/27/2024   PLT 219 09/27/2024      Chemistry      Component Value Date/Time   NA 140 09/27/2024 1023   NA 142 10/12/2021 1043   K 3.8 09/27/2024 1023   CL 109 09/27/2024 1023   CO2 20 (L) 09/27/2024 1023   BUN 27 (H) 09/27/2024 1023   BUN 17 10/12/2021 1043   CREATININE 2.15 (H) 09/27/2024 1023      Component Value Date/Time   CALCIUM 9.5 09/27/2024 1023   ALKPHOS 63 09/27/2024 1023   AST 20 09/27/2024 1023   ALT 7 09/27/2024 1023   BILITOT 0.4 09/27/2024 1023       RADIOGRAPHIC STUDIES:  No results found.   ASSESSMENT/PLAN:  This is a very pleasant 78 year old African-American female with stage IV (T4, N2, M1 B) non-small cell lung cancer, adenocarcinoma.  She presented with a large right infrahilar mass in addition to right hilar and mediastinal lymphadenopathy as well as a left adrenal metastasis.  She was diagnosed in December 2024.  She had molecular studies performed by [facility] that showed no actionable mutations.  Her PD-L1 expression is 90%.   The patient is currently undergoing chemotherapy and immunotherapy with carboplatin  for an AUC of 5, Alimta  500 mg/m, Keytruda  200 mg IV every 3 weeks.  She is status post 14 cycles.  Starting from cycle #3, Dr. Sherrod reduced the dose of her chemotherapy due to pancytopenia starting from cycle #4 with carboplatin  for an AUC of 4 and Alimta  400 mg/m.  Starting from cycle #5, Dr. Sherrod discontinued her chemotherapy.  Starting from cycle #6 she will be on single agent maintenance Keytruda .   The patient was seen with Dr. Sherrod. The patient is  scheduled for renal biopsy on 11/10/23. Of note, the patient was on alimta  in the past.   Labs were reviewed.  Recommend she *** with cycle #15 today as scheduled with single agent keytruda .    She is ok to treat with her creatinine of *** today.  Potassium is ***.  We will arrange for her to receive *** mill equivalents of IV potassium while in the clinic today.  ***I also sent her potassium to take oral at home.  We will discuss the importance of being compliant with this as hypokalemia can cause arrhythmias.     ***I referred her to nephrology at a prior  appointment. I previously referred to cardiology to establish care for monitoring and recommendations for the pericardial effusion.  We will contact their offices to follow-up on the referral. ***     We will see her back for follow-up visit in 3 weeks for evaluation and repeat blood work before undergoing cycle #15.    ***He had some diarrhea a few weeks ago which has since resolved.  She will monitor her symptoms for now.  We reviewed the instructions for Imodium dosing.   He has some anemia that appears to be worsening which may be related to the CKD but we will arrange for iron studies, B12, and ferritin drawn today to ensure no immune deficiency.   She was encouraged to start an iron supplement.  The patient was advised to call immediately if she has any concerning symptoms in the interval. The patient voices understanding of current disease status and treatment options and is in agreement with the current care plan. All questions were answered. The patient knows to call the clinic with any problems, questions or concerns. We can certainly see the patient much sooner if necessary     No orders of the defined types were placed in this encounter.    I spent {CHL ONC TIME VISIT - DTPQU:[id number]} counseling the patient face to face. The total time spent in the appointment was {CHL ONC TIME VISIT - DTPQU:[id number]}.  Elanie Hammitt L  Julies Carmickle, PA-C 10/15/24

## 2024-10-16 ENCOUNTER — Telehealth: Payer: Self-pay

## 2024-10-16 ENCOUNTER — Telehealth: Payer: Self-pay | Admitting: Medical Oncology

## 2024-10-16 NOTE — Telephone Encounter (Signed)
 Dtr called about FMLA paperwork . I forwarded her call to [facility] department .

## 2024-10-16 NOTE — Telephone Encounter (Signed)
 Return patient phone call, but was unable to leave message due to mail box being full.

## 2024-10-17 ENCOUNTER — Telehealth: Payer: Self-pay | Admitting: *Deleted

## 2024-10-17 NOTE — Telephone Encounter (Signed)
 Voicemail received yesterday from Annslee Tercero Poppe's daughter Clemencia Strawder [phone]).  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  I dropped off FMLA a week and a half ago.  How long does it take. Returned call.  Message left on voicemail to call this nurse directly.  No form received to log to [email or web address]

## 2024-10-18 ENCOUNTER — Inpatient Hospital Stay: Attending: Internal Medicine

## 2024-10-18 ENCOUNTER — Telehealth: Payer: Self-pay

## 2024-10-18 ENCOUNTER — Other Ambulatory Visit: Payer: Self-pay | Admitting: Physician Assistant

## 2024-10-18 ENCOUNTER — Inpatient Hospital Stay: Admitting: Physician Assistant

## 2024-10-18 DIAGNOSIS — Z87891 Personal history of nicotine dependence: Secondary | ICD-10-CM | POA: Diagnosis not present

## 2024-10-18 DIAGNOSIS — C7972 Secondary malignant neoplasm of left adrenal gland: Secondary | ICD-10-CM | POA: Insufficient documentation

## 2024-10-18 DIAGNOSIS — C3431 Malignant neoplasm of lower lobe, right bronchus or lung: Secondary | ICD-10-CM

## 2024-10-18 DIAGNOSIS — R110 Nausea: Secondary | ICD-10-CM | POA: Insufficient documentation

## 2024-10-18 DIAGNOSIS — K521 Toxic gastroenteritis and colitis: Secondary | ICD-10-CM | POA: Diagnosis not present

## 2024-10-18 DIAGNOSIS — N179 Acute kidney failure, unspecified: Secondary | ICD-10-CM | POA: Insufficient documentation

## 2024-10-18 DIAGNOSIS — F0390 Unspecified dementia without behavioral disturbance: Secondary | ICD-10-CM | POA: Diagnosis not present

## 2024-10-18 DIAGNOSIS — R7989 Other specified abnormal findings of blood chemistry: Secondary | ICD-10-CM | POA: Insufficient documentation

## 2024-10-18 DIAGNOSIS — D649 Anemia, unspecified: Secondary | ICD-10-CM

## 2024-10-18 LAB — CBC WITH DIFFERENTIAL (CANCER CENTER ONLY)
Abs Immature Granulocytes: 0.05 K/uL (ref 0.00–0.07)
Basophils Absolute: 0 K/uL (ref 0.0–0.1)
Basophils Relative: 0 %
Eosinophils Absolute: 0.1 K/uL (ref 0.0–0.5)
Eosinophils Relative: 2 %
HCT: 26.3 % — ABNORMAL LOW (ref 36.0–46.0)
Hemoglobin: 8.6 g/dL — ABNORMAL LOW (ref 12.0–15.0)
Immature Granulocytes: 1 %
Lymphocytes Relative: 28 %
Lymphs Abs: 1.9 K/uL (ref 0.7–4.0)
MCH: 27 pg (ref 26.0–34.0)
MCHC: 32.7 g/dL (ref 30.0–36.0)
MCV: 82.7 fL (ref 80.0–100.0)
Monocytes Absolute: 0.8 K/uL (ref 0.1–1.0)
Monocytes Relative: 12 %
Neutro Abs: 3.9 K/uL (ref 1.7–7.7)
Neutrophils Relative %: 57 %
Platelet Count: 181 K/uL (ref 150–400)
RBC: 3.18 MIL/uL — ABNORMAL LOW (ref 3.87–5.11)
RDW: 15.9 % — ABNORMAL HIGH (ref 11.5–15.5)
WBC Count: 6.7 K/uL (ref 4.0–10.5)
nRBC: 0 % (ref 0.0–0.2)

## 2024-10-18 LAB — CMP (CANCER CENTER ONLY)
ALT: 16 U/L (ref 0–44)
AST: 31 U/L (ref 15–41)
Albumin: 3.7 g/dL (ref 3.5–5.0)
Alkaline Phosphatase: 77 U/L (ref 38–126)
Anion gap: 11 (ref 5–15)
BUN: 26 mg/dL — ABNORMAL HIGH (ref 8–23)
CO2: 23 mmol/L (ref 22–32)
Calcium: 9.7 mg/dL (ref 8.9–10.3)
Chloride: 108 mmol/L (ref 98–111)
Creatinine: 2.34 mg/dL — ABNORMAL HIGH (ref 0.44–1.00)
GFR, Estimated: 21 mL/min — ABNORMAL LOW (ref >60)
Glucose, Bld: 106 mg/dL — ABNORMAL HIGH (ref 70–99)
Potassium: 3.4 mmol/L — ABNORMAL LOW (ref 3.5–5.1)
Sodium: 142 mmol/L (ref 135–145)
Total Bilirubin: 0.5 mg/dL (ref 0.0–1.2)
Total Protein: 8.3 g/dL — ABNORMAL HIGH (ref 6.5–8.1)

## 2024-10-18 LAB — SAMPLE TO BLOOD BANK

## 2024-10-18 LAB — TSH: TSH: 1.89 u[IU]/mL (ref 0.350–4.500)

## 2024-10-18 MED ORDER — SODIUM CHLORIDE 0.9 % IV SOLN: Freq: Once | INTRAVENOUS | Status: AC

## 2024-10-18 NOTE — Patient Instructions (Signed)
Rehydration  Rehydration is the replacement of fluids, salts, and minerals in the body (electrolytes) that are lost during dehydration. Dehydration is when there is not enough water or other fluids in the body. This happens when you lose more fluids than you take in. People who are age 78 or older have a higher risk of dehydration than younger adults. This is because in older age, the body: Is less able to maintain the right amount of water. Does not respond to temperature changes as well. Does not get a sense of thirst as easily or quickly. Other causes include: Not drinking enough fluids. This can occur when you are ill, when you forget to drink, or when you are doing activities that require a lot of energy, especially in hot weather. Conditions that cause loss of water or other fluids. These include diarrhea, vomiting, sweating, or urinating a lot. Other illnesses, such as fever or infection. Certain medicines, such as those that remove excess fluid from the body (diuretics). Symptoms of mild or moderate dehydration may include thirst, dry lips and mouth, and dizziness. Symptoms of severe dehydration may include increased heart rate, confusion, fainting, and not urinating. In severe cases, you may need to get fluids through an IV at the hospital. For mild or moderate cases, you can usually rehydrate at home by drinking certain fluids as told by your health care provider. What are the risks? Rehydration is usually safe. Taking in too much fluid (overhydration) can be a problem but is rare. Overhydration can cause an imbalance of electrolytes in the body, kidney failure, fluid in the lungs, or a decrease in salt (sodium) levels in the body. Supplies needed: You will need an oral rehydration solution (ORS) if your health care provider tells you to use one. This is a drink to treat dehydration. It can be found in pharmacies and retail stores. How to rehydrate Fluids Follow instructions from your  health care provider about what to drink. The kind of fluid and the amount you should drink depend on your condition. In general, you should choose drinks that you prefer. If told by your health care provider, drink an ORS. Make an ORS by following instructions on the package. Start by drinking small amounts, about  cup (120 mL) every 5-10 minutes. Slowly increase how much you drink until you have taken in the amount recommended by your health care provider. Drink enough clear fluids to keep your urine pale yellow. If you were told to drink an ORS, finish it first, then start slowly drinking other clear fluids. Drink fluids such as: Water. This includes sparkling and flavored water. Drinking only water can lead to having too little sodium in your body (hyponatremia). Follow the advice of your health care provider. Water from ice chips you suck on. Fruit juice with water added to it(diluted). Sports drinks. Hot or cold herbal teas. Broth-based soups. Coffee. Milk or milk products. Food Follow instructions from your health care provider about what to eat while you rehydrate. Your health care provider may recommend that you slowly begin eating regular foods in small amounts. Eat foods that contain a healthy balance of electrolytes, such as bananas, oranges, potatoes, tomatoes, and spinach. Avoid foods that are greasy or contain a lot of sugar. In some cases, you may get nutrition through a feeding tube that is passed through your nose and into your stomach (nasogastric tube, or NG tube). This may be done if you have uncontrolled vomiting or diarrhea. Drinks to avoid  Certain  drinks may make dehydration worse. While you rehydrate, avoid drinking alcohol. How to tell if you are recovering from dehydration You may be getting better if: You are urinating more often than before you started rehydrating. Your urine is pale yellow. Your energy level improves. You vomit less often. You have diarrhea  less often. Your appetite improves or returns to normal. You feel less dizzy or light-headed. Your skin tone and color start to look more normal. Follow these instructions at home: Take over-the-counter and prescription medicines only as told by your health care provider. Do not take sodium tablets. Doing this can lead to having too much sodium in your body (hypernatremia). Contact a health care provider if: You continue to have symptoms of mild or moderate dehydration, such as: Thirst. Dry lips. Slightly dry mouth. Dizziness. Dark urine or less urine than usual. Muscle cramps. You continue to vomit or have diarrhea. Get help right away if: You have symptoms of dehydration that get worse. You have a fever. You have a severe headache. You have been vomiting and have problems, such as: Your vomiting gets worse. Your vomit includes blood or green matter (bile). You cannot eat or drink without vomiting. You have problems with urination or bowel movements, such as: Diarrhea that gets worse. Blood in your stool (feces). This may cause stool to look black and tarry. Not urinating, or urinating only a small amount of very dark urine, within 6-8 hours. You have trouble breathing. You have symptoms that get worse with treatment. These symptoms may be an emergency. Get help right away. Call 911. Do not wait to see if the symptoms will go away. Do not drive yourself to the hospital. This information is not intended to replace advice given to you by your health care provider. Make sure you discuss any questions you have with your health care provider. Document Revised: 03/10/2022 Document Reviewed: 03/08/2022 Elsevier Patient Education  2024 [employer].

## 2024-10-18 NOTE — Telephone Encounter (Signed)
 Spoke with patients daughter, Ramona in regards to patients visit today.  Informed Ramona a summary of today's visit.  She voiced understanding.

## 2024-10-19 LAB — T4: T4, Total: 6.6 ug/dL (ref 4.5–12.0)

## 2024-10-29 ENCOUNTER — Other Ambulatory Visit: Payer: Self-pay

## 2024-10-29 ENCOUNTER — Other Ambulatory Visit (HOSPITAL_COMMUNITY): Payer: Self-pay | Admitting: Student

## 2024-10-29 DIAGNOSIS — R7989 Other specified abnormal findings of blood chemistry: Secondary | ICD-10-CM

## 2024-10-30 ENCOUNTER — Telehealth: Payer: Self-pay

## 2024-10-30 ENCOUNTER — Ambulatory Visit

## 2024-10-30 NOTE — Telephone Encounter (Signed)
 Patient for US  guided Renal Biopsy on Wed 10/31/24, I called and spoke with the patient's daughter, Clemencia on the phone and gave pre-procedure instructions. Ramona was made aware to have the patient here at 9:30a, last dose of ASA 81mg  was Thurs 10/25/24, NPO after MN prior to procedure as well as driver post procedure/recovery/discharge. Ramona stated understanding.  Called 10/30/24

## 2024-10-30 NOTE — H&P (Signed)
 "     Chief Complaint: Patient was seen in consultation today for proteinuria  Referring Physician(s): Tami Shea  Supervising Physician: Tami Shea  Patient Status: [facility] - Out-pt  History of Present Illness: Tami Shea is a 78 y.o. female with a past medical history significant for tobacco use, anemia, HTN, left upper lobe non-small cell lung cancer currently undergoing palliative systemic therapy, CKD who presents today for a random renal biopsy due to worsening proteinuria. Tami Shea was referred to nephrology in November of this year due to progressively worsening renal function noted during her regular oncology follow ups. She was seen by nephrology who felt that the proteinuria was likely secondary to Keytruda  causing renal damage and tubular protein excretion. She has been referred to IR for a random renal biopsy to further direct care.  Past Medical History:  Diagnosis Date   Dyspnea    History of kidney stones    HTN (hypertension)    Smoker     Past Surgical History:  Procedure Laterality Date   BRONCHIAL NEEDLE ASPIRATION BIOPSY  10/24/2023   Procedure: BRONCHIAL NEEDLE ASPIRATION BIOPSIES;  Surgeon: Tami Shea;  Location: [facility];  Service: Pulmonary;;   COLONOSCOPY     INTRAMEDULLARY (IM) NAIL INTERTROCHANTERIC Right 07/27/2024   Procedure: FIXATION, FRACTURE, INTERTROCHANTERIC, WITH INTRAMEDULLARY ROD;  Surgeon: Tami Rogue, Shea;  Location: [facility];  Service: Orthopedics;  Laterality: Right;   IR IMAGING GUIDED PORT INSERTION  12/09/2023   PILONIDAL CYST EXCISION     tonisllectomy     VIDEO BRONCHOSCOPY WITH ENDOBRONCHIAL ULTRASOUND Right 10/24/2023   Procedure: VIDEO BRONCHOSCOPY WITH ENDOBRONCHIAL ULTRASOUND;  Surgeon: Tami Shea;  Location: [facility];  Service: Pulmonary;  Laterality: Right;    Allergies: Patient has no known allergies.  Medications: Prior to Admission medications  Medication Sig Start Date End Date Taking?  Authorizing Provider  amLODipine  (NORVASC ) 10 MG tablet Take 1 tablet (10 mg total) by mouth daily. 08/02/24   Tami Shea  aspirin  EC 81 MG tablet Take 81 mg by mouth daily. Swallow whole. Patient taking differently: Take 81 mg by mouth in the morning and at bedtime. Swallow whole.    Provider, Historical, Shea  Cholecalciferol (VITAMIN D3) 1.25 MG (50000 UT) CAPS Take 1 capsule by mouth once a week. 10/03/23   Provider, Historical, Shea  gatifloxacin  (ZYMAXID ) 0.5 % SOLN Place 1 drop into the left eye 3 (three) times daily. 07/02/24   Provider, Historical, Shea  ketorolac  (ACULAR ) 0.5 % ophthalmic solution Place 1 drop into the left eye 2 (two) times daily. 07/02/24   Provider, Historical, Shea  lidocaine -prilocaine  (EMLA ) cream Apply to affected area once 12/01/23   Tami Shea  loperamide (IMODIUM) 2 MG capsule Take 2 mg by mouth as needed for diarrhea or loose stools (pt has been taking daily).    Provider, Historical, Shea  ondansetron  (ZOFRAN ) 8 MG tablet Take 1 tablet (8 mg total) by mouth every 8 (eight) hours as needed for nausea or vomiting. Start on the third Kuehnel after carboplatin . 12/01/23   Tami Shea  potassium chloride  SA (KLOR-CON  M) 20 MEQ tablet Take 1 tablet (20 mEq total) by mouth 2 (two) times daily. 07/26/24   Tami Shea  potassium chloride  SA (KLOR-CON  M) 20 MEQ tablet Take 1 tablet (20 mEq total) by mouth 2 (two) times daily. 09/06/24   Tami Shea  prochlorperazine  (COMPAZINE ) 10 MG tablet Take 1 tablet (10 mg total) by  mouth every 6 (six) hours as needed for nausea or vomiting. 12/01/23   Tami Shea     Family History  Problem Relation Age of Onset   Kidney disease Mother    Heart disease Father     Social History   Socioeconomic History   Marital status: Divorced    Spouse name: Not on file   Number of children: Not on file   Years of education: Not on file   Highest education level: Not on file  Occupational  History   Not on file  Tobacco Use   Smoking status: Former    Current packs/Premo: 0.00    Types: Cigarettes    Quit date: 09/24/2023    Years since quitting: 1.1   Smokeless tobacco: Never  Substance and Sexual Activity   Alcohol  use: Not Currently   Drug use: Not Currently    Types: Marijuana   Sexual activity: Not on file  Other Topics Concern   Not on file  Social History Narrative   Not on file   Social Drivers of Health   Tobacco Use: Medium Risk (10/08/2024)   Patient History    Smoking Tobacco Use: Former    Smokeless Tobacco Use: Never    Passive Exposure: Not on Actuary Strain: Not on file  Food Insecurity: No Food Insecurity (07/26/2024)   Epic    Worried About Programme Researcher, Broadcasting/film/video in the Last Year: Never true    Ran Out of Food in the Last Year: Never true  Transportation Needs: No Transportation Needs (07/26/2024)   Epic    Lack of Transportation (Medical): No    Lack of Transportation (Non-Medical): No  Physical Activity: Not on file  Stress: Not on file  Social Connections: Moderately Integrated (07/26/2024)   Social Connection and Isolation Panel    Frequency of Communication with Friends and Family: Three times a week    Frequency of Social Gatherings with Friends and Family: More than three times a week    Attends Religious Services: 1 to 4 times per year    Active Member of Clubs or Organizations: Yes    Attends Banker Meetings: 1 to 4 times per year    Marital Status: Divorced  Depression (PHQ2-9): Low Risk (07/26/2024)   Depression (PHQ2-9)    PHQ-2 Score: 2  Alcohol  Screen: Not on file  Housing: Low Risk (07/26/2024)   Epic    Unable to Pay for Housing in the Last Year: No    Number of Times Moved in the Last Year: 0    Homeless in the Last Year: No  Utilities: Not At Risk (07/26/2024)   Epic    Threatened with loss of utilities: No  Health Literacy: Not on file     Review of Systems: A 12 point ROS discussed and  pertinent positives are indicated in the HPI above.  All other systems are negative.  Review of Systems  Constitutional:  Negative for chills and fever.  Respiratory:  Negative for cough and shortness of breath.   Cardiovascular:  Negative for chest pain.  Gastrointestinal:  Positive for diarrhea (chronic 2/2 chemotherapy). Negative for abdominal pain, nausea and vomiting.  Musculoskeletal:  Negative for back pain.  Neurological:  Negative for dizziness and headaches.    Vital Signs: BP 127/74   Pulse 62   Temp 97.9 F (36.6 C) (Temporal)   Resp (!) 25   Ht 5' 5 (1.651 m)   Wt 133 lb  14.4 oz (60.7 kg)   SpO2 99%   BMI 22.28 kg/m   Physical Exam Vitals reviewed.  Constitutional:      General: She is not in acute distress. HENT:     Mouth/Throat:     Mouth: Mucous membranes are moist.     Pharynx: Oropharynx is clear. No oropharyngeal exudate or posterior oropharyngeal erythema.  Cardiovascular:     Rate and Rhythm: Normal rate and regular rhythm.  Pulmonary:     Effort: Pulmonary effort is normal.     Breath sounds: Normal breath sounds.  Abdominal:     General: There is no distension.     Palpations: Abdomen is soft.     Tenderness: There is no abdominal tenderness.  Skin:    General: Skin is warm and dry.  Neurological:     Mental Status: She is alert and oriented to person, place, and time.  Psychiatric:        Mood and Affect: Mood normal.        Behavior: Behavior normal.        Thought Content: Thought content normal.        Judgment: Judgment normal.      Shea Evaluation Airway: WNL Heart: WNL Abdomen: WNL Chest/ Lungs: WNL ASA  Classification: 3 Mallampati/Airway Score: Two   Imaging: No results found.  Labs:  CBC: Recent Labs    08/16/24 1006 09/06/24 1119 09/27/24 1023 10/18/24 0950  WBC 6.4 4.9 5.9 6.7  HGB 9.1* 9.4* 8.8* 8.6*  HCT 28.6* 29.0* 27.8* 26.3*  PLT 240 191 219 181    COAGS: Recent Labs    07/26/24 1759  INR 1.1     BMP: Recent Labs    08/16/24 1006 09/06/24 1119 09/27/24 1023 10/18/24 0950  NA 141 141 140 142  K 4.1 2.8* 3.8 3.4*  CL 107 107 109 108  CO2 29 24 20* 23  GLUCOSE 86 96 85 106*  BUN 26* 24* 27* 26*  CALCIUM 9.6 9.5 9.5 9.7  CREATININE 2.07* 2.17* 2.15* 2.34*  GFRNONAA 24* 23* 23* 21*    LIVER FUNCTION TESTS: Recent Labs    08/16/24 1006 09/06/24 1119 09/27/24 1023 10/18/24 0950  BILITOT 0.5 0.5 0.4 0.5  AST 19 16 20 31   ALT 7 6 7 16   ALKPHOS 79 69 63 77  PROT 8.0 8.5* 8.0 8.3*  ALBUMIN 3.2* 3.6 3.6 3.7    TUMOR MARKERS: No results for input(s): AFPTM, CEA, CA199, CHROMGRNA in the last 8760 hours.  Assessment and Plan:  78 y/o F with history of stage IV NSCLC currently undergoing palliative systemic therapy recently noted to have worsening proteinuria felt likely secondary to systemic treatments who presents today for a random renal biopsy to further direct care.   Risks and benefits of random renal biopsy was discussed with the patient and/or patient's family including, but not limited to bleeding, infection, damage to adjacent structures or low yield requiring additional tests.  All of the questions were answered and there is agreement to proceed.  Consent signed and in chart.  Thank you for this interesting consult.  I greatly enjoyed meeting Tami Shea and look forward to participating in their care.  A copy of this report was sent to the requesting provider on this date.  Electronically Signed: Clotilda DELENA Hesselbach, Shea 10/30/2024, 9:45 AM   I spent a total of  30 Minutes  in face to face in clinical consultation, greater than 50% of which was counseling/coordinating care  for proteinuria.  "

## 2024-10-31 ENCOUNTER — Ambulatory Visit
Admission: RE | Admit: 2024-10-31 | Discharge: 2024-10-31 | Disposition: A | Discharge status: Home / Self Care | Source: Ambulatory Visit | Attending: Nephrology | Admitting: Nephrology

## 2024-10-31 ENCOUNTER — Other Ambulatory Visit: Payer: Self-pay

## 2024-10-31 DIAGNOSIS — Z7982 Long term (current) use of aspirin: Secondary | ICD-10-CM | POA: Insufficient documentation

## 2024-10-31 DIAGNOSIS — I129 Hypertensive chronic kidney disease with stage 1 through stage 4 chronic kidney disease, or unspecified chronic kidney disease: Secondary | ICD-10-CM | POA: Diagnosis not present

## 2024-10-31 DIAGNOSIS — R7989 Other specified abnormal findings of blood chemistry: Secondary | ICD-10-CM | POA: Diagnosis not present

## 2024-10-31 DIAGNOSIS — N189 Chronic kidney disease, unspecified: Secondary | ICD-10-CM | POA: Insufficient documentation

## 2024-10-31 DIAGNOSIS — Z85118 Personal history of other malignant neoplasm of bronchus and lung: Secondary | ICD-10-CM | POA: Insufficient documentation

## 2024-10-31 DIAGNOSIS — Z87891 Personal history of nicotine dependence: Secondary | ICD-10-CM | POA: Insufficient documentation

## 2024-10-31 DIAGNOSIS — R809 Proteinuria, unspecified: Secondary | ICD-10-CM | POA: Insufficient documentation

## 2024-10-31 DIAGNOSIS — Z79899 Other long term (current) drug therapy: Secondary | ICD-10-CM | POA: Diagnosis not present

## 2024-10-31 DIAGNOSIS — N184 Chronic kidney disease, stage 4 (severe): Secondary | ICD-10-CM | POA: Diagnosis not present

## 2024-10-31 LAB — CBC
HCT: 29.1 % — ABNORMAL LOW (ref 36.0–46.0)
Hemoglobin: 9.1 g/dL — ABNORMAL LOW (ref 12.0–15.0)
MCH: 27.4 pg (ref 26.0–34.0)
MCHC: 31.3 g/dL (ref 30.0–36.0)
MCV: 87.7 fL (ref 80.0–100.0)
Platelets: 206 K/uL (ref 150–400)
RBC: 3.32 MIL/uL — ABNORMAL LOW (ref 3.87–5.11)
RDW: 15.9 % — ABNORMAL HIGH (ref 11.5–15.5)
WBC: 7.1 K/uL (ref 4.0–10.5)
nRBC: 0 % (ref 0.0–0.2)

## 2024-10-31 LAB — PROTIME-INR
INR: 1.1 (ref 0.8–1.2)
Prothrombin Time: 14.5 s (ref 11.4–15.2)

## 2024-10-31 MED ORDER — MIDAZOLAM HCL (PF) 2 MG/2ML IJ SOLN
INTRAMUSCULAR | Status: AC | PRN
  Administered 2024-10-31: 1 mg via INTRAVENOUS
  Administered 2024-10-31: .5 mg via INTRAVENOUS

## 2024-10-31 MED ORDER — MIDAZOLAM HCL 2 MG/2ML IJ SOLN
INTRAMUSCULAR | Status: AC
  Filled 2024-10-31: qty 2

## 2024-10-31 MED ORDER — FENTANYL CITRATE (PF) 100 MCG/2ML IJ SOLN
INTRAMUSCULAR | Status: AC
  Filled 2024-10-31: qty 2

## 2024-10-31 MED ORDER — SODIUM CHLORIDE 0.9 % IV SOLN: INTRAVENOUS | Status: DC

## 2024-10-31 MED ORDER — HYDROCODONE-ACETAMINOPHEN 5-325 MG PO TABS: 1.0000 | ORAL_TABLET | ORAL | Status: DC | PRN

## 2024-10-31 MED ORDER — SODIUM CHLORIDE 0.9 % IV SOLN: Freq: Once | INTRAVENOUS | Status: DC

## 2024-10-31 MED ORDER — FENTANYL CITRATE (PF) 100 MCG/2ML IJ SOLN
INTRAMUSCULAR | Status: AC | PRN
  Administered 2024-10-31: 50 ug via INTRAVENOUS
  Administered 2024-10-31: 25 ug via INTRAVENOUS

## 2024-10-31 NOTE — Procedures (Signed)
" °  Procedure:  US  core RLP renal biopsy 18g x3 Preprocedure diagnosis: Diagnoses of Proteinuria of undiagnosed cause, CKD (chronic kidney disease) stage 4, GFR 15-29 ml/min (HCC), Hypertension, renal disease, and Elevated serum creatinine were pertinent to this visit. Postprocedure diagnosis: same EBL:    minimal Complications:   none immediate  See full dictation in [employer].  CHARM Toribio Faes MD Main # [phone] Pager  [phone] Mobile [phone]    "

## 2024-11-07 ENCOUNTER — Inpatient Hospital Stay

## 2024-11-07 ENCOUNTER — Inpatient Hospital Stay (HOSPITAL_BASED_OUTPATIENT_CLINIC_OR_DEPARTMENT_OTHER): Admitting: Internal Medicine

## 2024-11-07 ENCOUNTER — Inpatient Hospital Stay: Admitting: Dietician

## 2024-11-07 VITALS — BP 120/80 | HR 73 | Temp 100.9°F | Resp 17 | Ht 65.0 in | Wt 138.3 lb

## 2024-11-07 DIAGNOSIS — C3431 Malignant neoplasm of lower lobe, right bronchus or lung: Secondary | ICD-10-CM | POA: Diagnosis not present

## 2024-11-07 LAB — CBC WITH DIFFERENTIAL (CANCER CENTER ONLY)
Abs Immature Granulocytes: 0.01 K/uL (ref 0.00–0.07)
Basophils Absolute: 0 K/uL (ref 0.0–0.1)
Basophils Relative: 0 %
Eosinophils Absolute: 0.1 K/uL (ref 0.0–0.5)
Eosinophils Relative: 1 %
HCT: 27.8 % — ABNORMAL LOW (ref 36.0–46.0)
Hemoglobin: 8.9 g/dL — ABNORMAL LOW (ref 12.0–15.0)
Immature Granulocytes: 0 %
Lymphocytes Relative: 19 %
Lymphs Abs: 1.2 K/uL (ref 0.7–4.0)
MCH: 27 pg (ref 26.0–34.0)
MCHC: 32 g/dL (ref 30.0–36.0)
MCV: 84.2 fL (ref 80.0–100.0)
Monocytes Absolute: 0.9 K/uL (ref 0.1–1.0)
Monocytes Relative: 14 %
Neutro Abs: 4.2 K/uL (ref 1.7–7.7)
Neutrophils Relative %: 66 %
Platelet Count: 184 K/uL (ref 150–400)
RBC: 3.3 MIL/uL — ABNORMAL LOW (ref 3.87–5.11)
RDW: 15.9 % — ABNORMAL HIGH (ref 11.5–15.5)
WBC Count: 6.3 K/uL (ref 4.0–10.5)
nRBC: 0 % (ref 0.0–0.2)

## 2024-11-07 LAB — CMP (CANCER CENTER ONLY)
ALT: 15 U/L (ref 0–44)
AST: 26 U/L (ref 15–41)
Albumin: 3.7 g/dL (ref 3.5–5.0)
Alkaline Phosphatase: 70 U/L (ref 38–126)
Anion gap: 12 (ref 5–15)
BUN: 27 mg/dL — ABNORMAL HIGH (ref 8–23)
CO2: 21 mmol/L — ABNORMAL LOW (ref 22–32)
Calcium: 9.4 mg/dL (ref 8.9–10.3)
Chloride: 109 mmol/L (ref 98–111)
Creatinine: 2.47 mg/dL — ABNORMAL HIGH (ref 0.44–1.00)
GFR, Estimated: 19 mL/min — ABNORMAL LOW
Glucose, Bld: 99 mg/dL (ref 70–99)
Potassium: 3.5 mmol/L (ref 3.5–5.1)
Sodium: 142 mmol/L (ref 135–145)
Total Bilirubin: 0.5 mg/dL (ref 0.0–1.2)
Total Protein: 8.2 g/dL — ABNORMAL HIGH (ref 6.5–8.1)

## 2024-11-07 LAB — TSH: TSH: 1.35 u[IU]/mL (ref 0.350–4.500)

## 2024-11-07 NOTE — Progress Notes (Signed)
 "     [facility] [phone]) [phone]   [phone]  OFFICE PROGRESS NOTE  [name], MD [address] [place] [address]  DIAGNOSIS: stage IV (T4, N2, M1 B) non-small cell lung cancer, adenocarcinoma presented with large right infrahilar mass in addition to right hilar and mediastinal lymphadenopathy as well as left adrenal gland metastasis diagnosed in December 2024.   Biomarker Findings Tumor Mutational Burden - 24 Muts/Mb HRD signature - HRDsig Negative Microsatellite status - MS-Stable Genomic Findings For a complete list of the genes assayed, please refer to the Appendix. BRAF G464V KRAS G13C ASXL1 deletion exon 12 TP53 splice site 920-2A>T 6 Disease relevant genes with no reportable alterations: ALK, EGFR, ERBB2, MET, RET, ROS1  PDL1 Expression: 90%  PRIOR THERAPY: Systemic treatment with combination of chemoimmunotherapy with carboplatin  for AUC of 5, Alimta  500 Mg/M2 and Keytruda  200 Mg IV every 3 weeks.  First dose December 08, 2023.  Status post 15 cycles.  Starting from cycle #3 her dose of carboplatin  will be for AUC of 4 and Alimta  400 Mg/M2 secondary to pancytopenia.  Starting cycle #5 she was on maintenance treatment with Alimta  and Keytruda  every 3 weeks.  Starting from cycle #6 she is on treatment with single agent Keytruda  every 3 weeks.  Alimta  discontinued secondary to toxicity.  Last dose of Keytruda  was given on October 18, 2024 discontinued secondary to worsening renal function.  CURRENT THERAPY: Observation.  INTERVAL HISTORY: Tami Shea 78 y.o. female returns to the clinic today for follow-up visit accompanied by her daughter. Discussed the use of AI scribe software for clinical note transcription with the patient, who gave verbal consent to proceed.  History of Present Illness Tami Shea is a 78 year old female with stage IV non-small cell lung adenocarcinoma who presents for evaluation of acute kidney injury and  chemotherapy-induced gastrointestinal symptoms.  She was diagnosed with stage IV non-small cell lung adenocarcinoma in December 2024, characterized by high PD-1 expression and no actionable mutations. She has completed 15 cycles of systemic therapy, including Keytruda  and Alimta , with Alimta  discontinued due to renal toxicity. She is currently off active treatment pending further evaluation of renal dysfunction.  She presents with generalized pain, chills, nausea, and weakness. Nausea developed after oral intake yesterday, accompanied by decreased appetite and preference for fluids. Her daughter has provided Pedialyte for hydration. No fever was measured at home, but her temperature in clinic was 100.69F. She has mild nasal congestion without significant respiratory symptoms and denies influenza-like symptoms. She has received recent vaccination.  She continues to experience loose bowel movements, currently one to two episodes per [place], which is improved from prior frequency. Diarrhea is present but not severe, and she is maintaining hydration with oral fluids and liquid IV as advised.  She recently underwent a kidney biopsy. According to her daughter, the kidney doctor informed her that her kidney problems could be related to Keytruda  and possibly prior Alimta  exposure. She is not diabetic.    MEDICAL HISTORY: Past Medical History:  Diagnosis Date   Dyspnea    History of kidney stones    HTN (hypertension)    Smoker     ALLERGIES:  has no known allergies.  MEDICATIONS:  Current Outpatient Medications  Medication Sig Dispense Refill   amLODipine  (NORVASC ) 10 MG tablet Take 1 tablet (10 mg total) by mouth daily.     aspirin  EC 81 MG tablet Take 81 mg by mouth daily. Swallow whole. (Patient taking differently:  Take 81 mg by mouth in the morning and at bedtime. Swallow whole.)     Cholecalciferol (VITAMIN D3) 1.25 MG (50000 UT) CAPS Take 1 capsule by mouth once a week.     gatifloxacin   (ZYMAXID ) 0.5 % SOLN Place 1 drop into the left eye 3 (three) times daily. (Patient not taking: Reported on 10/31/2024)     hydrOXYzine (ATARAX) 25 MG tablet Take 25 mg by mouth 3 (three) times daily as needed.     ketorolac  (ACULAR ) 0.5 % ophthalmic solution Place 1 drop into the left eye 2 (two) times daily. (Patient not taking: Reported on 10/31/2024)     lidocaine -prilocaine  (EMLA ) cream Apply to affected area once 30 g 3   loperamide (IMODIUM) 2 MG capsule Take 2 mg by mouth as needed for diarrhea or loose stools (pt has been taking daily).     ondansetron  (ZOFRAN ) 8 MG tablet Take 1 tablet (8 mg total) by mouth every 8 (eight) hours as needed for nausea or vomiting. Start on the third Overall after carboplatin . 30 tablet 1   potassium chloride  SA (KLOR-CON  M) 20 MEQ tablet Take 1 tablet (20 mEq total) by mouth 2 (two) times daily. 14 tablet 0   potassium chloride  SA (KLOR-CON  M) 20 MEQ tablet Take 1 tablet (20 mEq total) by mouth 2 (two) times daily. 18 tablet 0   prochlorperazine  (COMPAZINE ) 10 MG tablet Take 1 tablet (10 mg total) by mouth every 6 (six) hours as needed for nausea or vomiting. 30 tablet 1   No current facility-administered medications for this visit.    SURGICAL HISTORY:  Past Surgical History:  Procedure Laterality Date   BRONCHIAL NEEDLE ASPIRATION BIOPSY  10/24/2023   Procedure: BRONCHIAL NEEDLE ASPIRATION BIOPSIES;  Surgeon: Brenna Adine CROME, DO;  Location: [facility];  Service: Pulmonary;;   COLONOSCOPY     INTRAMEDULLARY (IM) NAIL INTERTROCHANTERIC Right 07/27/2024   Procedure: FIXATION, FRACTURE, INTERTROCHANTERIC, WITH INTRAMEDULLARY ROD;  Surgeon: Fidel Rogue, MD;  Location: [facility];  Service: Orthopedics;  Laterality: Right;   IR IMAGING GUIDED PORT INSERTION  12/09/2023   PILONIDAL CYST EXCISION     tonisllectomy     VIDEO BRONCHOSCOPY WITH ENDOBRONCHIAL ULTRASOUND Right 10/24/2023   Procedure: VIDEO BRONCHOSCOPY WITH ENDOBRONCHIAL ULTRASOUND;  Surgeon:  Brenna Adine CROME, DO;  Location: [facility];  Service: Pulmonary;  Laterality: Right;    REVIEW OF SYSTEMS:  Constitutional: positive for fatigue and fevers Eyes: negative Ears, nose, mouth, throat, and face: negative Respiratory: negative Cardiovascular: negative Gastrointestinal: positive for diarrhea Genitourinary:negative Integument/breast: negative Hematologic/lymphatic: negative Musculoskeletal:negative Neurological: negative Behavioral/Psych: negative Endocrine: negative Allergic/Immunologic: negative   PHYSICAL EXAMINATION: General appearance: alert, cooperative, fatigued, and no distress Head: Normocephalic, without obvious abnormality, atraumatic Neck: no adenopathy, no JVD, supple, symmetrical, trachea midline, and thyroid  not enlarged, symmetric, no tenderness/mass/nodules Lymph nodes: Cervical, supraclavicular, and axillary nodes normal. Resp: clear to auscultation bilaterally Back: symmetric, no curvature. ROM normal. No CVA tenderness. Cardio: regular rate and rhythm, S1, S2 normal, no murmur, click, rub or gallop GI: soft, non-tender; bowel sounds normal; no masses,  no organomegaly Extremities: extremities normal, atraumatic, no cyanosis or edema Neurologic: Alert and oriented X 3, normal strength and tone. Normal symmetric reflexes. Normal coordination and gait  ECOG PERFORMANCE STATUS: 1 - Symptomatic but completely ambulatory  Blood pressure 120/80, pulse 73, temperature (!) 100.9 F (38.3 C), temperature source Temporal, resp. rate 17, height 5' 5 (1.651 m), weight 138 lb 4.8 oz (62.7 kg), SpO2 98%.  LABORATORY DATA: Lab Results  Component Value Date   WBC 6.3 11/07/2024   HGB 8.9 (L) 11/07/2024   HCT 27.8 (L) 11/07/2024   MCV 84.2 11/07/2024   PLT 184 11/07/2024      Chemistry      Component Value Date/Time   NA 142 10/18/2024 0950   NA 142 10/12/2021 1043   K 3.4 (L) 10/18/2024 0950   CL 108 10/18/2024 0950   CO2 23 10/18/2024 0950   BUN  26 (H) 10/18/2024 0950   BUN 17 10/12/2021 1043   CREATININE 2.34 (H) 10/18/2024 0950      Component Value Date/Time   CALCIUM 9.7 10/18/2024 0950   ALKPHOS 77 10/18/2024 0950   AST 31 10/18/2024 0950   ALT 16 10/18/2024 0950   BILITOT 0.5 10/18/2024 0950       RADIOGRAPHIC STUDIES: US  BIOPSY (KIDNEY) Result Date: 10/31/2024 EXAM: Ultrasound-guided random renal biopsy COMPARISON: CT 08/30/2024 CLINICAL HISTORY: CKD, hypertension, proteinuria. Previous biopsy of same target: No. Complications: No immediate complications. Conscious Sedation: 1.5 mg Versed  IV, 75 mcg fentanyl  IV. Sedation Technique: Under physician supervision, Versed  and fentanyl  were administered IV for moderate sedation. Pulse oximetry, heart rate, and blood pressure were continuously monitored with an independent trained observer present. Physician face-to-face sedation time: 13 minutes. Discussion: Time out performed including verification of patient, date of birth, procedure, and target as appropriate. Informed written consent was obtained. The site was prepared and draped using maximal sterile barrier technique including cutaneous antisepsis. The patient was positioned prone. Initial imaging was performed. Biopsy target: Lower pole kidney Laterality: Left. The right kidney was initially surveyed, but there is no window available by ultrasound that does not traverse peristalsing bowel. Local anesthesia was administered. Under ultrasound guidance, the guide needle was advanced to the margin of the target and coaxial biopsy was performed. Biopsy details: Biopsy type: Core Coaxial needle: 17G Core needle device: Biopince Core needle size: 18G Number of core specimens: 3 Fine needle aspiration performed: No Preliminary assessment of sample adequacy: Adequate The biopsy needle was removed and a sterile dressing was applied. Tract closure: None Post-procedure imaging: Immediate post-biopsy imaging: Ultrasound Post-biopsy imaging  findings: No hematoma or other apparent complication. Estimated blood loss: Less than 10 mL. Specimens sent for evaluation. IMPRESSION: 1. Successful ultrasound-guided left lower pole renal biopsy. 2. No immediate complications. Electronically signed by: Katheleen Faes MD 10/31/2024 02:19 PM EST RP Workstation: [id number]      ASSESSMENT AND PLAN: This is a very pleasant 78 years old African-American female with  stage IV (T4, N2, M1 B) non-small cell lung cancer, adenocarcinoma presented with large right infrahilar mass in addition to right hilar and mediastinal lymphadenopathy as well as left adrenal gland metastasis diagnosed in December 2024.  She had molecular studies by [facility] that showed no actionable mutations and PD-L1 TPS of 90%. She is currently undergoing treatment with chemoimmunotherapy with carboplatin  for AUC of 5, Alimta  500 Mg/M2 and Keytruda  200 Mg IV every 3 weeks for 4 cycles followed by maintenance treatment with Alimta  and Keytruda .  Status post 15 cycle.  Starting from cycle #3 her dose of carboplatin  was reduced to AUC of 4 and Alimta  400 Mg/M2 secondary to pancytopenia.  Starting cycle #6 she will be on single agent Keytruda  every 3 weeks.  Alimta  was discontinued secondary to intolerance and persistent pancytopenia.  Last dose was [facility] October 18, 2024. The patient has been tolerating this treatment fairly well with no concerning adverse effects except for the fatigue and diarrhea. 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 Assessment and Plan Assessment & Plan Stage IV non-small cell lung adenocarcinoma She has advanced lung adenocarcinoma without actionable mutations and high PD-1 expression. Immunotherapy has been held due to renal toxicity, as the risk of further kidney injury outweighs the potential benefit of continued therapy. Disease status will be reassessed based on upcoming imaging and renal function. - Held  immunotherapy (Keytruda ) due to acute kidney injury. - Scheduled follow-up imaging next month to assess disease status. - Planned follow-up visit in three weeks post-scan to review results and determine further management.  Acute kidney injury secondary to immunotherapy She developed acute kidney injury attributed to immunotherapy (Keytruda ) and possibly prior Alimta  exposure, confirmed by renal biopsy. Corticosteroids were initiated to promote renal recovery and improve appetite. - Held Keytruda  due to acute kidney injury. - Initiated Medrol  dose pack (methylprednisolone ).  Chemotherapy-induced gastrointestinal symptoms She experiences mild nausea and loose stools, likely related to prior chemotherapy and/or immunotherapy. Symptoms are currently manageable and do not require escalation of care. - Recommended continued oral hydration with Pedialyte and liquid IV. - Advised supportive care for gastrointestinal symptoms. The patient was advised to call immediately if she has any other concerning symptoms in the interval.   The patient voices understanding of current disease status and treatment options and is in agreement with the current care plan.  All questions were answered. The patient knows to call the clinic with any problems, questions or concerns. We can certainly see the patient much sooner if necessary.  The total time spent in the appointment was 30 minutes including review of chart and various tests results, discussions about plan of care and coordination of care plan .   Disclaimer: This note was dictated with voice recognition software. Similar sounding words can inadvertently be transcribed and may not be corrected upon review.        "

## 2024-11-08 LAB — T4: T4, Total: 6.7 ug/dL (ref 4.5–12.0)

## 2024-11-09 ENCOUNTER — Encounter (HOSPITAL_COMMUNITY): Payer: Self-pay

## 2024-11-09 ENCOUNTER — Other Ambulatory Visit (HOSPITAL_COMMUNITY)

## 2024-11-21 ENCOUNTER — Ambulatory Visit (HOSPITAL_COMMUNITY)
Admission: RE | Admit: 2024-11-21 | Discharge: 2024-11-21 | Disposition: A | Discharge status: Home / Self Care | Payer: Self-pay | Source: Ambulatory Visit | Attending: Cardiology | Admitting: Cardiology

## 2024-11-21 ENCOUNTER — Ambulatory Visit: Payer: Self-pay | Admitting: Cardiovascular Disease

## 2024-11-21 ENCOUNTER — Ambulatory Visit (HOSPITAL_COMMUNITY)
Admission: RE | Admit: 2024-11-21 | Discharge: 2024-11-21 | Disposition: A | Discharge status: Home / Self Care | Source: Ambulatory Visit | Attending: Cardiology | Admitting: Cardiology

## 2024-11-21 DIAGNOSIS — I10 Essential (primary) hypertension: Secondary | ICD-10-CM | POA: Insufficient documentation

## 2024-11-21 DIAGNOSIS — I3139 Other pericardial effusion (noninflammatory): Secondary | ICD-10-CM | POA: Diagnosis not present

## 2024-11-21 DIAGNOSIS — I2510 Atherosclerotic heart disease of native coronary artery without angina pectoris: Secondary | ICD-10-CM | POA: Diagnosis not present

## 2024-11-21 DIAGNOSIS — E782 Mixed hyperlipidemia: Secondary | ICD-10-CM | POA: Insufficient documentation

## 2024-11-21 DIAGNOSIS — R931 Abnormal findings on diagnostic imaging of heart and coronary circulation: Secondary | ICD-10-CM

## 2024-11-22 ENCOUNTER — Encounter: Payer: Self-pay | Admitting: Nephrology

## 2024-11-22 LAB — ECHOCARDIOGRAM COMPLETE
AR max vel: 2.52 cm2
AV Area VTI: 2.83 cm2
AV Area mean vel: 2.25 cm2
AV Mean grad: 6 mmHg
AV Peak grad: 9.7 mmHg
Ao pk vel: 1.56 m/s
Area-P 1/2: 2.9 cm2
P 1/2 time: 614 ms
S' Lateral: 4.7 cm

## 2024-11-23 LAB — SURGICAL PATHOLOGY

## 2024-11-29 ENCOUNTER — Inpatient Hospital Stay

## 2024-11-29 ENCOUNTER — Inpatient Hospital Stay: Attending: Internal Medicine | Admitting: Internal Medicine

## 2024-11-29 VITALS — BP 113/66 | HR 65 | Temp 98.1°F | Resp 17 | Ht 65.0 in | Wt 141.4 lb

## 2024-11-29 DIAGNOSIS — C3490 Malignant neoplasm of unspecified part of unspecified bronchus or lung: Secondary | ICD-10-CM

## 2024-11-29 DIAGNOSIS — C3431 Malignant neoplasm of lower lobe, right bronchus or lung: Secondary | ICD-10-CM

## 2024-11-29 LAB — CMP (CANCER CENTER ONLY)
ALT: 14 U/L (ref 0–44)
AST: 19 U/L (ref 15–41)
Albumin: 3.1 g/dL — ABNORMAL LOW (ref 3.5–5.0)
Alkaline Phosphatase: 46 U/L (ref 38–126)
Anion gap: 8 (ref 5–15)
BUN: 31 mg/dL — ABNORMAL HIGH (ref 8–23)
CO2: 26 mmol/L (ref 22–32)
Calcium: 8.4 mg/dL — ABNORMAL LOW (ref 8.9–10.3)
Chloride: 107 mmol/L (ref 98–111)
Creatinine: 1.62 mg/dL — ABNORMAL HIGH (ref 0.44–1.00)
GFR, Estimated: 32 mL/min — ABNORMAL LOW
Glucose, Bld: 119 mg/dL — ABNORMAL HIGH (ref 70–99)
Potassium: 4 mmol/L (ref 3.5–5.1)
Sodium: 141 mmol/L (ref 135–145)
Total Bilirubin: 0.4 mg/dL (ref 0.0–1.2)
Total Protein: 6.1 g/dL — ABNORMAL LOW (ref 6.5–8.1)

## 2024-11-29 LAB — CBC WITH DIFFERENTIAL (CANCER CENTER ONLY)
Abs Immature Granulocytes: 0.09 K/uL — ABNORMAL HIGH (ref 0.00–0.07)
Basophils Absolute: 0 K/uL (ref 0.0–0.1)
Basophils Relative: 0 %
Eosinophils Absolute: 0 K/uL (ref 0.0–0.5)
Eosinophils Relative: 0 %
HCT: 26.5 % — ABNORMAL LOW (ref 36.0–46.0)
Hemoglobin: 8.8 g/dL — ABNORMAL LOW (ref 12.0–15.0)
Immature Granulocytes: 1 %
Lymphocytes Relative: 8 %
Lymphs Abs: 1 K/uL (ref 0.7–4.0)
MCH: 28.5 pg (ref 26.0–34.0)
MCHC: 33.2 g/dL (ref 30.0–36.0)
MCV: 85.8 fL (ref 80.0–100.0)
Monocytes Absolute: 0.7 K/uL (ref 0.1–1.0)
Monocytes Relative: 6 %
Neutro Abs: 9.9 K/uL — ABNORMAL HIGH (ref 1.7–7.7)
Neutrophils Relative %: 85 %
Platelet Count: 133 K/uL — ABNORMAL LOW (ref 150–400)
RBC: 3.09 MIL/uL — ABNORMAL LOW (ref 3.87–5.11)
RDW: 19.4 % — ABNORMAL HIGH (ref 11.5–15.5)
WBC Count: 11.7 K/uL — ABNORMAL HIGH (ref 4.0–10.5)
nRBC: 0 % (ref 0.0–0.2)

## 2024-11-29 NOTE — Progress Notes (Signed)
 "     [name] [phone]   [phone]  OFFICE PROGRESS NOTE  [name], MD [address] [place] [address]  DIAGNOSIS: Stage IV (T4, N2, M1 B) non-small cell lung cancer, adenocarcinoma presented with large right infrahilar mass in addition to right hilar and mediastinal lymphadenopathy as well as left adrenal gland metastasis diagnosed in December 2024.   Biomarker Findings Tumor Mutational Burden - 24 Muts/Mb HRD signature - HRDsig Negative Microsatellite status - MS-Stable Genomic Findings For a complete list of the genes assayed, please refer to the Appendix. BRAF G464V KRAS G13C ASXL1 deletion exon 12 TP53 splice site 920-2A>T 6 Disease relevant genes with no reportable alterations: ALK, EGFR, ERBB2, MET, RET, ROS1  PDL1 Expression: 90%  PRIOR THERAPY: Systemic treatment with combination of chemoimmunotherapy with carboplatin  for AUC of 5, Alimta  500 Mg/M2 and Keytruda  200 Mg IV every 3 weeks.  First dose December 08, 2023.  Status post 15 cycles.  Starting from cycle #3 her dose of carboplatin  will be for AUC of 4 and Alimta  400 Mg/M2 secondary to pancytopenia.  Starting cycle #5 she was on maintenance treatment with Alimta  and Keytruda  every 3 weeks.  Starting from cycle #6 she is on treatment with single agent Keytruda  every 3 weeks.  Alimta  discontinued secondary to toxicity.  Last dose of Keytruda  was given on October 18, 2024 discontinued secondary to worsening renal function.  CURRENT THERAPY: Observation.  INTERVAL HISTORY: Tami Shea 79 y.o. female returns to the clinic today for follow-up visit accompanied by her daughter. Discussed the use of AI scribe software for clinical note transcription with the patient, who gave verbal consent to proceed.  History of Present Illness Tami Shea is a 79 year old female with stage 4 non-small cell lung adenocarcinoma who presents for evaluation of worsening cognitive impairment and  ongoing anemia.  She has stage 4 non-small cell lung adenocarcinoma with high PD-L1 expression and no actionable mutations. Initial therapy included carboplatin , pemetrexed , and pembrolizumab , followed by maintenance pemetrexed  and pembrolizumab  beginning with cycle 5. Pemetrexed  was discontinued after the last dose on October 18, 2024, due to worsening renal function and concern for nephritis. She has not received pemetrexed  since that time and is currently on prednisone, which has improved her appetite.  Over the past two months, she has developed progressive cognitive impairment characterized by episodes of confusion and forgetfulness, including difficulty with dialing phone numbers and performing simple tasks. Her daughter is concerned about her safety with activities such as cooking. She has not forgotten her daughter's name but did confuse the provider's name during the visit. She denies bleeding and reports feeling pretty good today, with increased appetite attributed to prednisone.  Recent cardiac imaging, including echocardiogram and CT scan, was performed for her heart doctor. Her recent lab results show that her serum creatinine decreased from 2.47 to 1.62 over the past two days. She continues to have anemia, but she and her daughter report no evidence of bleeding. She is accompanied by her daughter for support and additional history.     MEDICAL HISTORY: Past Medical History:  Diagnosis Date   Dyspnea    History of kidney stones    HTN (hypertension)    Smoker     ALLERGIES:  has no known allergies.  MEDICATIONS:  Current Outpatient Medications  Medication Sig Dispense Refill   amLODipine  (NORVASC ) 10 MG tablet Take 1 tablet (10 mg total) by mouth daily.     aspirin  EC  81 MG tablet Take 81 mg by mouth daily. Swallow whole. (Patient taking differently: Take 81 mg by mouth in the morning and at bedtime. Swallow whole.)     Cholecalciferol (VITAMIN D3) 1.25 MG (50000 UT) CAPS  Take 1 capsule by mouth once a week.     gatifloxacin  (ZYMAXID ) 0.5 % SOLN Place 1 drop into the left eye 3 (three) times daily. (Patient not taking: Reported on 10/31/2024)     hydrOXYzine (ATARAX) 25 MG tablet Take 25 mg by mouth 3 (three) times daily as needed.     ketorolac  (ACULAR ) 0.5 % ophthalmic solution Place 1 drop into the left eye 2 (two) times daily. (Patient not taking: Reported on 10/31/2024)     loperamide (IMODIUM) 2 MG capsule Take 2 mg by mouth as needed for diarrhea or loose stools (pt has been taking daily).     potassium chloride  SA (KLOR-CON  M) 20 MEQ tablet Take 1 tablet (20 mEq total) by mouth 2 (two) times daily. 14 tablet 0   potassium chloride  SA (KLOR-CON  M) 20 MEQ tablet Take 1 tablet (20 mEq total) by mouth 2 (two) times daily. 18 tablet 0   No current facility-administered medications for this visit.    SURGICAL HISTORY:  Past Surgical History:  Procedure Laterality Date   BRONCHIAL NEEDLE ASPIRATION BIOPSY  10/24/2023   Procedure: BRONCHIAL NEEDLE ASPIRATION BIOPSIES;  Surgeon: Brenna Adine CROME, DO;  Location: [facility];  Service: Pulmonary;;   COLONOSCOPY     INTRAMEDULLARY (IM) NAIL INTERTROCHANTERIC Right 07/27/2024   Procedure: FIXATION, FRACTURE, INTERTROCHANTERIC, WITH INTRAMEDULLARY ROD;  Surgeon: Fidel Rogue, MD;  Location: [facility];  Service: Orthopedics;  Laterality: Right;   IR IMAGING GUIDED PORT INSERTION  12/09/2023   PILONIDAL CYST EXCISION     tonisllectomy     VIDEO BRONCHOSCOPY WITH ENDOBRONCHIAL ULTRASOUND Right 10/24/2023   Procedure: VIDEO BRONCHOSCOPY WITH ENDOBRONCHIAL ULTRASOUND;  Surgeon: Brenna Adine CROME, DO;  Location: [facility];  Service: Pulmonary;  Laterality: Right;    REVIEW OF SYSTEMS:  A comprehensive review of systems was negative except for: Constitutional: positive for fatigue Neurological: positive for memory problems   PHYSICAL EXAMINATION: General appearance: alert, cooperative, fatigued, and no  distress Head: Normocephalic, without obvious abnormality, atraumatic Neck: no adenopathy, no JVD, supple, symmetrical, trachea midline, and thyroid  not enlarged, symmetric, no tenderness/mass/nodules Lymph nodes: Cervical, supraclavicular, and axillary nodes normal. Resp: clear to auscultation bilaterally Back: symmetric, no curvature. ROM normal. No CVA tenderness. Cardio: regular rate and rhythm, S1, S2 normal, no murmur, click, rub or gallop GI: soft, non-tender; bowel sounds normal; no masses,  no organomegaly Extremities: extremities normal, atraumatic, no cyanosis or edema  ECOG PERFORMANCE STATUS: 1 - Symptomatic but completely ambulatory  Blood pressure 113/66, pulse 65, temperature 98.1 F (36.7 C), temperature source Temporal, resp. rate 17, height 5' 5 (1.651 m), weight 141 lb 6.4 oz (64.1 kg), SpO2 100%.  LABORATORY DATA: Lab Results  Component Value Date   WBC 11.7 (H) 11/29/2024   HGB 8.8 (L) 11/29/2024   HCT 26.5 (L) 11/29/2024   MCV 85.8 11/29/2024   PLT 133 (L) 11/29/2024      Chemistry      Component Value Date/Time   NA 141 11/29/2024 1043   NA 142 10/12/2021 1043   K 4.0 11/29/2024 1043   CL 107 11/29/2024 1043   CO2 26 11/29/2024 1043   BUN 31 (H) 11/29/2024 1043   BUN 17 10/12/2021 1043   CREATININE 1.62 (H) 11/29/2024 1043  Component Value Date/Time   CALCIUM 8.4 (L) 11/29/2024 1043   ALKPHOS 46 11/29/2024 1043   AST 19 11/29/2024 1043   ALT 14 11/29/2024 1043   BILITOT 0.4 11/29/2024 1043       RADIOGRAPHIC STUDIES: CT CARDIAC SCORING (SELF PAY ONLY) Addendum Date: 11/23/2024 ADDENDUM REPORT: 11/23/2024 08:36 ADDENDUM: The following report is an over-read performed by radiologist Dr. Reyes Holder of [facility] on 11/23/2024. This over-read does not include interpretation of cardiac or coronary anatomy or pathology. The coronary calcium score interpretation by the cardiologist is attached. COMPARISON:  August 30, 2024  FINDINGS: Vascular: Similar cardiac enlargement with moderate to large pericardial effusion. Partially visualized central venous catheter. Mediastinum/Nodes: No pathologically enlarged mediastinal, or hilar lymph nodes, noting limited sensitivity for the detection of hilar adenopathy on this noncontrast study. Visualized portions of the esophagus are grossly unremarkable Lungs/Pleura: Stable small partially loculated right pleural effusion. Unchanged mass in the perihilar right lower lobe with heterogeneous internal fat attenuation most likely reflecting a hamartoma. Upper Abdomen: No acute abnormality. Musculoskeletal: Subcutaneous edema with skin thickening in the chest. Thoracic spondylosis. IMPRESSION: 1. Similar cardiac enlargement with moderate to large pericardial effusion. 2. Stable small partially loculated right pleural effusion. 3. Unchanged mass in the perihilar right lower lobe with heterogeneous internal fat attenuation most likely reflecting a hamartoma. 4. Subcutaneous edema with skin thickening in the chest, favored third-spacing. Electronically Signed   By: Reyes Holder M.D.   On: 11/23/2024 08:36   Result Date: 11/23/2024 CLINICAL DATA:  Cardiovascular Disease Risk stratification EXAM: Coronary Calcium Score TECHNIQUE: A gated, non-contrast computed tomography scan of the heart was performed using 3mm slice thickness. Axial images were analyzed on a dedicated workstation. Calcium scoring of the coronary arteries was performed using the Agatston method. FINDINGS: Coronary arteries: Normal origins. Coronary Calcium Score: Left main: 9.92 Left anterior descending artery: 2379 Left circumflex artery: 1712 Right coronary artery: 1202 Total: 5303 Percentile: 99th Pericardium: Normal. Ascending Aorta: Ascending aorta aneurysm measuring 4.5cm at the level of the main PA bifurcation. Recommend dedicated Chest CTA for further evaluation. Non-cardiac: See separate report from [facility].  IMPRESSION: Coronary calcium score of 5303. This was 99th percentile for age-, race-, and sex-matched controls. Ascending aorta aneurysm measuring 4.5cm at the level of the main PA bifurcation. Recommend dedicated Chest CTA for further evaluation. RECOMMENDATIONS: Coronary artery calcium (CAC) score is a strong predictor of incident coronary heart disease (CHD) and provides predictive information beyond traditional risk factors. CAC scoring is reasonable to use in the decision to withhold, postpone, or initiate statin therapy in intermediate-risk or selected borderline-risk asymptomatic adults (age 81-75 years and LDL-C >=70 to <190 mg/dL) who do not have diabetes or established atherosclerotic cardiovascular disease (ASCVD).* In intermediate-risk (10-year ASCVD risk >=7.5% to <20%) adults or selected borderline-risk (10-year ASCVD risk >=5% to <7.5%) adults in whom a CAC score is measured for the purpose of making a treatment decision the following recommendations have been made: If CAC=0, it is reasonable to withhold statin therapy and reassess in 5 to 10 years, as long as higher risk conditions are absent (diabetes mellitus, family history of premature CHD in first degree relatives (males <55 years; females <65 years), cigarette smoking, or LDL >=190 mg/dL). If CAC is 1 to 99, it is reasonable to initiate statin therapy for patients >=77 years of age. If CAC is >=100 or >=75th percentile, it is reasonable to initiate statin therapy at any age. Cardiology referral should be considered for patients with  CAC scores >=400 or >=75th percentile. *2018 AHA/ACC/AACVPR/AAPA/ABC/ACPM/ADA/AGS/APhA/ASPC/NLA/PCNA Guideline on the Management of Blood Cholesterol: A Report of the American College of Cardiology/American Heart Association Task Force on Clinical Practice Guidelines. J Am Coll Cardiol. 2019;73(24):3168-3209. Wilbert Bihari, MD Electronically Signed: By: Wilbert Bihari M.D. On: 11/21/2024 13:10   ECHOCARDIOGRAM  COMPLETE Result Date: 11/22/2024    ECHOCARDIOGRAM REPORT   Patient Name:   Tami Shea  Date of Exam: 11/21/2024 Medical Rec #:  [id number]     Height:       65.0 in Accession #:    [id number]    Weight:       138.3 lb Date of Birth:  18-Nov-1945     BSA:          1.691 m Patient Age:    78 years      BP:           136/70 mmHg Patient Gender: F             HR:           76 bpm. Exam Location:  [address] Procedure: 2D Echo, Cardiac Doppler and Color Doppler (Both Spectral and Color            Flow Doppler were utilized during procedure). Indications:    I31.39 Pericardial effusion!25.10 CAD  History:        Patient has no prior history of Echocardiogram examinations. CAD                 on CT, Lung cancer-immunotherapy and chemoptherapy; Risk                 Factors:Former Smoker, Dyslipidemia and Hypertension.  Sonographer:    Nolon Berg BA, RDCS Referring Phys: JONATHAN J BERRY IMPRESSIONS  1. Left ventricular ejection fraction, by estimation, is 35 to 40%. The left ventricle has moderately decreased function. The left ventricle demonstrates global hypokinesis. There is moderate left ventricular hypertrophy of the inferior segment. Left ventricular diastolic parameters are consistent with Grade I diastolic dysfunction (impaired relaxation).  2. Right ventricular systolic function is normal. The right ventricular size is normal.  3. A small pericardial effusion is present. The pericardial effusion is circumferential.  4. The mitral valve is normal in structure. Moderate to severe mitral valve regurgitation. No evidence of mitral stenosis.  5. The aortic valve is calcified. There is moderate calcification of the aortic valve. There is mild thickening of the aortic valve. Aortic valve regurgitation is moderate to severe. Aortic valve sclerosis is present, with no evidence of aortic valve stenosis. Aortic valve mean gradient measures 6.0 mmHg. Aortic valve Vmax measures 1.56 m/s.  6. Aortic dilatation noted.  There is mild dilatation of the ascending aorta, measuring 42 mm.  7. The inferior vena cava is normal in size with greater than 50% respiratory variability, suggesting right atrial pressure of 3 mmHg. FINDINGS  Left Ventricle: Left ventricular ejection fraction, by estimation, is 35 to 40%. The left ventricle has moderately decreased function. The left ventricle demonstrates global hypokinesis. The left ventricular internal cavity size was normal in size. There is moderate left ventricular hypertrophy of the inferior segment. Left ventricular diastolic parameters are consistent with Grade I diastolic dysfunction (impaired relaxation). Right Ventricle: The right ventricular size is normal. No increase in right ventricular wall thickness. Right ventricular systolic function is normal. Left Atrium: Left atrial size was normal in size. Right Atrium: Right atrial size was normal in size. Pericardium: A small pericardial effusion is  present. The pericardial effusion is circumferential. Mitral Valve: The mitral valve is normal in structure. Moderate to severe mitral valve regurgitation, with posteriorly-directed jet. No evidence of mitral valve stenosis. Tricuspid Valve: The tricuspid valve is normal in structure. Tricuspid valve regurgitation is not demonstrated. No evidence of tricuspid stenosis. Aortic Valve: The aortic valve is calcified. There is moderate calcification of the aortic valve. There is mild thickening of the aortic valve. Aortic valve regurgitation is moderate to severe. Aortic regurgitation PHT measures 614 msec. Aortic valve sclerosis is present, with no evidence of aortic valve stenosis. Aortic valve mean gradient measures 6.0 mmHg. Aortic valve peak gradient measures 9.7 mmHg. Aortic valve area, by VTI measures 2.83 cm. Pulmonic Valve: The pulmonic valve was normal in structure. Pulmonic valve regurgitation is not visualized. No evidence of pulmonic stenosis. Aorta: Aortic dilatation noted. There is  mild dilatation of the ascending aorta, measuring 42 mm. Venous: The inferior vena cava is normal in size with greater than 50% respiratory variability, suggesting right atrial pressure of 3 mmHg. IAS/Shunts: No atrial level shunt detected by color flow Doppler.  LEFT VENTRICLE PLAX 2D LVIDd:         5.40 cm   Diastology LVIDs:         4.70 cm   LV e' medial:    3.84 cm/s LV PW:         1.60 cm   LV E/e' medial:  24.0 LV IVS:        0.90 cm   LV e' lateral:   10.70 cm/s LVOT diam:     2.40 cm   LV E/e' lateral: 8.6 LV SV:         74 LV SV Index:   44 LVOT Area:     4.52 cm  RIGHT VENTRICLE          IVC RV Basal diam:  3.70 cm  IVC diam: 1.30 cm RV Mid diam:    2.60 cm LEFT ATRIUM             Index        RIGHT ATRIUM           Index LA diam:        4.20 cm 2.48 cm/m   RA Area:     25.50 cm LA Vol (A2C):   73.7 ml 43.58 ml/m  RA Volume:   71.90 ml  42.52 ml/m LA Vol (A4C):   52.6 ml 31.10 ml/m LA Biplane Vol: 63.2 ml 37.37 ml/m  AORTIC VALVE AV Area (Vmax):    2.52 cm AV Area (Vmean):   2.25 cm AV Area (VTI):     2.83 cm AV Vmax:           155.55 cm/s AV Vmean:          106.300 cm/s AV VTI:            0.261 m AV Peak Grad:      9.7 mmHg AV Mean Grad:      6.0 mmHg LVOT Vmax:         86.80 cm/s LVOT Vmean:        52.800 cm/s LVOT VTI:          0.163 m LVOT/AV VTI ratio: 0.63 AI PHT:            614 msec  AORTA Ao Root diam: 3.70 cm Ao Asc diam:  4.20 cm MITRAL VALVE MV Area (PHT): 2.90 cm     SHUNTS  MV Decel Time: 262 msec     Systemic VTI:  0.16 m MV E velocity: 92.35 cm/s   Systemic Diam: 2.40 cm MV A velocity: 142.50 cm/s MV E/A ratio:  0.65 Oneil Parchment MD Electronically signed by Oneil Parchment MD Signature Date/Time: 11/22/2024/1:25:39 PM    Final    US  BIOPSY (KIDNEY) Result Date: 10/31/2024 EXAM: Ultrasound-guided random renal biopsy COMPARISON: CT 08/30/2024 CLINICAL HISTORY: CKD, hypertension, proteinuria. Previous biopsy of same target: No. Complications: No immediate complications. Conscious  Sedation: 1.5 mg Versed  IV, 75 mcg fentanyl  IV. Sedation Technique: Under physician supervision, Versed  and fentanyl  were administered IV for moderate sedation. Pulse oximetry, heart rate, and blood pressure were continuously monitored with an independent trained observer present. Physician face-to-face sedation time: 13 minutes. Discussion: Time out performed including verification of patient, date of birth, procedure, and target as appropriate. Informed written consent was obtained. The site was prepared and draped using maximal sterile barrier technique including cutaneous antisepsis. The patient was positioned prone. Initial imaging was performed. Biopsy target: Lower pole kidney Laterality: Left. The right kidney was initially surveyed, but there is no window available by ultrasound that does not traverse peristalsing bowel. Local anesthesia was administered. Under ultrasound guidance, the guide needle was advanced to the margin of the target and coaxial biopsy was performed. Biopsy details: Biopsy type: Core Coaxial needle: 17G Core needle device: Biopince Core needle size: 18G Number of core specimens: 3 Fine needle aspiration performed: No Preliminary assessment of sample adequacy: Adequate The biopsy needle was removed and a sterile dressing was applied. Tract closure: None Post-procedure imaging: Immediate post-biopsy imaging: Ultrasound Post-biopsy imaging findings: No hematoma or other apparent complication. Estimated blood loss: Less than 10 mL. Specimens sent for evaluation. IMPRESSION: 1. Successful ultrasound-guided left lower pole renal biopsy. 2. No immediate complications. Electronically signed by: Katheleen Faes MD 10/31/2024 02:19 PM EST RP Workstation: [id number]      ASSESSMENT AND PLAN: This is a very pleasant 79 years old African-American female with  stage IV (T4, N2, M1 B) non-small cell lung cancer, adenocarcinoma presented with large right infrahilar mass in addition to right hilar  and mediastinal lymphadenopathy as well as left adrenal gland metastasis diagnosed in December 2024.  She had molecular studies by [facility] that showed no actionable mutations and PD-L1 TPS of 90%. She is currently undergoing treatment with chemoimmunotherapy with carboplatin  for AUC of 5, Alimta  500 Mg/M2 and Keytruda  200 Mg IV every 3 weeks for 4 cycles followed by maintenance treatment with Alimta  and Keytruda .  Status post 15 cycle.  Starting from cycle #3 her dose of carboplatin  was reduced to AUC of 4 and Alimta  400 Mg/M2 secondary to pancytopenia.  Starting cycle #6 she will be on single agent Keytruda  every 3 weeks.  Alimta  was discontinued secondary to intolerance and persistent pancytopenia.  Last dose was [facility] October 18, 2024. The patient has been tolerating this treatment fairly well with no concerning adverse effects except for the fatigue and diarrhea. 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 Assessment and Plan Assessment & Plan Stage IV non-small cell lung adenocarcinoma Advanced lung adenocarcinoma without actionable mutations and high PD-L1 expression. Previously treated with carboplatin , pemetrexed , and pembrolizumab ; maintenance pemetrexed  discontinued due to renal toxicity. New and worsening cognitive symptoms raise concern for CNS metastases and possible disease progression.  - Ordered MRI brain to evaluate for CNS metastases given new or worsening confusion. - Ordered CT scan (oncology protocol) to assess for disease  progression. - Held pemetrexed  unless imaging demonstrates progression. - Scheduled follow-up in two weeks to review imaging and reassess treatment plan.  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 Renal function improving with decreasing serum creatinine. Currently on prednisone, contributing to  renal recovery and improved appetite.  - Monitored renal function with repeat laboratory studies. - Continued prednisone as per nephrology recommendations.  Cognitive impairment Intermittent and worsening confusion, including forgetfulness and difficulty with routine tasks. MRI brain ordered to evaluate for intracranial disease. Steroid-induced neurocognitive effects discussed as a possible contributor. - Ordered MRI brain to assess for CNS metastases. - Provided anticipatory guidance regarding possible steroid-induced neurocognitive effects. - Prescribed lorazepam  for anxiolysis prior to MRI.  Anemia No evidence of active bleeding. - Assessed for bleeding and other etiologies during visit. The patient was advised to call immediately if she has any concerning symptoms in the interval.  The patient voices understanding of current disease status and treatment options and is in agreement with the current care plan.  All questions were answered. The patient knows to call the clinic with any problems, questions or concerns. We can certainly see the patient much sooner if necessary.  The total time spent in the appointment was 20 minutes including review of chart and various tests results, discussions about plan of care and coordination of care plan .   Disclaimer: This note was dictated with voice recognition software. Similar sounding words can inadvertently be transcribed and may not be corrected upon review.        "

## 2024-12-11 ENCOUNTER — Ambulatory Visit (HOSPITAL_COMMUNITY)
Admission: RE | Admit: 2024-12-11 | Discharge: 2024-12-11 | Disposition: A | Source: Ambulatory Visit | Attending: Internal Medicine | Admitting: Internal Medicine

## 2024-12-11 DIAGNOSIS — R9082 White matter disease, unspecified: Secondary | ICD-10-CM | POA: Diagnosis not present

## 2024-12-11 DIAGNOSIS — C3490 Malignant neoplasm of unspecified part of unspecified bronchus or lung: Secondary | ICD-10-CM

## 2024-12-11 DIAGNOSIS — Z8669 Personal history of other diseases of the nervous system and sense organs: Secondary | ICD-10-CM | POA: Diagnosis not present

## 2024-12-11 MED ORDER — GADOBUTROL 1 MMOL/ML IV SOLN
6.0000 mL | Freq: Once | INTRAVENOUS | Status: AC | PRN
  Administered 2024-12-11: 6 mL via INTRAVENOUS

## 2024-12-11 MED ORDER — HEPARIN SOD (PORK) LOCK FLUSH 100 UNIT/ML IV SOLN
500.0000 [IU] | Freq: Once | INTRAVENOUS | Status: AC
  Administered 2024-12-11: 500 [IU] via INTRAVENOUS

## 2024-12-26 ENCOUNTER — Other Ambulatory Visit (HOSPITAL_COMMUNITY)
